# Patient Record
Sex: Male | Born: 1955
Health system: Southern US, Community
[De-identification: ages and names within clinical notes are randomized; demographics above are authoritative.]

## PROBLEM LIST (undated history)

## (undated) DIAGNOSIS — Z836 Family history of other diseases of the respiratory system: Secondary | ICD-10-CM

## (undated) DIAGNOSIS — R0600 Dyspnea, unspecified: Secondary | ICD-10-CM

## (undated) DIAGNOSIS — E785 Hyperlipidemia, unspecified: Secondary | ICD-10-CM

## (undated) DIAGNOSIS — J189 Pneumonia, unspecified organism: Secondary | ICD-10-CM

## (undated) DIAGNOSIS — T7840XA Allergy, unspecified, initial encounter: Secondary | ICD-10-CM

## (undated) DIAGNOSIS — Z800 Family history of malignant neoplasm of digestive organs: Secondary | ICD-10-CM

## (undated) DIAGNOSIS — Z8042 Family history of malignant neoplasm of prostate: Secondary | ICD-10-CM

## (undated) DIAGNOSIS — J439 Emphysema, unspecified: Secondary | ICD-10-CM

## (undated) HISTORY — PX: FOREARM SURGERY: SHX651

## (undated) HISTORY — PX: HERNIA REPAIR: SHX51

## (undated) HISTORY — PX: NASAL SEPTUM SURGERY: SHX37

## (undated) HISTORY — PX: WRIST GANGLION EXCISION: SUR520

## (undated) HISTORY — DX: Family history of other diseases of the respiratory system: Z83.6

## (undated) HISTORY — DX: Family history of malignant neoplasm of prostate: Z80.42

## (undated) HISTORY — DX: Allergy, unspecified, initial encounter: T78.40XA

## (undated) HISTORY — DX: Emphysema, unspecified: J43.9

## (undated) HISTORY — PX: KNEE ARTHROTOMY: SHX5881

## (undated) HISTORY — DX: Family history of malignant neoplasm of digestive organs: Z80.0

---

## 2003-03-02 ENCOUNTER — Ambulatory Visit (HOSPITAL_COMMUNITY): Admission: RE | Admit: 2003-03-02 | Discharge: 2003-03-02 | Payer: Self-pay | Admitting: Gastroenterology

## 2006-08-06 ENCOUNTER — Ambulatory Visit (HOSPITAL_BASED_OUTPATIENT_CLINIC_OR_DEPARTMENT_OTHER): Admission: RE | Admit: 2006-08-06 | Discharge: 2006-08-06 | Payer: Self-pay | Admitting: *Deleted

## 2006-12-07 ENCOUNTER — Encounter: Admission: RE | Admit: 2006-12-07 | Discharge: 2006-12-07 | Payer: Self-pay | Admitting: Family Medicine

## 2010-08-19 NOTE — Op Note (Signed)
Shaun [name], Shaun [name]             ACCOUNT NO.:  [id number]   MEDICAL RECORD NO.:  [id number]          PATIENT TYPE:  AMB   LOCATION:  [facility]                         FACILITY:  [facility]   PHYSICIAN:  Jonne Ply, MD   DATE OF BIRTH:  [birth year]   DATE OF PROCEDURE:  08/06/2006  DATE OF DISCHARGE:                               OPERATIVE REPORT   PREOPERATIVE DIAGNOSIS:  Right inguinal hernia.   POSTOPERATIVE DIAGNOSIS:  Right inguinal hernia.   PROCEDURE:  Right inguinal hernia repair with mesh.   SURGEON:  Glean Hess, M.D.   ANESTHESIA:  General.   DESCRIPTION:  The patient was taken to the operating room and after  adequate general anesthesia was induced using endotracheal tube, the  abdomen and right groin were prepped and draped in normal sterile  fashion.  An oblique incision was made over the inguinal canal.  I  dissected down using Bovie electrocautery onto the external oblique  fascia.  This was opened down to the external ring.  Ilioinguinal nerve  was identified and preserved, retracted laterally.  The spermatic cord  was then surrounded with a Penrose drain at the level of the pubic  tubercle.  A direct hernia defect was easily visualized.  A relaxing  incision was made in the transversalis fascia.  A primary closure was  accomplished in a Lichtenstein fashion with interrupted 0-0 Surgilon  sutures.  [place] Atrium mesh was then placed 3 inches x 6 inches starting  at the pubic tubercle and split and brought out lateral to the internal  ring.  This was fixed to the Cooper's ligament and inguinal ligament  using running 2-0 Prolene suture into the transversalis fascia  superiorly.  This was fixed out laterally to the internal ring.  The  ilioinguinal nerve was returned to its normal anatomic position.  The  external oblique fascia was closed with a running 3-0 Vicryl suture.  Skin was closed with staples.  All tissues were injected with 0.5  Marcaine.  The patient  tolerated the procedure well went to PACU in good  condition.      Jonne Ply, MD  Electronically Signed     KRE/MEDQ  D:  08/06/2006  T:  08/06/2006  Job:  [id number]

## 2010-08-19 NOTE — Op Note (Signed)
NAME:  AHKEEM, GOEDE                       ACCOUNT NO.:  [id number]   MEDICAL RECORD NO.:  [id number]                   PATIENT TYPE:  AMB   LOCATION:  ENDO                                 FACILITY:  [facility]   PHYSICIAN:  Earle Gell, M.D.                DATE OF BIRTH:  February 23, 1956   DATE OF PROCEDURE:  DATE OF DISCHARGE:                                 OPERATIVE REPORT   PROCEDURE:  Colonoscopy and polypectomy.   PROCEDURE INDICATION:  Mr. Wendy Mikles is a 55 year old male born  March 03, 1956.  Mr. Meek' mother was diagnosed with colon cancer in  her late 58s.  Mr. Pipe is scheduled to undergo his first screening  colonoscopy with polypectomy to prevent colon cancer.   ENDOSCOPIST:  Earle Gell, M.D.   PREMEDICATION:  Versed 5 mg, Demerol 50 mg.   DESCRIPTION OF PROCEDURE:  After obtaining informed consent, Mr. Thrun was  placed in the left lateral decubitus position.  I administered intravenous  Demerol and intravenous Versed to achieve conscious sedation for the  procedure.  The patient's blood pressure, oxygen saturation, and cardiac  rhythm were monitored throughout the procedure and documented in the medical  record.   Anal inspection was normal, digital rectal exam was normal and the prostate  was non nodular.  The Olympus adjustable pediatric video colonoscope was  introduced into the rectum and advanced to the cecum.  A normal-appearing  ileocecal valve was intubated and the distal ileum inspected.  The colonic  preparation for the exam today was excellent.   Rectum:  Normal.   Sigmoid colon and descending colon:  Normal.   Splenic flexure:  Normal.   Transverse colon:  Normal.   Hepatic flexure:  Normal.   Ascending colon:  Normal.   Cecum and ileocecal valve:  Normal.   Distal ileum:  Normal.   ASSESSMENT:  Normal screening proctocolonoscopy to the cecum.   RECOMMENDATION:  Repeat colonoscopy in five years.                             Earle Gell, M.D.    MJ/MEDQ  D:  03/02/2003  T:  03/02/2003  Job:  [id number]   cc:   Osvaldo Human, M.D.  [address] [place]  [place] [address]  Fax: [phone]

## 2014-03-31 ENCOUNTER — Other Ambulatory Visit: Payer: Self-pay | Admitting: Family Medicine

## 2014-03-31 DIAGNOSIS — R1031 Right lower quadrant pain: Secondary | ICD-10-CM

## 2014-04-06 ENCOUNTER — Other Ambulatory Visit: Payer: Self-pay | Admitting: Family Medicine

## 2014-04-06 ENCOUNTER — Ambulatory Visit
Admission: RE | Admit: 2014-04-06 | Discharge: 2014-04-06 | Disposition: A | Discharge status: Home / Self Care | Payer: Self-pay | Source: Ambulatory Visit | Attending: Family Medicine | Admitting: Family Medicine

## 2014-04-06 DIAGNOSIS — R1031 Right lower quadrant pain: Secondary | ICD-10-CM

## 2014-04-23 ENCOUNTER — Other Ambulatory Visit: Payer: Self-pay | Admitting: Gastroenterology

## 2014-06-22 ENCOUNTER — Encounter (HOSPITAL_COMMUNITY): Payer: Self-pay

## 2014-06-22 ENCOUNTER — Ambulatory Visit (HOSPITAL_COMMUNITY): Admit: 2014-06-22 | Payer: Self-pay | Admitting: Gastroenterology

## 2014-06-22 SURGERY — COLONOSCOPY WITH PROPOFOL
Anesthesia: Monitor Anesthesia Care

## 2014-09-11 ENCOUNTER — Encounter (HOSPITAL_COMMUNITY): Payer: Self-pay | Admitting: *Deleted

## 2014-09-21 ENCOUNTER — Ambulatory Visit (HOSPITAL_COMMUNITY)
Admission: RE | Admit: 2014-09-21 | Discharge: 2014-09-21 | Disposition: A | Discharge status: Home / Self Care | Payer: BLUE CROSS/BLUE SHIELD | Source: Ambulatory Visit | Attending: Gastroenterology | Admitting: Gastroenterology

## 2014-09-21 ENCOUNTER — Ambulatory Visit (HOSPITAL_COMMUNITY): Payer: BLUE CROSS/BLUE SHIELD | Admitting: Certified Registered Nurse Anesthetist

## 2014-09-21 ENCOUNTER — Encounter (HOSPITAL_COMMUNITY): Payer: Self-pay | Admitting: *Deleted

## 2014-09-21 ENCOUNTER — Encounter (HOSPITAL_COMMUNITY)
Admission: RE | Disposition: A | Discharge status: Home / Self Care | Payer: Self-pay | Source: Ambulatory Visit | Attending: Gastroenterology

## 2014-09-21 DIAGNOSIS — D122 Benign neoplasm of ascending colon: Secondary | ICD-10-CM | POA: Diagnosis not present

## 2014-09-21 DIAGNOSIS — Z1211 Encounter for screening for malignant neoplasm of colon: Secondary | ICD-10-CM | POA: Insufficient documentation

## 2014-09-21 DIAGNOSIS — Z800 Family history of malignant neoplasm of digestive organs: Secondary | ICD-10-CM | POA: Insufficient documentation

## 2014-09-21 HISTORY — DX: Hyperlipidemia, unspecified: E78.5

## 2014-09-21 HISTORY — PX: COLONOSCOPY WITH PROPOFOL: SHX5780

## 2014-09-21 SURGERY — COLONOSCOPY WITH PROPOFOL
Anesthesia: Monitor Anesthesia Care

## 2014-09-21 MED ORDER — PROPOFOL 10 MG/ML IV BOLUS
INTRAVENOUS | Status: AC
  Filled 2014-09-21: qty 20

## 2014-09-21 MED ORDER — LACTATED RINGERS IV SOLN
INTRAVENOUS | Status: DC | PRN
  Administered 2014-09-21: 10:00:00 via INTRAVENOUS

## 2014-09-21 MED ORDER — PROPOFOL 10 MG/ML IV BOLUS
INTRAVENOUS | Status: DC | PRN
  Administered 2014-09-21 (×4): 40 mg via INTRAVENOUS
  Administered 2014-09-21: 60 mg via INTRAVENOUS
  Administered 2014-09-21: 20 mg via INTRAVENOUS

## 2014-09-21 MED ORDER — SODIUM CHLORIDE 0.9 % IV SOLN: INTRAVENOUS | Status: DC

## 2014-09-21 MED ORDER — LIDOCAINE HCL (CARDIAC) 20 MG/ML IV SOLN
INTRAVENOUS | Status: DC | PRN
  Administered 2014-09-21: 75 mg via INTRAVENOUS

## 2014-09-21 MED ORDER — LACTATED RINGERS IV SOLN
INTRAVENOUS | Status: DC
  Administered 2014-09-21: 1000 mL via INTRAVENOUS

## 2014-09-21 SURGICAL SUPPLY — 22 items

## 2014-09-21 NOTE — Anesthesia Postprocedure Evaluation (Signed)
  Anesthesia Post-op Note  Patient: Shaun [name]  Procedure(s) Performed: Procedure(s) (LRB): COLONOSCOPY WITH PROPOFOL (N/A)  Patient Location: PACU  Anesthesia Type: MAC  Level of Consciousness: awake and alert   Airway and Oxygen Therapy: Patient Spontanous Breathing  Post-op Pain: mild  Post-op Assessment: Post-op Vital signs reviewed, Patient's Cardiovascular Status Stable, Respiratory Function Stable, Patent Airway and No signs of Nausea or vomiting  Last Vitals:  Filed Vitals:   09/21/14 1120  BP: 130/77  Pulse: 88  Temp:   Resp: 16    Post-op Vital Signs: stable   Complications: No apparent anesthesia complications

## 2014-09-21 NOTE — Discharge Instructions (Signed)
Colonoscopy, Care After These instructions give you information on caring for yourself after your procedure. Your doctor may also give you more specific instructions. Call your doctor if you have any problems or questions after your procedure. HOME CARE  Do not drive for 24 hours.  Do not sign important papers or use machinery for 24 hours.  You may shower.  You may go back to your usual activities, but go slower for the first 24 hours.  Take rest breaks often during the first 24 hours.  Walk around or use warm packs on your belly (abdomen) if you have belly cramping or gas.  Drink enough fluids to keep your pee (urine) clear or pale yellow.  Resume your normal diet. Avoid heavy or fried foods.  Avoid drinking alcohol for 24 hours or as told by your doctor.  Only take medicines as told by your doctor. If a tissue sample (biopsy) was taken during the procedure:   Do not take aspirin or blood thinners for 7 days, or as told by your doctor.  Do not drink alcohol for 7 days, or as told by your doctor.  Eat soft foods for the first 24 hours. GET HELP IF: You still have a small amount of blood in your poop (stool) 2-3 days after the procedure. GET HELP RIGHT AWAY IF:  You have more than a small amount of blood in your poop.  You see clumps of tissue (blood clots) in your poop.  Your belly is puffy (swollen).  You feel sick to your stomach (nauseous) or throw up (vomit).  You have a fever.  You have belly pain that gets worse and medicine does not help. MAKE SURE YOU:  Understand these instructions.  Will watch your condition.  Will get help right away if you are not doing well or get worse. Document Released: 04/22/2010 Document Revised: 03/25/2013 Document Reviewed: 11/25/2012 [facility] Patient Information 2015 [place], [place]. This information is not intended to replace advice given to you by your health care provider. Make sure you discuss any questions you have with  your health care provider.

## 2014-09-21 NOTE — Op Note (Signed)
Procedure: Screening colonoscopy. Normal screening colonoscopy performed on 03/02/2003. Mother diagnosed with colon cancer at age 59.  Endoscopist: Earle Gell  Premedication: Propofol administered by anesthesia  Procedure: The patient was placed in left lateral decubitus position. Anal inspection and digital rectal exam were normal. The Pentax pediatric colonoscope was introduced into the rectum and advanced to the cecum. A normal-appearing appendiceal orifice and ileocecal valve were identified. Colonic preparation for the exam today was good. Withdrawal time was 16 minutes.  Rectum. Normal. Retroflexed view of the distal rectum was normal  Sigmoid colon and descending colon. Normal  Splenic flexure. Normal  Transverse colon. Normal  Hepatic flexure. Normal  Ascending colon. From the distal ascending colon, a 6 mm sessile serrated appearing polyp was lifted by saline injection and removed with the electrocautery snare. The post polypectomy site was closed with an Endo Clip.  Cecum and ileocecal valve. Normal  Assessment: A small polyp was removed from the distal ascending colon with the electrocautery. Otherwise normal colonoscopy.  Recommendation: Schedule repeat colonoscopy in 5 years

## 2014-09-21 NOTE — H&P (Signed)
  Procedure: Screening colonoscopy. Normal screening colonoscopy performed on 03/02/2003. Mother diagnosed with colon cancer at age 59  History: The patient is a 59 year old male born 22-Oct-1955. He is scheduled to undergo a screening colonoscopy with polypectomy to prevent colon cancer.  Past medical history: Hypercholesterolemia. Right inguinal hernia repair. Deviated septum surgery. Left knee surgery. Left wrist surgery.  Medication allergies: Penicillin causes rash  Exam: The patient is alert and lying comfortably on the endoscopy stretcher. Abdomen is soft and nontender to palpation. Lungs are clear to auscultation.  Plan: Proceed with screening colonoscopy

## 2014-09-21 NOTE — Transfer of Care (Signed)
Immediate Anesthesia Transfer of Care Note  Patient: Shaun [name]  Procedure(s) Performed: Procedure(s): COLONOSCOPY WITH PROPOFOL (N/A)  Patient Location: PACU and Endoscopy Unit  Anesthesia Type:MAC  Level of Consciousness: awake, alert , oriented and patient cooperative  Airway & Oxygen Therapy: Patient Spontanous Breathing and Patient connected to face mask oxygen  Post-op Assessment: Report given to RN, Post -op Vital signs reviewed and stable and Patient moving all extremities  Post vital signs: Reviewed and stable  Last Vitals:  Filed Vitals:   09/21/14 0852  Temp: 37 C  Resp: 10    Complications: No apparent anesthesia complications

## 2014-09-21 NOTE — Anesthesia Preprocedure Evaluation (Signed)
Anesthesia Evaluation  Patient identified by MRN, date of birth, ID band Patient awake    Reviewed: Allergy & Precautions, NPO status , Patient's Chart, lab work & pertinent test results  Airway Mallampati: II  TM Distance: >3 FB Neck ROM: Full    Dental no notable dental hx.    Pulmonary neg pulmonary ROS,  breath sounds clear to auscultation  Pulmonary exam normal       Cardiovascular negative cardio ROS Normal cardiovascular examRhythm:Regular Rate:Normal     Neuro/Psych negative neurological ROS  negative psych ROS   GI/Hepatic negative GI ROS, Neg liver ROS,   Endo/Other  negative endocrine ROS  Renal/GU negative Renal ROS  negative genitourinary   Musculoskeletal negative musculoskeletal ROS (+)   Abdominal   Peds negative pediatric ROS (+)  Hematology negative hematology ROS (+)   Anesthesia Other Findings   Reproductive/Obstetrics negative OB ROS                             Anesthesia Physical Anesthesia Plan  ASA: I  Anesthesia Plan: MAC   Post-op Pain Management:    Induction:   Airway Management Planned: Simple Face Mask  Additional Equipment:   Intra-op Plan:   Post-operative Plan:   Informed Consent: I have reviewed the patients History and Physical, chart, labs and discussed the procedure including the risks, benefits and alternatives for the proposed anesthesia with the patient or authorized representative who has indicated his/her understanding and acceptance.   Dental advisory given  Plan Discussed with: CRNA  Anesthesia Plan Comments:         Anesthesia Quick Evaluation

## 2014-09-22 ENCOUNTER — Encounter (HOSPITAL_COMMUNITY): Payer: Self-pay | Admitting: Gastroenterology

## 2015-05-05 ENCOUNTER — Ambulatory Visit
Admission: RE | Admit: 2015-05-05 | Discharge: 2015-05-05 | Disposition: A | Discharge status: Home / Self Care | Payer: BLUE CROSS/BLUE SHIELD | Source: Ambulatory Visit | Attending: Family Medicine | Admitting: Family Medicine

## 2015-05-05 ENCOUNTER — Other Ambulatory Visit: Payer: Self-pay | Admitting: Family Medicine

## 2015-05-05 DIAGNOSIS — R0989 Other specified symptoms and signs involving the circulatory and respiratory systems: Secondary | ICD-10-CM

## 2015-06-29 ENCOUNTER — Ambulatory Visit
Admission: RE | Admit: 2015-06-29 | Discharge: 2015-06-29 | Disposition: A | Discharge status: Home / Self Care | Payer: BLUE CROSS/BLUE SHIELD | Source: Ambulatory Visit | Attending: Family Medicine | Admitting: Family Medicine

## 2015-06-29 ENCOUNTER — Other Ambulatory Visit: Payer: Self-pay | Admitting: Family Medicine

## 2015-06-29 DIAGNOSIS — R0989 Other specified symptoms and signs involving the circulatory and respiratory systems: Secondary | ICD-10-CM

## 2015-07-01 ENCOUNTER — Other Ambulatory Visit: Payer: Self-pay | Admitting: Family Medicine

## 2015-07-01 DIAGNOSIS — J849 Interstitial pulmonary disease, unspecified: Secondary | ICD-10-CM

## 2015-07-08 ENCOUNTER — Ambulatory Visit
Admission: RE | Admit: 2015-07-08 | Discharge: 2015-07-08 | Disposition: A | Discharge status: Home / Self Care | Payer: BLUE CROSS/BLUE SHIELD | Source: Ambulatory Visit | Attending: Family Medicine | Admitting: Family Medicine

## 2015-07-08 DIAGNOSIS — R918 Other nonspecific abnormal finding of lung field: Secondary | ICD-10-CM | POA: Diagnosis not present

## 2015-07-08 DIAGNOSIS — J849 Interstitial pulmonary disease, unspecified: Secondary | ICD-10-CM

## 2015-07-08 MED ORDER — IOPAMIDOL (ISOVUE-300) INJECTION 61%
75.0000 mL | Freq: Once | INTRAVENOUS | Status: AC | PRN
  Administered 2015-07-08: 75 mL via INTRAVENOUS

## 2015-07-23 ENCOUNTER — Other Ambulatory Visit: Payer: BLUE CROSS/BLUE SHIELD

## 2015-07-23 ENCOUNTER — Encounter: Payer: Self-pay | Admitting: Emergency Medicine

## 2015-07-23 ENCOUNTER — Ambulatory Visit (INDEPENDENT_AMBULATORY_CARE_PROVIDER_SITE_OTHER): Payer: BLUE CROSS/BLUE SHIELD | Admitting: Emergency Medicine

## 2015-07-23 VITALS — BP 118/88 | HR 90 | Ht 67.0 in | Wt 153.0 lb

## 2015-07-23 DIAGNOSIS — J849 Interstitial pulmonary disease, unspecified: Secondary | ICD-10-CM | POA: Diagnosis not present

## 2015-07-23 DIAGNOSIS — J8409 Other alveolar and parieto-alveolar conditions: Secondary | ICD-10-CM

## 2015-07-23 DIAGNOSIS — J309 Allergic rhinitis, unspecified: Secondary | ICD-10-CM | POA: Diagnosis not present

## 2015-07-23 NOTE — Progress Notes (Signed)
Subjective:    Patient ID: Shaun [name], male    DOB: [birth year], 60 y.o.   MRN: [id number]  HPI 60 yo man, hx former tobacco (minimal hx), has worked as a Games developer. Has felt well.  He had rales on exam that prompted imaging.  CT scan of the chest performed on 07/08/15 shows interstitial lung disease with some areas of traction bronchiectasis, honeycomb change, most prominent at the apices and at both bases. There is a large left lower lobe bullous. Not clear to me that there is any active groundglass infiltrate. He also has some hilar and mediastinal lymphadenopathy.  Denies any SOB, still able to walk and exercise. Has a cough, he ascribes to allergies. No aspiration sx. No candidate prescription meds that would cause I:LD.  Of note his father had pulmonary fibrosis   Review of Systems  Constitutional: Negative for fever and unexpected weight change.  HENT: Positive for congestion and sneezing. Negative for dental problem, ear pain, nosebleeds, postnasal drip, rhinorrhea, sinus pressure, sore throat and trouble swallowing.   Eyes: Negative for redness and itching.  Respiratory: Negative for cough, chest tightness, shortness of breath and wheezing.   Cardiovascular: Negative for palpitations and leg swelling.  Gastrointestinal: Negative for nausea and vomiting.  Genitourinary: Negative for dysuria.  Musculoskeletal: Negative for joint swelling.  Skin: Negative for rash.  Neurological: Negative for headaches.  Hematological: Does not bruise/bleed easily.  Psychiatric/Behavioral: Negative for dysphoric mood. The patient is not nervous/anxious.     Past Medical History  Diagnosis Date  . Hyperlipemia      Family history His father had pulmonary fibrosis  Social History   Social History  . Marital Status: Married    Spouse Name: N/A  . Number of Children: N/A  . Years of Education: N/A   Occupational History  . Not on file.   Social History Main Topics  . Smoking status:  Former Research scientist (life sciences)  . Smokeless tobacco: Not on file  . Alcohol Use: No  . Drug Use: No  . Sexual Activity: Not on file   Other Topics Concern  . 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   Allergies  Allergen Reactions  . Penicillins     Childhood allergy     Outpatient Prescriptions Prior to Visit  Medication Sig Dispense Refill  . Multiple Vitamin (MULTIVITAMIN WITH MINERALS) TABS tablet Take 1 tablet by mouth daily.    . simvastatin (ZOCOR) 10 MG tablet Take 10 mg by mouth daily.   0  . chlorpheniramine (CHLOR-TRIMETON) 4 MG tablet Take 4 mg by mouth 2 (two) times daily as needed for allergies.    . pseudoephedrine (SUDAFED) 30 MG tablet Take 30 mg by mouth every 4 (four) hours as needed for congestion.     No facility-administered medications prior to visit.         Objective:   Physical Exam  Filed Vitals:   07/23/15 1522 07/23/15 1523  BP:  118/88  Pulse:  90  Height: _0  (1.702 m)   Weight: 153 lb (69.4 kg)   SpO2:  96%    Gen: Pleasant, well-nourished, in no distress,  normal affect  ENT: No lesions,  mouth clear,  oropharynx clear, no postnasal drip  Neck: No JVD, no TMG, no carotid bruits  Lungs: No use of accessory muscles, decreased at the left base with few inspiratory crackles, better  air movement on the right but with louder crackles halfway up the right lung field, no wheezes  Cardiovascular: RRR, heart sounds normal, no murmur or gallops, no peripheral edemal  Musculoskeletal: No deformities, no cyanosis or clubbing  Neuro: alert, non focal  Skin: Warm, no lesions or rashes      Assessment & Plan:  Interstitial lung disease ([place]) Etiology unclear. He denies any occupational exposures that typically cause ILD. He does not have any known prior medications that could be culprits. Certainly he may have a systemic inflammatory process that is presenting initially  with interstitial disease such as rheumatoid arthritis, scleroderma, lupus. He does not have any associated symptoms. No evidence for chronic aspiration. I discussed with him the potential for lung biopsy or possibly intrabronchial ultrasound to perform nodal biopsy. This could be sarcoidosis or hypersensitivity pneumonitis although again he has no specific exposures that I could elicit. I believe he needs screening autoimmune labs and pulmonary function testing. We will then review and decide whether it would be appropriate to pursue lung biopsy or nodal biopsies.   Baltazar Apo, MD, PhD 07/23/2015, 3:58 PM [facility] [phone] or if no answer [phone]

## 2015-07-23 NOTE — Patient Instructions (Signed)
Try starting loratadine 10 mg daily for your nasal congestion We will perform lab work and full breathing tests to evaluate the interstitial changes that we see on her CT scan of the chest.  Depending on the results of our evaluation we may decide to recommend lung biopsy or lymph node biopsy to further evaluate. We will arrange for follow-up visit with you at our next available opening after lab work is available.

## 2015-07-23 NOTE — Assessment & Plan Note (Signed)
Etiology unclear. He denies any occupational exposures that typically cause ILD. He does not have any known prior medications that could be culprits. Certainly he may have a systemic inflammatory process that is presenting initially with interstitial disease such as rheumatoid arthritis, scleroderma, lupus. He does not have any associated symptoms. No evidence for chronic aspiration. I discussed with him the potential for lung biopsy or possibly intrabronchial ultrasound to perform nodal biopsy. This could be sarcoidosis or hypersensitivity pneumonitis although again he has no specific exposures that I could elicit. I believe he needs screening autoimmune labs and pulmonary function testing. We will then review and decide whether it would be appropriate to pursue lung biopsy or nodal biopsies.

## 2015-07-23 NOTE — Assessment & Plan Note (Signed)
Try starting loratadine 10 mg daily

## 2015-07-25 LAB — RHEUMATOID FACTOR

## 2015-07-25 LAB — ANTI-JO 1 ANTIBODY, IGG: Anti JO-1: <0.2 AI (ref 0.0–0.9)

## 2015-07-26 LAB — ANA: Anti Nuclear Antibody(ANA): NEGATIVE

## 2015-07-26 LAB — ALDOLASE: Aldolase: 7 U/L (ref <=8.1)

## 2015-07-26 LAB — CYCLIC CITRUL PEPTIDE ANTIBODY, IGG: Cyclic Citrullin Peptide Ab: <16 Units

## 2015-07-26 LAB — SJOGREN'S SYNDROME ANTIBODS(SSA + SSB)
SSA (RO) (ENA) ANTIBODY, IGG: NEGATIVE
SSB (LA) (ENA) ANTIBODY, IGG: NEGATIVE

## 2015-07-26 LAB — RNP ANTIBODY: RIBONUCLEIC PROTEIN(ENA) ANTIBODY, IGG: NEGATIVE

## 2015-07-26 LAB — ANTI-SMITH ANTIBODY: ENA SM AB SER-ACNC: NEGATIVE

## 2015-07-26 LAB — ANTI-DNA ANTIBODY, DOUBLE-STRANDED: ds DNA Ab: 1 IU/mL

## 2015-07-26 LAB — ANTI-SCLERODERMA ANTIBODY: Scleroderma (Scl-70) (ENA) Antibody, IgG: <1

## 2015-07-29 LAB — ALPHA-1 ANTITRYPSIN PHENOTYPE: A-1 Antitrypsin: 144 mg/dL (ref 83–199)

## 2015-08-02 ENCOUNTER — Ambulatory Visit (HOSPITAL_COMMUNITY)
Admission: RE | Admit: 2015-08-02 | Discharge: 2015-08-02 | Disposition: A | Discharge status: Home / Self Care | Payer: BLUE CROSS/BLUE SHIELD | Source: Ambulatory Visit | Attending: Emergency Medicine | Admitting: Emergency Medicine

## 2015-08-02 DIAGNOSIS — J849 Interstitial pulmonary disease, unspecified: Secondary | ICD-10-CM | POA: Diagnosis not present

## 2015-08-02 LAB — PULMONARY FUNCTION TEST
DL/VA % PRED: 67 %
DL/VA: 2.99 ml/min/mmHg/L
DLCO UNC: 17.33 ml/min/mmHg
DLCO unc % pred: 61 %
FEF 25-75 POST: 2.64 L/s
FEF 25-75 Pre: 1.74 L/sec
FEF2575-%Change-Post: 51 %
FEF2575-%Pred-Post: 98 %
FEF2575-%Pred-Pre: 64 %
FEV1-%Change-Post: 42 %
FEV1-%PRED-PRE: 65 %
FEV1-%Pred-Post: 93 %
FEV1-POST: 3 L
FEV1-PRE: 2.1 L
FEV1FVC-%CHANGE-POST: 40 %
FEV1FVC-%Pred-Pre: 69 %
FEV6-%Change-Post: 2 %
FEV6-%Pred-Post: 98 %
FEV6-%Pred-Pre: 95 %
FEV6-POST: 3.99 L
FEV6-Pre: 3.89 L
FEV6FVC-%PRED-POST: 105 %
FEV6FVC-%PRED-PRE: 105 %
FVC-%Change-Post: 2 %
FVC-%PRED-PRE: 93 %
FVC-%Pred-Post: 95 %
FVC-POST: 4.08 L
FVC-PRE: 4 L
POST FEV6/FVC RATIO: 100 %
PRE FEV1/FVC RATIO: 53 %
PRE FEV6/FVC RATIO: 100 %
Post FEV1/FVC ratio: 74 %
RV % PRED: 92 %
RV: 1.92 L
TLC % pred: 91 %
TLC: 5.88 L

## 2015-08-02 MED ORDER — ALBUTEROL SULFATE (2.5 MG/3ML) 0.083% IN NEBU
2.5000 mg | INHALATION_SOLUTION | Freq: Once | RESPIRATORY_TRACT | Status: AC
  Administered 2015-08-02: 2.5 mg via RESPIRATORY_TRACT

## 2015-08-11 ENCOUNTER — Telehealth: Payer: Self-pay | Admitting: Emergency Medicine

## 2015-08-11 NOTE — Telephone Encounter (Signed)
Spoke with the pt  Looks like he just needs ov to discuss test results  Last AVS:   Patient Instructions     Try starting loratadine 10 mg daily for your nasal congestion We will perform lab work and full breathing tests to evaluate the interstitial changes that we see on her CT scan of the chest.  Depending on the results of our evaluation we may decide to recommend lung biopsy or lymph node biopsy to further evaluate. We will arrange for follow-up visit with you at our next available opening after lab work is available.   RB, you have no openings until end of June- ok to wait until then or overbook to see him? Please advise, thanks

## 2015-08-12 NOTE — Telephone Encounter (Signed)
Pt scheduled for 08/18/15 at 1:30. Nothing further needed.

## 2015-08-12 NOTE — Telephone Encounter (Signed)
Yes  Ok to [place] him thanks

## 2015-08-18 ENCOUNTER — Other Ambulatory Visit: Payer: BLUE CROSS/BLUE SHIELD

## 2015-08-18 ENCOUNTER — Encounter: Payer: Self-pay | Admitting: Emergency Medicine

## 2015-08-18 ENCOUNTER — Ambulatory Visit (INDEPENDENT_AMBULATORY_CARE_PROVIDER_SITE_OTHER): Payer: BLUE CROSS/BLUE SHIELD | Admitting: Emergency Medicine

## 2015-08-18 VITALS — BP 106/72 | HR 90 | Wt 152.0 lb

## 2015-08-18 DIAGNOSIS — J849 Interstitial pulmonary disease, unspecified: Secondary | ICD-10-CM

## 2015-08-18 DIAGNOSIS — J45909 Unspecified asthma, uncomplicated: Secondary | ICD-10-CM | POA: Diagnosis not present

## 2015-08-18 MED ORDER — ALBUTEROL SULFATE HFA 108 (90 BASE) MCG/ACT IN AERS: 2.0000 | INHALATION_SPRAY | Freq: Four times a day (QID) | RESPIRATORY_TRACT | Status: DC | PRN

## 2015-08-18 NOTE — Assessment & Plan Note (Signed)
Significant obstruction with bronchodilator response identified on his spirometry from his recent pulmonary function testing. He is asymptomatic but I do want him to learn to use albuterol when necessary.

## 2015-08-18 NOTE — Assessment & Plan Note (Signed)
There are scattered cystic changes and some evidence of bullous disease interspersed with his interstitial lung disease and apparent honeycomb change. These findings plus the obstruction on his palmar function testing by me wonder about a primary vasculitis or other cystic lung disease. Question whether this may be langerhans cell histocytosis versus, Churg-Strauss or vasculitis. He may ultimately require lung biopsy. Bronchoscopy may be indicated prior to more invasive testing. I will follow up ANCA, repeat his CT chest in 3 months, determine next steps.

## 2015-08-18 NOTE — Progress Notes (Signed)
Subjective:    Patient ID: Shaun [name], male    DOB: [birth year], 60 y.o.   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  He had rales on exam that prompted imaging.  CT scan of the chest performed on 07/08/15 shows interstitial lung disease with some areas of traction bronchiectasis, honeycomb change, most prominent at the apices and at both bases. There is a large left lower lobe bullous. Not clear to me that there is any active groundglass infiltrate. He also has some hilar and mediastinal lymphadenopathy.  Denies any SOB, still able to walk and exercise. Has a cough, he ascribes to allergies. No aspiration sx. No candidate prescription meds that would cause I:LD.  Of note his father had pulmonary fibrosis  ROV 08/18/15 -- follow-up visit for dyspnea that is associated with some traction bronchiectasis and UIP changes on CT scan of the chest as well as some areas of emphysematous change. He returns for follow-up. He had autoimmune serologies that I have reviewed which are all negative. Pulmonary function testing done on 08/02/15 shows moderately severe obstruction with a vigorous bronchodilator response, normal lung volumes and decreased diffusion capacity. He is able to exert, walk, ride bike.    Review of Systems  Constitutional: Negative for fever and unexpected weight change.  HENT: Positive for congestion and sneezing. Negative for dental problem, ear pain, nosebleeds, postnasal drip, rhinorrhea, sinus pressure, sore throat and trouble swallowing.   Eyes: Negative for redness and itching.  Respiratory: Negative for cough, chest tightness, shortness of breath and wheezing.   Cardiovascular: Negative for palpitations and leg swelling.  Gastrointestinal: Negative for nausea and vomiting.  Genitourinary: Negative for dysuria.  Musculoskeletal: Negative for joint swelling.  Skin: Negative for rash.  Neurological: Negative for  headaches.  Hematological: Does not bruise/bleed easily.  Psychiatric/Behavioral: Negative for dysphoric mood. The patient is not nervous/anxious.     Past Medical History  Diagnosis Date  . Hyperlipemia      Family history His father had pulmonary fibrosis  Social History   Social History  . Marital Status: Married    Spouse Name: N/A  . Number of Children: N/A  . Years of Education: N/A   Occupational History  . Not on file.   Social History Main Topics  . Smoking status: Never Smoker   . Smokeless tobacco: Not on file  . Alcohol Use: No  . Drug Use: No  . Sexual Activity: Not on file   Other Topics Concern  . 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   Allergies  Allergen Reactions  . Penicillins     Childhood allergy     Outpatient Prescriptions Prior to Visit  Medication Sig Dispense Refill  . aspirin 81 MG tablet Take 81 mg by mouth daily.    . Multiple Vitamin (MULTIVITAMIN WITH MINERALS) TABS tablet Take 1 tablet by mouth daily.    . simvastatin (ZOCOR) 10 MG tablet Take 10 mg by mouth daily.   0   No facility-administered medications prior to visit.         Objective:   Physical Exam  Filed Vitals:   08/18/15 1337  BP: 106/72  Pulse: 90  Weight: 152 lb (68.947 kg)  SpO2: 97%    Gen: Pleasant, well-nourished, in no distress,  normal affect  ENT:  No lesions,  mouth clear,  oropharynx clear, no postnasal drip  Neck: No JVD, no TMG, no carotid bruits  Lungs: No use of accessory muscles, decreased at the left base with few inspiratory crackles, better air movement on the right but with louder crackles halfway up the right lung field, no wheezes  Cardiovascular: RRR, heart sounds normal, no murmur or gallops, no peripheral edemal  Musculoskeletal: No deformities, no cyanosis or clubbing  Neuro: alert, non focal  Skin: Warm, no lesions or rashes        Assessment & Plan:  Asthma Significant obstruction with bronchodilator response identified on his spirometry from his recent pulmonary function testing. He is asymptomatic but I do want him to learn to use albuterol when necessary.   Interstitial lung disease ([place]) There are scattered cystic changes and some evidence of bullous disease interspersed with his interstitial lung disease and apparent honeycomb change. These findings plus the obstruction on his palmar function testing by me wonder about a primary vasculitis or other cystic lung disease. Question whether this may be langerhans cell histocytosis versus, Churg-Strauss or vasculitis. He may ultimately require lung biopsy. Bronchoscopy may be indicated prior to more invasive testing. I will follow up ANCA, repeat his CT chest in 3 months, determine next steps.    Baltazar Apo, MD, PhD 08/18/2015, 2:19 PM [facility] [phone] or if no answer [phone]

## 2015-08-18 NOTE — Patient Instructions (Addendum)
We will teach you how to use albuterol today. Use 2 puffs if needed for shortness of breath or prior to exertion to see if this helps your breathing. We will follow your blood work (ANCA) results We will repeat your CT scan of the chest in 3 months without contrast, high-resolution cuts Follow with Dr Lamonte Sakai in 4 months or sooner if you have any problems.

## 2015-08-19 LAB — RFLX P-ANCA TITER: P-ANCA: 1:20 {titer} — ABNORMAL HIGH

## 2015-08-19 LAB — ANCA SCREEN W REFLEX TITER: ANCA Screen: POSITIVE — AB

## 2015-11-13 DIAGNOSIS — Z23 Encounter for immunization: Secondary | ICD-10-CM | POA: Diagnosis not present

## 2015-11-22 ENCOUNTER — Other Ambulatory Visit: Payer: BLUE CROSS/BLUE SHIELD

## 2015-11-22 DIAGNOSIS — R7301 Impaired fasting glucose: Secondary | ICD-10-CM | POA: Diagnosis not present

## 2015-11-22 DIAGNOSIS — E7800 Pure hypercholesterolemia, unspecified: Secondary | ICD-10-CM | POA: Diagnosis not present

## 2015-11-22 DIAGNOSIS — J8489 Other specified interstitial pulmonary diseases: Secondary | ICD-10-CM | POA: Diagnosis not present

## 2015-11-23 ENCOUNTER — Ambulatory Visit
Admission: RE | Admit: 2015-11-23 | Discharge: 2015-11-23 | Disposition: A | Discharge status: Home / Self Care | Payer: BLUE CROSS/BLUE SHIELD | Source: Ambulatory Visit | Attending: Emergency Medicine | Admitting: Emergency Medicine

## 2015-11-23 DIAGNOSIS — J849 Interstitial pulmonary disease, unspecified: Secondary | ICD-10-CM | POA: Diagnosis not present

## 2015-11-26 ENCOUNTER — Encounter: Payer: Self-pay | Admitting: Emergency Medicine

## 2015-11-26 NOTE — Telephone Encounter (Signed)
HRCT done 8.22.17 to follow ILD Pt e-mail asking when he will be able to view the results from the aforementioned CT  Dr Lamonte Sakai please advise on results for pt, thank you.

## 2015-12-06 ENCOUNTER — Telehealth: Payer: Self-pay | Admitting: Emergency Medicine

## 2015-12-06 NOTE — Telephone Encounter (Signed)
Routing telephone note.

## 2015-12-06 NOTE — Telephone Encounter (Signed)
Call pt regarding CT chest, then address in Email

## 2015-12-07 NOTE — Telephone Encounter (Signed)
Spoke with the patient today. His CT scan of the chest shows continued basilar groundglass with some associated traction changes, mild honeycomb change, cystic areas that may be emphysema but may also reflect true cysts. He has a positive p-ANCA at low titer 1:20. I am concerned about GPA (Wegener's). I believe we need to confirm that his renal function is normal. If abnormal then he may be a candidate for renal biopsy. If normal then I think we need to strongly consider going for VATS lung biopsy. It may be that he would benefit from bronchoscopy first. I will discuss this with him and also with TCTS  Patient had labs done with Dr. Serita Grammes recently. We need to get a copy of these labs to check his renal function. Thanks

## 2015-12-08 NOTE — Telephone Encounter (Signed)
Called Dr. Raul Del office and spoke with Jerene Pitch. She is going to fax over the labs that the pt had drawn on 11/22/15. Will await fax.

## 2015-12-08 NOTE — Telephone Encounter (Signed)
These labs were received. His kidney function was normal >> creatinine 0.89.  Per RB >> we need to call the pt and let him know that I am thinking that he will need to have a lung biopsy done. He has an upcoming appointment with me on 12/28/2015. We will discuss all of this at that appointment.  Spoke with pt. He is aware of the above information. His appointment date and time have been clarified with him. Nothing further was needed.

## 2015-12-28 ENCOUNTER — Ambulatory Visit (INDEPENDENT_AMBULATORY_CARE_PROVIDER_SITE_OTHER): Payer: BLUE CROSS/BLUE SHIELD | Admitting: Emergency Medicine

## 2015-12-28 ENCOUNTER — Encounter: Payer: Self-pay | Admitting: Emergency Medicine

## 2015-12-28 DIAGNOSIS — J849 Interstitial pulmonary disease, unspecified: Secondary | ICD-10-CM | POA: Diagnosis not present

## 2015-12-28 NOTE — Progress Notes (Signed)
Subjective:    Patient ID: Shaun [name], male    DOB: 30-Aug-1955, 60 y.o.   MRN: [id number]  HPI 60 yo man, hx former tobacco (minimal hx), has worked as a Games developer. Has felt well.  He had rales on exam that prompted imaging.  CT scan of the chest performed on 07/08/15 shows interstitial lung disease with some areas of traction bronchiectasis, honeycomb change, most prominent at the apices and at both bases. There is a large left lower lobe bullous. Not clear to me that there is any active groundglass infiltrate. He also has some hilar and mediastinal lymphadenopathy.  Denies any SOB, still able to walk and exercise. Has a cough, he ascribes to allergies. No aspiration sx. No candidate prescription meds that would cause I:LD.  Of note his father had pulmonary fibrosis  ROV 60/17/17 -- follow-up visit for dyspnea that is associated with some traction bronchiectasis and UIP changes on CT scan of the chest as well as some areas of emphysematous change. He returns for follow-up. He had autoimmune serologies that I have reviewed which are all negative. Pulmonary function testing done on 08/02/15 shows moderately severe obstruction with a vigorous bronchodilator response, normal lung volumes and decreased diffusion capacity. He is able to exert, walk, ride bike.   ROV 12/28/15 -- Patient has a history of mixed obstruction and restrictive disease. He has some scattered cystic changes and bullous changes with possible honeycomb on CT scan of the chest. Based on this we checked autoimmune studies and his p-ANCA was positive. No evidence of renal disease on BMP. We repeated his CT scan of the chest on 11/23/15 and I have personally reviewed this. Shows continued evidence for peripheral fibrotic changes, early honeycomb change, associated groundglass. Also noted are some subpleural nodular changes that have not changed since last study.   Review of Systems  Constitutional: Negative for fever and unexpected weight  change.  HENT: Positive for congestion and sneezing. Negative for dental problem, ear pain, nosebleeds, postnasal drip, rhinorrhea, sinus pressure, sore throat and trouble swallowing.   Eyes: Negative for redness and itching.  Respiratory: Negative for cough, chest tightness, shortness of breath and wheezing.   Cardiovascular: Negative for palpitations and leg swelling.  Gastrointestinal: Negative for nausea and vomiting.  Genitourinary: Negative for dysuria.  Musculoskeletal: Negative for joint swelling.  Skin: Negative for rash.  Neurological: Negative for headaches.  Hematological: Does not bruise/bleed easily.  Psychiatric/Behavioral: Negative for dysphoric mood. The patient is not nervous/anxious.     Past Medical History:  Diagnosis Date  . Hyperlipemia      Family history His father had pulmonary fibrosis  Social History   Social History  . Marital status: Married    Spouse name: N/A  . Number of children: N/A  . Years of education: N/A   Occupational History  . Not on file.   Social History Main Topics  . Smoking status: Never Smoker  . Smokeless tobacco: Not on file  . Alcohol use No  . Drug use: No  . Sexual activity: Not on file   Other Topics Concern  . Not on file   Social History Narrative  . 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  No [employer]  Now works office job.   Allergies  Allergen Reactions  . Penicillins     Childhood allergy     Outpatient Medications Prior to Visit  Medication Sig Dispense Refill  . albuterol (PROVENTIL HFA;VENTOLIN HFA)  108 (90 Base) MCG/ACT inhaler Inhale 2 puffs into the lungs every 6 (six) hours as needed for wheezing or shortness of breath. 1 Inhaler 5  . aspirin 81 MG tablet Take 81 mg by mouth daily.    . cetirizine (ZYRTEC ALLERGY) 10 MG tablet Take 10 mg by mouth daily.    . Multiple Vitamin (MULTIVITAMIN WITH MINERALS) TABS tablet Take 1 tablet by mouth daily.    . simvastatin  (ZOCOR) 10 MG tablet Take 10 mg by mouth daily.   0   No facility-administered medications prior to visit.          Objective:   Physical Exam  Vitals:   12/28/15 0943  BP: 118/82  Pulse: 81  SpO2: 96%  Weight: 155 lb 6.4 oz (70.5 kg)  Height: _0  (1.702 m)    Gen: Pleasant, well-nourished, in no distress,  normal affect  ENT: No lesions,  mouth clear,  oropharynx clear, no postnasal drip  Neck: No JVD, no TMG, no carotid bruits  Lungs: No use of accessory muscles, decreased at the left base with few inspiratory crackles, better air movement on the right but with louder crackles halfway up the right lung field, no wheezes  Cardiovascular: RRR, heart sounds normal, no murmur or gallops, no peripheral edemal  Musculoskeletal: No deformities, no cyanosis or clubbing  Neuro: alert, non focal  Skin: Warm, no lesions or rashes      Assessment & Plan:  Interstitial lung disease ([place]) Persists and has possibly since prior CT scan. p-ANCA is positive. I believe he needs a VATS bx in absence of any evidence for renal manifestations GPA. Will refer him to [facility]CTS  Baltazar Apo, MD, PhD 12/28/2015, 10:14 AM [facility] [phone] or if no answer [phone]

## 2015-12-28 NOTE — Patient Instructions (Signed)
We will refer you to see a thoracic surgeon to discuss a possible lung biopsy  Please follow with Dr Lamonte Sakai next available after your lung procedure.

## 2015-12-28 NOTE — Addendum Note (Signed)
Addended by: Mathis Dad on: 12/28/2015 10:17 AM   Modules accepted: Orders

## 2015-12-28 NOTE — Assessment & Plan Note (Signed)
Persists and has possibly since prior CT scan. p-ANCA is positive. I believe he needs a VATS bx in absence of any evidence for renal manifestations GPA. Will refer him to TCTS

## 2016-01-04 ENCOUNTER — Encounter: Payer: BLUE CROSS/BLUE SHIELD | Admitting: Thoracic Surgery (Cardiothoracic Vascular Surgery)

## 2016-01-05 ENCOUNTER — Institutional Professional Consult (permissible substitution) (INDEPENDENT_AMBULATORY_CARE_PROVIDER_SITE_OTHER): Payer: BLUE CROSS/BLUE SHIELD | Admitting: Thoracic Surgery (Cardiothoracic Vascular Surgery)

## 2016-01-05 ENCOUNTER — Other Ambulatory Visit: Payer: Self-pay | Admitting: *Deleted

## 2016-01-05 ENCOUNTER — Encounter: Payer: Self-pay | Admitting: Thoracic Surgery (Cardiothoracic Vascular Surgery)

## 2016-01-05 VITALS — BP 134/92 | HR 98 | Resp 16 | Ht 67.0 in | Wt 150.0 lb

## 2016-01-05 DIAGNOSIS — J849 Interstitial pulmonary disease, unspecified: Secondary | ICD-10-CM

## 2016-01-05 NOTE — Progress Notes (Signed)
PCP is Mayra Neer, MD Referring Provider is Collene Gobble, MD  Chief Complaint  Patient presents with  . Interstitial Lung Disease    eval for lung BX.Marland KitchenMarland KitchenCT CHEST 11/23/15    HPI: 60 year old man sent for consultation regarding a lung biopsy.  Shaun [name] is a 60 year old man with a past medical history significant for hyperlipidemia. Shaun [name] saw Dr. Brigitte Pulse in February for an annual exam. She noted abnormal of breath sounds in order to chest x-ray. Chest x-ray was abnormal as well as a follow-up. Shaun [name] was referred to Dr. Lamonte [name] who did a CT of the chest. It showed changes consistent with interstitial lung disease.  Shaun [name] recently had a follow-up CT which suggested mild interval progression. Pulmonary function testing showed some obstruction but an FEV1 of 2.100 (65%) which increased to 3.00 (93%).  Overall Shaun [name] feels fairly well. His appetite is good. Shaun [name] has not lost any weight. Shaun [name] has an occasional cough, but nothing out of the ordinary. Shaun [name] denies any wheezing and has not been using his rescue inhaler. Shaun [name] denies any chest pain, pressure, or tightness with exertion. Shaun [name] walked up to 4 flights of stairs to our office and did say Shaun [name] was short of breath when Shaun [name] got to the top.  Past Medical History:  Diagnosis Date  . Hyperlipemia     Past Surgical History:  Procedure Laterality Date  . COLONOSCOPY WITH PROPOFOL N/A 09/21/2014   Procedure: COLONOSCOPY WITH PROPOFOL;  Surgeon: Garlan Fair, MD;  Location: [facility];  Service: Endoscopy;  Laterality: N/A;  . FOREARM SURGERY     excision of birth mark"child"  . HERNIA REPAIR     RIH-open  . KNEE ARTHROTOMY Left    bursa excision  . NASAL SEPTUM SURGERY    . WRIST GANGLION EXCISION Left     Family history Father died of pulmonary fibrosis in his 57s Grandfather had emphysema Mother died of lung cancer  Social History Social History  Substance Use Topics  . Smoking status: Never Smoker  . Smokeless tobacco: Not on file  .  Alcohol use No    Current Outpatient Prescriptions  Medication Sig Dispense Refill  . albuterol (PROVENTIL HFA;VENTOLIN HFA) 108 (90 Base) MCG/ACT inhaler Inhale 2 puffs into the lungs every 6 (six) hours as needed for wheezing or shortness of breath. 1 Inhaler 5  . aspirin 81 MG tablet Take 81 mg by mouth daily.    . cetirizine (ZYRTEC ALLERGY) 10 MG tablet Take 10 mg by mouth daily.    . chlorpheniramine (CHLOR-TRIMETON) 2 MG/5ML syrup Take 2 mg by mouth every 4 (four) hours as needed for allergies.    . Multiple Vitamin (MULTIVITAMIN WITH MINERALS) TABS tablet Take 1 tablet by mouth daily.    . pseudoephedrine (SUDAFED) 60 MG tablet Take 60 mg by mouth every 4 (four) hours as needed for congestion.    . simvastatin (ZOCOR) 10 MG tablet Take 10 mg by mouth daily.   0   No current facility-administered medications for this visit.     Allergies  Allergen Reactions  . Penicillins     Childhood allergy    Review of Systems  Constitutional: Negative for activity change, appetite change, chills, fever and unexpected weight change.  HENT: Negative for voice change.   Eyes: Negative for visual disturbance.  Respiratory: Negative for cough, shortness of breath and wheezing.   Cardiovascular: Negative for chest pain and leg swelling.  Gastrointestinal: Negative for abdominal pain and blood in stool.  Genitourinary:  Negative for difficulty urinating and dysuria.  Musculoskeletal: Negative for arthralgias and joint swelling.  Neurological: Negative for seizures, syncope and weakness.  Hematological: Negative for adenopathy. Does not bruise/bleed easily.  All other systems reviewed and are negative.   BP (!) 134/92   Pulse 98   Resp 16   Ht _0  (1.702 m)   Wt 150 lb (68 kg)   SpO2 97% Comment: ON RA  BMI 23.49 kg/m  Physical Exam  Constitutional: Shaun [name] is oriented to person, place, and time. Shaun [name] appears well-developed and well-nourished. No distress.  HENT:  Head: Normocephalic and  atraumatic.  Mouth/Throat: No oropharyngeal exudate.  Eyes: Conjunctivae and EOM are normal. No scleral icterus.  Neck: Neck supple. No thyromegaly present.  Cardiovascular: Normal rate, regular rhythm, normal heart sounds and intact distal pulses.  Exam reveals no gallop and no friction rub.   No murmur heard. Pulmonary/Chest: Effort normal. No respiratory distress. Shaun [name] has rales (bibasilar, right greater than left).  Abdominal: Soft. Shaun [name] exhibits no distension. There is no tenderness.  Musculoskeletal: Shaun [name] exhibits no edema.  Lymphadenopathy:    Shaun [name] has no cervical adenopathy.  Neurological: Shaun [name] is alert and oriented to person, place, and time. No cranial nerve deficit.  No focal motor deficit  Skin: Skin is warm and dry.  Vitals reviewed.    Diagnostic Tests: CT CHEST WITHOUT CONTRAST  TECHNIQUE: Multidetector CT imaging of the chest was performed following the standard protocol without intravenous contrast. High resolution imaging of the lungs, as well as inspiratory and expiratory imaging, was performed.  COMPARISON:  07/08/2015 chest CT.  FINDINGS: Motion degraded scan.  Mediastinum/Nodes: Normal heart size. No significant pericardial fluid/thickening. Great vessels are normal in course and caliber. No discrete thyroid nodules. Unremarkable esophagus. No pathologically enlarged axillary, mediastinal or gross hilar lymph nodes, noting limited sensitivity for the detection of hilar adenopathy on this noncontrast study. Top-normal caliber right hilar, AP window and subcarinal nodes are stable.  Lungs/Pleura: No pneumothorax. No pleural effusion. Mild centrilobular and paraseptal emphysema. Posterior left lower lobe 5.9 cm bulla, not appreciably changed. No acute consolidative airspace disease. Subpleural 5 mm anterior right lower lobe solid pulmonary nodule (series 4/ image 67), stable. No lung masses or new significant pulmonary nodules. There is patchy confluent  subpleural reticulation, traction bronchiectasis and architectural distortion throughout both lungs with a posterior basilar predominance. There is probable early honeycombing in the posterior lower lobes bilaterally. There is a suggestion of mild interval progression of these findings. No significant air trapping on the expiration sequence.  Upper abdomen: Hypodense 0.4 cm right liver dome lesion, too small to characterize, unchanged, probably benign. Simple 2.4 cm upper left renal cyst.  Musculoskeletal: No aggressive appearing focal osseous lesions. Mild thoracic spondylosis.  IMPRESSION: 1. Suggestion of mild interval progression of peripheral basilar predominant fibrotic interstitial lung disease characterized by patchy confluent subpleural reticulation, traction bronchiectasis, architectural distortion and probable early honeycombing, most consistent with usual interstitial pneumonia (UIP). Follow-up high-resolution chest CT can be considered in 12 months to assess temporal pattern stability. 2. Subpleural 5 mm right lower lobe solid pulmonary nodule, for which 4 month stability has been demonstrated, probably benign. Non-contrast chest CT can be considered in 12 months if patient is high-risk. This recommendation follows the consensus statement: Guidelines for Management of Incidental Pulmonary Nodules Detected on CT Images:From the Fleischner Society 2017; published online before print (10.1148/radiol.6256389373). 3. Mild centrilobular and paraseptal emphysema and left lower lobe bullous emphysema. 4. No thoracic adenopathy.   Electronically  Signed   By: Ilona Sorrel M.D.   On: 11/23/2015 16:02  I personally reviewed the CT chest and concur with the findings noted above.  Pulmonary function testing FVC 4.00 (93%) FEV1 2.10 (65%) FEV1 3.00 (93%) post bronchodilator DLCO 17.33 (61%)  Impression: Shaun [name] is a 60 year old man with interstitial lung  disease. Shaun [name] is minimally symptomatic, if at all, but has very impressive changes on CT. Dr. Lamonte [name] feels that a surgical lung biopsy is needed to determine the underlying disease process and guide therapy. I concur with his assessment.  I described the proposed procedure Shaun [name]. Shaun [name] understands that this would be a surgical procedure which would involve a hospital stay and a posthospital recovery phase. I informed him of the general nature of the proposed procedure which is right VATS for lung biopsy. Shaun [name] understands would be done under general anesthesia, the incisions be used, the use of a chest tube postoperatively, the expected hospital stay, and the overall recovery. I reviewed the indications, risks, benefits, and alternatives. Shaun [name] understands the risks include, but are not limited to death (less than 1%), MI, DVT, PE, infection, stroke, bleeding, possible need for transfusion, prolonged air leak, cardiac arrhythmias, as well as the possibility of other unforeseeable complications. Shaun [name] understands the procedure is diagnostic and not therapeutic.  Shaun [name] seemed a little uncertain, but tentatively wished to proceed on Wednesday, November 1. If that date does not work out for him to call the office and we can reschedule at his convenience.  Plan: Right VATS, lung biopsy Wed 02/02/2016  [name], MD [facility] [phone]

## 2016-01-28 NOTE — Pre-Procedure Instructions (Signed)
Shaun [name]  01/28/2016      [id number] - [place], [place] - [place] [place] [address] Phone: [phone] Fax: [phone]    Your procedure is scheduled on 02/02/16.  Report to [facility] Admitting at 630 A.M.  Call this number if you have problems the morning of surgery:  [phone]   Remember:  Do not eat food or drink liquids after midnight.  Take these medicines the morning of surgery with A SIP OF WATER --all inhalers   Do not wear jewelry, make-up or nail polish.  Do not wear lotions, powders, or perfumes, or deoderant.  Do not shave 48 hours prior to surgery.  Men may shave face and neck.  Do not bring valuables to the hospital.  [facility] is not responsible for any belongings or valuables.  Contacts, dentures or bridgework may not be worn into surgery.  Leave your suitcase in the car.  After surgery it may be brought to your room.  For patients admitted to the hospital, discharge time will be determined by your treatment team.  Patients discharged the day of surgery will not be allowed to drive home.   Name and phone number of your driver:    Special instructions: Do not take any aspirin,anti-inflammatories,vitamins,or herbal supplements 5-7 days prior to surgery.  Please read over the following fact sheets that you were given. MRSA Information

## 2016-01-31 ENCOUNTER — Encounter (HOSPITAL_COMMUNITY): Payer: Self-pay

## 2016-01-31 ENCOUNTER — Encounter (HOSPITAL_COMMUNITY)
Admission: RE | Admit: 2016-01-31 | Discharge: 2016-01-31 | Disposition: A | Discharge status: Home / Self Care | Payer: BLUE CROSS/BLUE SHIELD | Source: Ambulatory Visit | Attending: Thoracic Surgery (Cardiothoracic Vascular Surgery) | Admitting: Thoracic Surgery (Cardiothoracic Vascular Surgery)

## 2016-01-31 ENCOUNTER — Ambulatory Visit (HOSPITAL_COMMUNITY)
Admission: RE | Admit: 2016-01-31 | Discharge: 2016-01-31 | Disposition: A | Discharge status: Home / Self Care | Payer: BLUE CROSS/BLUE SHIELD | Source: Ambulatory Visit | Attending: Thoracic Surgery (Cardiothoracic Vascular Surgery) | Admitting: Thoracic Surgery (Cardiothoracic Vascular Surgery)

## 2016-01-31 DIAGNOSIS — J849 Interstitial pulmonary disease, unspecified: Secondary | ICD-10-CM | POA: Insufficient documentation

## 2016-01-31 DIAGNOSIS — Z01818 Encounter for other preprocedural examination: Secondary | ICD-10-CM | POA: Diagnosis not present

## 2016-01-31 DIAGNOSIS — R918 Other nonspecific abnormal finding of lung field: Secondary | ICD-10-CM | POA: Diagnosis not present

## 2016-01-31 HISTORY — DX: Dyspnea, unspecified: R06.00

## 2016-01-31 HISTORY — DX: Pneumonia, unspecified organism: J18.9

## 2016-01-31 LAB — URINALYSIS, ROUTINE W REFLEX MICROSCOPIC
BILIRUBIN URINE: NEGATIVE
Glucose, UA: NEGATIVE mg/dL
HGB URINE DIPSTICK: NEGATIVE
KETONES UR: NEGATIVE mg/dL
Leukocytes, UA: NEGATIVE
NITRITE: NEGATIVE
Protein, ur: NEGATIVE mg/dL
Specific Gravity, Urine: 1.022 (ref 1.005–1.030)
pH: 6 (ref 5.0–8.0)

## 2016-01-31 LAB — COMPREHENSIVE METABOLIC PANEL
ALBUMIN: 4.2 g/dL (ref 3.5–5.0)
ALT: 18 U/L (ref 17–63)
AST: 30 U/L (ref 15–41)
Alkaline Phosphatase: 58 U/L (ref 38–126)
Anion gap: 8 (ref 5–15)
BILIRUBIN TOTAL: 0.8 mg/dL (ref 0.3–1.2)
BUN: 11 mg/dL (ref 6–20)
CHLORIDE: 105 mmol/L (ref 101–111)
CO2: 24 mmol/L (ref 22–32)
Calcium: 9.6 mg/dL (ref 8.9–10.3)
Creatinine, Ser: 0.96 mg/dL (ref 0.61–1.24)
GFR calc Af Amer: >60 mL/min (ref >60)
GFR calc non Af Amer: >60 mL/min (ref >60)
GLUCOSE: 133 mg/dL — AB (ref 65–99)
POTASSIUM: 4.1 mmol/L (ref 3.5–5.1)
SODIUM: 137 mmol/L (ref 135–145)
Total Protein: 6.8 g/dL (ref 6.5–8.1)

## 2016-01-31 LAB — CBC
HEMATOCRIT: 49.1 % (ref 39.0–52.0)
Hemoglobin: 16.7 g/dL (ref 13.0–17.0)
MCH: 32.6 pg (ref 26.0–34.0)
MCHC: 34 g/dL (ref 30.0–36.0)
MCV: 95.7 fL (ref 78.0–100.0)
Platelets: 252 10*3/uL (ref 150–400)
RBC: 5.13 MIL/uL (ref 4.22–5.81)
RDW: 12.5 % (ref 11.5–15.5)
WBC: 8 10*3/uL (ref 4.0–10.5)

## 2016-01-31 LAB — ABO/RH: ABO/RH(D): O NEG

## 2016-01-31 LAB — TYPE AND SCREEN
ABO/RH(D): O NEG
ANTIBODY SCREEN: NEGATIVE

## 2016-01-31 LAB — SURGICAL PCR SCREEN
MRSA, PCR: NEGATIVE
STAPHYLOCOCCUS AUREUS: NEGATIVE

## 2016-01-31 LAB — BLOOD GAS, ARTERIAL
ACID-BASE EXCESS: 1.9 mmol/L (ref 0.0–2.0)
Bicarbonate: 25.8 mmol/L (ref 20.0–28.0)
DRAWN BY: 227661
FIO2: 21
O2 Saturation: 96.9 %
PH ART: 7.428 (ref 7.350–7.450)
Patient temperature: 98.6
pCO2 arterial: 39.9 mmHg (ref 32.0–48.0)
pO2, Arterial: 89.9 mmHg (ref 83.0–108.0)

## 2016-01-31 LAB — PROTIME-INR
INR: 1.02
Prothrombin Time: 13.4 seconds (ref 11.4–15.2)

## 2016-01-31 LAB — APTT: aPTT: 32 seconds (ref 24–36)

## 2016-02-01 MED ORDER — VANCOMYCIN HCL IN DEXTROSE 1-5 GM/200ML-% IV SOLN
1000.0000 mg | INTRAVENOUS | Status: AC
  Administered 2016-02-02: 1000 mg via INTRAVENOUS
  Filled 2016-02-01: qty 200

## 2016-02-02 ENCOUNTER — Encounter (HOSPITAL_COMMUNITY)
Admission: RE | Disposition: A | Discharge status: Home / Self Care | Payer: Self-pay | Source: Ambulatory Visit | Attending: Thoracic Surgery (Cardiothoracic Vascular Surgery)

## 2016-02-02 ENCOUNTER — Inpatient Hospital Stay (HOSPITAL_COMMUNITY): Payer: BLUE CROSS/BLUE SHIELD

## 2016-02-02 ENCOUNTER — Inpatient Hospital Stay (HOSPITAL_COMMUNITY): Payer: BLUE CROSS/BLUE SHIELD | Admitting: Certified Registered Nurse Anesthetist

## 2016-02-02 ENCOUNTER — Encounter (HOSPITAL_COMMUNITY): Payer: Self-pay | Admitting: *Deleted

## 2016-02-02 ENCOUNTER — Inpatient Hospital Stay (HOSPITAL_COMMUNITY)
Admission: RE | Admit: 2016-02-02 | Discharge: 2016-02-05 | DRG: 167 | Disposition: A | Discharge status: Home / Self Care | Payer: BLUE CROSS/BLUE SHIELD | Source: Ambulatory Visit | Attending: Thoracic Surgery (Cardiothoracic Vascular Surgery) | Admitting: Thoracic Surgery (Cardiothoracic Vascular Surgery)

## 2016-02-02 DIAGNOSIS — Z836 Family history of other diseases of the respiratory system: Secondary | ICD-10-CM | POA: Diagnosis not present

## 2016-02-02 DIAGNOSIS — E785 Hyperlipidemia, unspecified: Secondary | ICD-10-CM | POA: Diagnosis present

## 2016-02-02 DIAGNOSIS — Z7982 Long term (current) use of aspirin: Secondary | ICD-10-CM

## 2016-02-02 DIAGNOSIS — Z4682 Encounter for fitting and adjustment of non-vascular catheter: Secondary | ICD-10-CM | POA: Diagnosis not present

## 2016-02-02 DIAGNOSIS — J45909 Unspecified asthma, uncomplicated: Secondary | ICD-10-CM | POA: Diagnosis not present

## 2016-02-02 DIAGNOSIS — J849 Interstitial pulmonary disease, unspecified: Principal | ICD-10-CM

## 2016-02-02 DIAGNOSIS — Z79899 Other long term (current) drug therapy: Secondary | ICD-10-CM | POA: Diagnosis not present

## 2016-02-02 DIAGNOSIS — Z9689 Presence of other specified functional implants: Secondary | ICD-10-CM

## 2016-02-02 DIAGNOSIS — Z09 Encounter for follow-up examination after completed treatment for conditions other than malignant neoplasm: Secondary | ICD-10-CM

## 2016-02-02 DIAGNOSIS — J939 Pneumothorax, unspecified: Secondary | ICD-10-CM | POA: Diagnosis not present

## 2016-02-02 DIAGNOSIS — Z880 Allergy status to penicillin: Secondary | ICD-10-CM | POA: Diagnosis not present

## 2016-02-02 DIAGNOSIS — J9811 Atelectasis: Secondary | ICD-10-CM | POA: Diagnosis not present

## 2016-02-02 DIAGNOSIS — J309 Allergic rhinitis, unspecified: Secondary | ICD-10-CM | POA: Diagnosis not present

## 2016-02-02 HISTORY — PX: LUNG BIOPSY: SHX5088

## 2016-02-02 HISTORY — PX: VIDEO ASSISTED THORACOSCOPY: SHX5073

## 2016-02-02 SURGERY — VIDEO ASSISTED THORACOSCOPY
Anesthesia: General | Site: Chest | Laterality: Right

## 2016-02-02 MED ORDER — FENTANYL 40 MCG/ML IV SOLN
INTRAVENOUS | Status: AC
  Administered 2016-02-02: 40 ug via INTRAVENOUS
  Administered 2016-02-02: 11:00:00 via INTRAVENOUS
  Administered 2016-02-02: 80 ug via INTRAVENOUS
  Administered 2016-02-03: 50 ug via INTRAVENOUS
  Administered 2016-02-03: 10 ug via INTRAVENOUS
  Administered 2016-02-03: 50 ug via INTRAVENOUS
  Administered 2016-02-03: 10 ug via INTRAVENOUS
  Administered 2016-02-03 – 2016-02-04 (×2): 60 ug via INTRAVENOUS
  Administered 2016-02-04: 30 ug via INTRAVENOUS
  Administered 2016-02-04: 20 ug via INTRAVENOUS
  Administered 2016-02-04: 40 ug via INTRAVENOUS

## 2016-02-02 MED ORDER — NALOXONE HCL 0.4 MG/ML IJ SOLN
0.4000 mg | INTRAMUSCULAR | Status: DC | PRN
Start: 2016-02-02 — End: 2016-02-05
  Filled 2016-02-02: qty 1

## 2016-02-02 MED ORDER — ACETAMINOPHEN 160 MG/5ML PO SOLN: 1000.0000 mg | Freq: Four times a day (QID) | ORAL | Status: DC

## 2016-02-02 MED ORDER — BUPIVACAINE 0.5 % ON-Q PUMP SINGLE CATH 400 ML
400.0000 mL | INJECTION | Status: AC
  Administered 2016-02-02: 400 mL
  Filled 2016-02-02: qty 400

## 2016-02-02 MED ORDER — DEXAMETHASONE SODIUM PHOSPHATE 10 MG/ML IJ SOLN
INTRAMUSCULAR | Status: DC | PRN
  Administered 2016-02-02: 10 mg via INTRAVENOUS

## 2016-02-02 MED ORDER — BUPIVACAINE HCL (PF) 0.5 % IJ SOLN
INTRAMUSCULAR | Status: DC | PRN
  Administered 2016-02-02: 10 mL

## 2016-02-02 MED ORDER — LACTATED RINGERS IV SOLN
INTRAVENOUS | Status: DC | PRN
  Administered 2016-02-02: 08:00:00 via INTRAVENOUS

## 2016-02-02 MED ORDER — EPHEDRINE 5 MG/ML INJ
INTRAVENOUS | Status: AC
  Filled 2016-02-02: qty 10

## 2016-02-02 MED ORDER — OXYCODONE HCL 5 MG/5ML PO SOLN: 5.0000 mg | Freq: Once | ORAL | Status: DC | PRN

## 2016-02-02 MED ORDER — GLYCOPYRROLATE 0.2 MG/ML IV SOSY
PREFILLED_SYRINGE | INTRAVENOUS | Status: AC
  Filled 2016-02-02: qty 3

## 2016-02-02 MED ORDER — DEXAMETHASONE SODIUM PHOSPHATE 10 MG/ML IJ SOLN
INTRAMUSCULAR | Status: AC
  Filled 2016-02-02: qty 1

## 2016-02-02 MED ORDER — FENTANYL CITRATE (PF) 250 MCG/5ML IJ SOLN
INTRAMUSCULAR | Status: AC
  Filled 2016-02-02: qty 5

## 2016-02-02 MED ORDER — BISACODYL 5 MG PO TBEC
10.0000 mg | DELAYED_RELEASE_TABLET | Freq: Every day | ORAL | Status: DC
  Administered 2016-02-03: 10 mg via ORAL
  Filled 2016-02-02: qty 2

## 2016-02-02 MED ORDER — SODIUM CHLORIDE 0.9% FLUSH: 9.0000 mL | INTRAVENOUS | Status: DC | PRN

## 2016-02-02 MED ORDER — SUGAMMADEX SODIUM 200 MG/2ML IV SOLN
INTRAVENOUS | Status: AC
  Filled 2016-02-02: qty 2

## 2016-02-02 MED ORDER — PANTOPRAZOLE SODIUM 40 MG PO TBEC
40.0000 mg | DELAYED_RELEASE_TABLET | Freq: Every day | ORAL | Status: DC
  Administered 2016-02-04 – 2016-02-05 (×2): 40 mg via ORAL
  Filled 2016-02-02 (×3): qty 1

## 2016-02-02 MED ORDER — ONDANSETRON HCL 4 MG/2ML IJ SOLN: 4.0000 mg | Freq: Four times a day (QID) | INTRAMUSCULAR | Status: DC | PRN

## 2016-02-02 MED ORDER — ALBUTEROL SULFATE (2.5 MG/3ML) 0.083% IN NEBU
2.5000 mg | INHALATION_SOLUTION | RESPIRATORY_TRACT | Status: DC
  Administered 2016-02-02: 2.5 mg via RESPIRATORY_TRACT
  Filled 2016-02-02: qty 3

## 2016-02-02 MED ORDER — VANCOMYCIN HCL IN DEXTROSE 1-5 GM/200ML-% IV SOLN
1000.0000 mg | Freq: Two times a day (BID) | INTRAVENOUS | Status: AC
  Administered 2016-02-02: 1000 mg via INTRAVENOUS
  Filled 2016-02-02: qty 200

## 2016-02-02 MED ORDER — PROPOFOL 10 MG/ML IV BOLUS
INTRAVENOUS | Status: AC
  Filled 2016-02-02: qty 40

## 2016-02-02 MED ORDER — LIDOCAINE 2% (20 MG/ML) 5 ML SYRINGE
INTRAMUSCULAR | Status: AC
  Filled 2016-02-02: qty 5

## 2016-02-02 MED ORDER — MIDAZOLAM HCL 5 MG/5ML IJ SOLN
INTRAMUSCULAR | Status: DC | PRN
  Administered 2016-02-02: 2 mg via INTRAVENOUS

## 2016-02-02 MED ORDER — DIPHENHYDRAMINE HCL 50 MG/ML IJ SOLN
12.5000 mg | Freq: Four times a day (QID) | INTRAMUSCULAR | Status: DC | PRN
  Filled 2016-02-02: qty 0.25

## 2016-02-02 MED ORDER — PHENYLEPHRINE HCL 10 MG/ML IJ SOLN
INTRAVENOUS | Status: DC | PRN
  Administered 2016-02-02: 25 ug/min via INTRAVENOUS

## 2016-02-02 MED ORDER — ROCURONIUM BROMIDE 10 MG/ML (PF) SYRINGE
PREFILLED_SYRINGE | INTRAVENOUS | Status: AC
  Filled 2016-02-02: qty 10

## 2016-02-02 MED ORDER — ONDANSETRON HCL 4 MG/2ML IJ SOLN
4.0000 mg | Freq: Four times a day (QID) | INTRAMUSCULAR | Status: DC | PRN
  Filled 2016-02-02: qty 2

## 2016-02-02 MED ORDER — ONDANSETRON HCL 4 MG/2ML IJ SOLN
INTRAMUSCULAR | Status: AC
  Filled 2016-02-02: qty 2

## 2016-02-02 MED ORDER — DIPHENHYDRAMINE HCL 12.5 MG/5ML PO ELIX
12.5000 mg | ORAL_SOLUTION | Freq: Four times a day (QID) | ORAL | Status: DC | PRN
  Filled 2016-02-02: qty 5

## 2016-02-02 MED ORDER — FENTANYL CITRATE (PF) 100 MCG/2ML IJ SOLN
INTRAMUSCULAR | Status: DC | PRN
  Administered 2016-02-02 (×2): 50 ug via INTRAVENOUS
  Administered 2016-02-02: 25 ug via INTRAVENOUS
  Administered 2016-02-02: 50 ug via INTRAVENOUS
  Administered 2016-02-02: 25 ug via INTRAVENOUS
  Administered 2016-02-02: 50 ug via INTRAVENOUS
  Administered 2016-02-02: 100 ug via INTRAVENOUS

## 2016-02-02 MED ORDER — POTASSIUM CHLORIDE 10 MEQ/50ML IV SOLN: 10.0000 meq | Freq: Every day | INTRAVENOUS | Status: DC | PRN

## 2016-02-02 MED ORDER — OXYCODONE HCL 5 MG PO TABS: 5.0000 mg | ORAL_TABLET | ORAL | Status: DC | PRN

## 2016-02-02 MED ORDER — TRAMADOL HCL 50 MG PO TABS: 50.0000 mg | ORAL_TABLET | Freq: Four times a day (QID) | ORAL | Status: DC | PRN

## 2016-02-02 MED ORDER — ASPIRIN EC 81 MG PO TBEC
81.0000 mg | DELAYED_RELEASE_TABLET | Freq: Every day | ORAL | Status: DC
  Administered 2016-02-03 – 2016-02-05 (×3): 81 mg via ORAL
  Filled 2016-02-02 (×3): qty 1

## 2016-02-02 MED ORDER — OXYCODONE HCL 5 MG PO TABS: 5.0000 mg | ORAL_TABLET | Freq: Once | ORAL | Status: DC | PRN

## 2016-02-02 MED ORDER — ROCURONIUM BROMIDE 100 MG/10ML IV SOLN
INTRAVENOUS | Status: DC | PRN
  Administered 2016-02-02: 10 mg via INTRAVENOUS
  Administered 2016-02-02: 50 mg via INTRAVENOUS

## 2016-02-02 MED ORDER — SIMVASTATIN 20 MG PO TABS
10.0000 mg | ORAL_TABLET | Freq: Every day | ORAL | Status: DC
  Administered 2016-02-02 – 2016-02-04 (×3): 10 mg via ORAL
  Filled 2016-02-02 (×3): qty 1

## 2016-02-02 MED ORDER — SENNOSIDES-DOCUSATE SODIUM 8.6-50 MG PO TABS
1.0000 | ORAL_TABLET | Freq: Every day | ORAL | Status: DC
  Administered 2016-02-02: 1 via ORAL
  Filled 2016-02-02: qty 1

## 2016-02-02 MED ORDER — EPHEDRINE SULFATE-NACL 50-0.9 MG/10ML-% IV SOSY
PREFILLED_SYRINGE | INTRAVENOUS | Status: DC | PRN
  Administered 2016-02-02 (×2): 5 mg via INTRAVENOUS

## 2016-02-02 MED ORDER — PROPOFOL 10 MG/ML IV BOLUS
INTRAVENOUS | Status: DC | PRN
  Administered 2016-02-02: 150 mg via INTRAVENOUS
  Administered 2016-02-02: 20 mg via INTRAVENOUS

## 2016-02-02 MED ORDER — 0.9 % SODIUM CHLORIDE (POUR BTL) OPTIME
TOPICAL | Status: DC | PRN
  Administered 2016-02-02: 2000 mL

## 2016-02-02 MED ORDER — ATROPINE SULFATE 1 MG/ML IJ SOLN
INTRAMUSCULAR | Status: AC
  Filled 2016-02-02: qty 1

## 2016-02-02 MED ORDER — LIDOCAINE HCL (CARDIAC) 20 MG/ML IV SOLN
INTRAVENOUS | Status: DC | PRN
  Administered 2016-02-02: 40 mg via INTRAVENOUS

## 2016-02-02 MED ORDER — PHENYLEPHRINE HCL 10 MG/ML IJ SOLN
INTRAMUSCULAR | Status: DC | PRN
  Administered 2016-02-02 (×2): 40 ug via INTRAVENOUS

## 2016-02-02 MED ORDER — HYDROMORPHONE HCL 1 MG/ML IJ SOLN: 0.2500 mg | INTRAMUSCULAR | Status: DC | PRN

## 2016-02-02 MED ORDER — PHENYLEPHRINE 40 MCG/ML (10ML) SYRINGE FOR IV PUSH (FOR BLOOD PRESSURE SUPPORT)
PREFILLED_SYRINGE | INTRAVENOUS | Status: AC
  Filled 2016-02-02: qty 10

## 2016-02-02 MED ORDER — ENOXAPARIN SODIUM 40 MG/0.4ML ~~LOC~~ SOLN
40.0000 mg | SUBCUTANEOUS | Status: DC
  Administered 2016-02-03 – 2016-02-04 (×2): 40 mg via SUBCUTANEOUS
  Filled 2016-02-02 (×2): qty 0.4

## 2016-02-02 MED ORDER — BUPIVACAINE HCL (PF) 0.5 % IJ SOLN
INTRAMUSCULAR | Status: AC
  Filled 2016-02-02: qty 10

## 2016-02-02 MED ORDER — ONDANSETRON HCL 4 MG/2ML IJ SOLN
INTRAMUSCULAR | Status: DC | PRN
  Administered 2016-02-02: 4 mg via INTRAVENOUS

## 2016-02-02 MED ORDER — MIDAZOLAM HCL 2 MG/2ML IJ SOLN
INTRAMUSCULAR | Status: AC
  Filled 2016-02-02: qty 2

## 2016-02-02 MED ORDER — ACETAMINOPHEN 500 MG PO TABS
1000.0000 mg | ORAL_TABLET | Freq: Four times a day (QID) | ORAL | Status: DC
  Administered 2016-02-02 – 2016-02-05 (×10): 1000 mg via ORAL
  Filled 2016-02-02 (×11): qty 2

## 2016-02-02 MED ORDER — FENTANYL 40 MCG/ML IV SOLN
INTRAVENOUS | Status: AC
  Filled 2016-02-02: qty 25

## 2016-02-02 MED ORDER — ONDANSETRON HCL 4 MG/2ML IJ SOLN: 4.0000 mg | Freq: Once | INTRAMUSCULAR | Status: DC | PRN

## 2016-02-02 MED ORDER — ALBUTEROL SULFATE (2.5 MG/3ML) 0.083% IN NEBU: 2.5000 mg | INHALATION_SOLUTION | Freq: Four times a day (QID) | RESPIRATORY_TRACT | Status: DC

## 2016-02-02 MED ORDER — SUGAMMADEX SODIUM 200 MG/2ML IV SOLN
INTRAVENOUS | Status: DC | PRN
  Administered 2016-02-02: 200 mg via INTRAVENOUS

## 2016-02-02 SURGICAL SUPPLY — 82 items
ADH SKN CLS APL DERMABOND .7 (GAUZE/BANDAGES/DRESSINGS) ×2
APPLIER CLIP ROT 10 11.4 M/L (STAPLE)
APR CLP MED LRG 11.4X10 (STAPLE)
BAG SPEC RTRVL LRG 6X4 10 (ENDOMECHANICALS)
CANISTER SUCTION 2500CC (MISCELLANEOUS) ×3 IMPLANT
CATH KIT ON Q 5IN SLV (PAIN MANAGEMENT) IMPLANT
CATH KIT ON-Q SILVERSOAK 5 (CATHETERS) IMPLANT
CATH KIT ON-Q SILVERSOAK 5IN (CATHETERS) ×3 IMPLANT
CATH THORACIC 28FR (CATHETERS) ×1 IMPLANT
CATH THORACIC 28FR RT ANG (CATHETERS) IMPLANT
CATH THORACIC 36FR (CATHETERS) IMPLANT
CATH THORACIC 36FR RT ANG (CATHETERS) IMPLANT
CLIP APPLIE ROT 10 11.4 M/L (STAPLE) IMPLANT
CLIP TI MEDIUM 6 (CLIP) ×1 IMPLANT
CONN Y 3/8X3/8X3/8  BEN (MISCELLANEOUS) ×1
CONN Y 3/8X3/8X3/8 BEN (MISCELLANEOUS) ×2 IMPLANT
CONT SPEC 4OZ CLIKSEAL STRL BL (MISCELLANEOUS) ×6 IMPLANT
COVER MAYO STAND STRL (DRAPES) ×1 IMPLANT
COVER SURGICAL LIGHT HANDLE (MISCELLANEOUS) ×3 IMPLANT
DERMABOND ADVANCED (GAUZE/BANDAGES/DRESSINGS) ×1
DERMABOND ADVANCED .7 DNX12 (GAUZE/BANDAGES/DRESSINGS) IMPLANT
DRAIN CHANNEL 28F RND 3/8 FF (WOUND CARE) IMPLANT
DRAIN CHANNEL 32F RND 10.7 FF (WOUND CARE) IMPLANT
DRAPE LAPAROSCOPIC ABDOMINAL (DRAPES) ×3 IMPLANT
DRAPE WARM FLUID 44X44 (DRAPE) ×3 IMPLANT
ELECT BLADE 6.5 EXT (BLADE) ×1 IMPLANT
ELECT REM PT RETURN 9FT ADLT (ELECTROSURGICAL) ×3
ELECTRODE REM PT RTRN 9FT ADLT (ELECTROSURGICAL) ×2 IMPLANT
GAUZE SPONGE 4X4 12PLY STRL (GAUZE/BANDAGES/DRESSINGS) ×3 IMPLANT
GLOVE SURG SIGNA 7.5 PF LTX (GLOVE) ×6 IMPLANT
GOWN STRL REUS W/ TWL LRG LVL3 (GOWN DISPOSABLE) ×4 IMPLANT
GOWN STRL REUS W/ TWL XL LVL3 (GOWN DISPOSABLE) ×2 IMPLANT
GOWN STRL REUS W/TWL LRG LVL3 (GOWN DISPOSABLE) ×6
GOWN STRL REUS W/TWL XL LVL3 (GOWN DISPOSABLE) ×3
HEMOSTAT SURGICEL 2X14 (HEMOSTASIS) IMPLANT
KIT BASIN OR (CUSTOM PROCEDURE TRAY) ×3 IMPLANT
KIT ROOM TURNOVER OR (KITS) ×3 IMPLANT
KIT SUCTION CATH 14FR (SUCTIONS) ×3 IMPLANT
NS IRRIG 1000ML POUR BTL (IV SOLUTION) ×6 IMPLANT
PACK CHEST (CUSTOM PROCEDURE TRAY) ×3 IMPLANT
PAD ARMBOARD 7.5X6 YLW CONV (MISCELLANEOUS) ×6 IMPLANT
POUCH ENDO CATCH II 15MM (MISCELLANEOUS) IMPLANT
POUCH SPECIMEN RETRIEVAL 10MM (ENDOMECHANICALS) IMPLANT
RELOAD STAPLE 60 3.8 GOLD REG (STAPLE) IMPLANT
RELOAD STAPLER GOLD 60MM (STAPLE) ×12 IMPLANT
SEALANT PROGEL (MISCELLANEOUS) IMPLANT
SEALANT SURG COSEAL 4ML (VASCULAR PRODUCTS) IMPLANT
SEALANT SURG COSEAL 8ML (VASCULAR PRODUCTS) IMPLANT
SOLUTION ANTI FOG 6CC (MISCELLANEOUS) ×3 IMPLANT
SPECIMEN JAR MEDIUM (MISCELLANEOUS) ×6 IMPLANT
SPONGE INTESTINAL PEANUT (DISPOSABLE) IMPLANT
SPONGE TONSIL 1 RF SGL (DISPOSABLE) ×3 IMPLANT
STAPLE ECHEON FLEX 60 POW ENDO (STAPLE) ×1 IMPLANT
STAPLER RELOAD GOLD 60MM (STAPLE) ×18
SUT PROLENE 4 0 RB 1 (SUTURE)
SUT PROLENE 4-0 RB1 .5 CRCL 36 (SUTURE) IMPLANT
SUT SILK  1 MH (SUTURE) ×1
SUT SILK 1 MH (SUTURE) ×4 IMPLANT
SUT SILK 2 0SH CR/8 30 (SUTURE) IMPLANT
SUT SILK 3 0SH CR/8 30 (SUTURE) ×1 IMPLANT
SUT VIC AB 1 CTX 36 (SUTURE) ×3
SUT VIC AB 1 CTX36XBRD ANBCTR (SUTURE) IMPLANT
SUT VIC AB 2-0 CTX 36 (SUTURE) ×1 IMPLANT
SUT VIC AB 2-0 UR6 27 (SUTURE) IMPLANT
SUT VIC AB 3-0 MH 27 (SUTURE) IMPLANT
SUT VIC AB 3-0 X1 27 (SUTURE) ×3 IMPLANT
SUT VICRYL 2 TP 1 (SUTURE) IMPLANT
SWAB COLLECTION DEVICE MRSA (MISCELLANEOUS) IMPLANT
SYR 5ML LL (SYRINGE) ×1 IMPLANT
SYSTEM SAHARA CHEST DRAIN ATS (WOUND CARE) ×3 IMPLANT
TAPE CLOTH 4X10 WHT NS (GAUZE/BANDAGES/DRESSINGS) ×3 IMPLANT
TAPE CLOTH SURG 4X10 WHT LF (GAUZE/BANDAGES/DRESSINGS) ×1 IMPLANT
TIP APPLICATOR SPRAY EXTEND 16 (VASCULAR PRODUCTS) IMPLANT
TOWEL OR 17X24 6PK STRL BLUE (TOWEL DISPOSABLE) ×3 IMPLANT
TOWEL OR 17X26 10 PK STRL BLUE (TOWEL DISPOSABLE) ×6 IMPLANT
TRAP SPECIMEN MUCOUS 40CC (MISCELLANEOUS) IMPLANT
TRAY FOLEY CATH 16FRSI W/METER (SET/KITS/TRAYS/PACK) ×3 IMPLANT
TROCAR XCEL BLADELESS 5X75MML (TROCAR) ×3 IMPLANT
TROCAR XCEL NON-BLD 5MMX100MML (ENDOMECHANICALS) IMPLANT
TUBE ANAEROBIC SPECIMEN COL (MISCELLANEOUS) IMPLANT
TUNNELER SHEATH ON-Q 11GX8 DSP (PAIN MANAGEMENT) ×1 IMPLANT
WATER STERILE IRR 1000ML POUR (IV SOLUTION) ×6 IMPLANT

## 2016-02-02 NOTE — H&P (View-Only) (Signed)
PCP is Mayra Neer, MD Referring Provider is Collene Gobble, MD  Chief Complaint  Patient presents with  . Interstitial Lung Disease    eval for lung BX.Marland KitchenMarland KitchenCT CHEST 11/23/15    HPI: 60 year old man sent for consultation regarding a lung biopsy.  Shaun [name] is a 60 year old man with a past medical history significant for hyperlipidemia. He saw Shaun [name] in February for an annual exam. She noted abnormal of breath sounds in order to chest x-ray. Chest x-ray was abnormal as well as a follow-up. He was referred to Shaun [name] who did a CT of the chest. It showed changes consistent with interstitial lung disease.  He recently had a follow-up CT which suggested mild interval progression. Pulmonary function testing showed some obstruction but an FEV1 of 2.100 (65%) which increased to 3.00 (93%).  Overall Shaun [name] feels fairly well. His appetite is good. He has not lost any weight. He has an occasional cough, but nothing out of the ordinary. He denies any wheezing and has not been using his rescue inhaler. He denies any chest pain, pressure, or tightness with exertion. He walked up to 4 flights of stairs to our office and did say he was short of breath when he got to the top.  Past Medical History:  Diagnosis Date  . Hyperlipemia     Past Surgical History:  Procedure Laterality Date  . COLONOSCOPY WITH PROPOFOL N/A 09/21/2014   Procedure: COLONOSCOPY WITH PROPOFOL;  Surgeon: Garlan Fair, MD;  Location: [facility];  Service: Endoscopy;  Laterality: N/A;  . FOREARM SURGERY     excision of birth mark"child"  . HERNIA REPAIR     RIH-open  . KNEE ARTHROTOMY Left    bursa excision  . NASAL SEPTUM SURGERY    . WRIST GANGLION EXCISION Left     Family history Father died of pulmonary fibrosis in his 70s Grandfather had emphysema Mother died of lung cancer  Social History Social History  Substance Use Topics  . Smoking status: Never Smoker  . Smokeless tobacco: Not on file  .  Alcohol use No    Current Outpatient Prescriptions  Medication Sig Dispense Refill  . albuterol (PROVENTIL HFA;VENTOLIN HFA) 108 (90 Base) MCG/ACT inhaler Inhale 2 puffs into the lungs every 6 (six) hours as needed for wheezing or shortness of breath. 1 Inhaler 5  . aspirin 81 MG tablet Take 81 mg by mouth daily.    . cetirizine (ZYRTEC ALLERGY) 10 MG tablet Take 10 mg by mouth daily.    . chlorpheniramine (CHLOR-TRIMETON) 2 MG/5ML syrup Take 2 mg by mouth every 4 (four) hours as needed for allergies.    . Multiple Vitamin (MULTIVITAMIN WITH MINERALS) TABS tablet Take 1 tablet by mouth daily.    . pseudoephedrine (SUDAFED) 60 MG tablet Take 60 mg by mouth every 4 (four) hours as needed for congestion.    . simvastatin (ZOCOR) 10 MG tablet Take 10 mg by mouth daily.   0   No current facility-administered medications for this visit.     Allergies  Allergen Reactions  . Penicillins     Childhood allergy    Review of Systems  Constitutional: Negative for activity change, appetite change, chills, fever and unexpected weight change.  HENT: Negative for voice change.   Eyes: Negative for visual disturbance.  Respiratory: Negative for cough, shortness of breath and wheezing.   Cardiovascular: Negative for chest pain and leg swelling.  Gastrointestinal: Negative for abdominal pain and blood in stool.  Genitourinary:  Negative for difficulty urinating and dysuria.  Musculoskeletal: Negative for arthralgias and joint swelling.  Neurological: Negative for seizures, syncope and weakness.  Hematological: Negative for adenopathy. Does not bruise/bleed easily.  All other systems reviewed and are negative.   BP (!) 134/92   [name] 98   Resp 16   Ht _0  (1.702 m)   Wt 150 lb (68 kg)   SpO2 97% Comment: ON RA  BMI 23.49 kg/m  Physical Exam  Constitutional: He is oriented to person, place, and time. He appears well-developed and well-nourished. No distress.  HENT:  Head: Normocephalic and  atraumatic.  Mouth/Throat: No oropharyngeal exudate.  Eyes: Conjunctivae and EOM are normal. No scleral icterus.  Neck: Neck supple. No thyromegaly present.  Cardiovascular: Normal rate, regular rhythm, normal heart sounds and intact distal pulses.  Exam reveals no gallop and no friction rub.   No murmur heard. Pulmonary/Chest: Effort normal. No respiratory distress. He has rales (bibasilar, right greater than left).  Abdominal: Soft. He exhibits no distension. There is no tenderness.  Musculoskeletal: He exhibits no edema.  Lymphadenopathy:    He has no cervical adenopathy.  Neurological: He is alert and oriented to person, place, and time. No cranial nerve deficit.  No focal motor deficit  Skin: Skin is warm and dry.  Vitals reviewed.    Diagnostic Tests: CT CHEST WITHOUT CONTRAST  TECHNIQUE: Multidetector CT imaging of the chest was performed following the standard protocol without intravenous contrast. High resolution imaging of the lungs, as well as inspiratory and expiratory imaging, was performed.  COMPARISON:  07/08/2015 chest CT.  FINDINGS: Motion degraded scan.  Mediastinum/Nodes: Normal heart size. No significant pericardial fluid/thickening. Great vessels are normal in course and caliber. No discrete thyroid nodules. Unremarkable esophagus. No pathologically enlarged axillary, mediastinal or gross hilar lymph nodes, noting limited sensitivity for the detection of hilar adenopathy on this noncontrast study. Top-normal caliber right hilar, AP window and subcarinal nodes are stable.  Lungs/Pleura: No pneumothorax. No pleural effusion. Mild centrilobular and paraseptal emphysema. Posterior left lower lobe 5.9 cm bulla, not appreciably changed. No acute consolidative airspace disease. Subpleural 5 mm anterior right lower lobe solid pulmonary nodule (series 4/ image 67), stable. No lung masses or new significant pulmonary nodules. There is patchy confluent  subpleural reticulation, traction bronchiectasis and architectural distortion throughout both lungs with a posterior basilar predominance. There is probable early honeycombing in the posterior lower lobes bilaterally. There is a suggestion of mild interval progression of these findings. No significant air trapping on the expiration sequence.  Upper abdomen: Hypodense 0.4 cm right liver dome lesion, too small to characterize, unchanged, probably benign. Simple 2.4 cm upper left renal cyst.  Musculoskeletal: No aggressive appearing focal osseous lesions. Mild thoracic spondylosis.  IMPRESSION: 1. Suggestion of mild interval progression of peripheral basilar predominant fibrotic interstitial lung disease characterized by patchy confluent subpleural reticulation, traction bronchiectasis, architectural distortion and probable early honeycombing, most consistent with usual interstitial pneumonia (UIP). Follow-up high-resolution chest CT can be considered in 12 months to assess temporal pattern stability. 2. Subpleural 5 mm right lower lobe solid pulmonary nodule, for which 4 month stability has been demonstrated, probably benign. Non-contrast chest CT can be considered in 12 months if patient is high-risk. This recommendation follows the consensus statement: Guidelines for Management of Incidental Pulmonary Nodules Detected on CT Images:From the Fleischner Society 2017; published online before print (10.1148/radiol.6270350093). 3. Mild centrilobular and paraseptal emphysema and left lower lobe bullous emphysema. 4. No thoracic adenopathy.   Electronically  Signed   By: Ilona Sorrel M.D.   On: 11/23/2015 16:02  I personally reviewed the CT chest and concur with the findings noted above.  Pulmonary function testing FVC 4.00 (93%) FEV1 2.10 (65%) FEV1 3.00 (93%) post bronchodilator DLCO 17.33 (61%)  Impression: Shaun [name] is a 60 year old man with interstitial lung  disease. He is minimally symptomatic, if at all, but has very impressive changes on CT. Shaun [name] feels that a surgical lung biopsy is needed to determine the underlying disease process and guide therapy. I concur with his assessment.  I described the proposed procedure Shaun [name]. He understands that this would be a surgical procedure which would involve a hospital stay and a posthospital recovery phase. I informed him of the general nature of the proposed procedure which is right VATS for lung biopsy. He understands would be done under general anesthesia, the incisions be used, the use of a chest tube postoperatively, the expected hospital stay, and the overall recovery. I reviewed the indications, risks, benefits, and alternatives. He understands the risks include, but are not limited to death (less than 1%), MI, DVT, PE, infection, stroke, bleeding, possible need for transfusion, prolonged air leak, cardiac arrhythmias, as well as the possibility of other unforeseeable complications. He understands the procedure is diagnostic and not therapeutic.  He seemed a little uncertain, but tentatively wished to proceed on Wednesday, November 1. If that date does not work out for him to call the office and we can reschedule at his convenience.  Plan: Right VATS, lung biopsy Wed 02/02/2016  [name], MD [facility] [phone]

## 2016-02-02 NOTE — Interval H&P Note (Signed)
History and Physical Interval Note:  02/02/2016 8:10 AM  Shaun [name]  has presented today for surgery, with the diagnosis of ILD  The various methods of treatment have been discussed with the patient and family. After consideration of risks, benefits and other options for treatment, the patient has consented to  Procedure(s): VIDEO ASSISTED THORACOSCOPY (Right) LUNG BIOPSY (Right) as a surgical intervention .  The patient's history has been reviewed, patient examined, no change in status, stable for surgery.  I have reviewed the patient's chart and labs.  Questions were answered to the patient's satisfaction.     [name]

## 2016-02-02 NOTE — Brief Op Note (Addendum)
02/02/2016  10:10 AM  PATIENT:  Shaun [name]  60 y.o. male  PRE-OPERATIVE DIAGNOSIS:  ILD  POST-OPERATIVE DIAGNOSIS: ILD  PROCEDURE:  Procedure(s): VIDEO ASSISTED THORACOSCOPY (Right) LUNG BIOPSY (Right) WEDGE RESECTION RUL;RML;RLL PLACEMENT of ON_Q LOCAL ANESTHETIC CATHETER  SURGEON:  Surgeon(s) and Role:    * [name], MD - Primary  PHYSICIAN ASSISTANT: WAYNE GOLD PA-C  ANESTHESIA:   general  EBL:  Total I/O In: 1000 [I.V.:1000] Out: 105 [Urine:90; Blood:15]  BLOOD ADMINISTERED:none  DRAINS: 1 Chest Tube(s) in the RIGHT HEMITHORAX   LOCAL MEDICATIONS USED:  MARCAINE     SPECIMEN:  Source of Specimen:  AS ABOVE  DISPOSITION OF SPECIMEN:  PATHOLOGY AND MICRO  COUNTS:  YES  PLAN OF CARE: Admit to inpatient   PATIENT DISPOSITION:  PACU - hemodynamically stable.   Delay start of Pharmacological VTE agent (>24hrs) due to surgical blood loss or risk of bleeding: no

## 2016-02-02 NOTE — Anesthesia Procedure Notes (Signed)
Procedure Name: Intubation Date/Time: 02/02/2016 8:48 AM Performed by: Everlean Cherry A Pre-anesthesia Checklist: Patient identified, Emergency Drugs available, Suction available and Patient being monitored Patient Re-evaluated:Patient Re-evaluated prior to inductionOxygen Delivery Method: Circle system utilized Preoxygenation: Pre-oxygenation with 100% oxygen Intubation Type: IV induction Ventilation: Mask ventilation without difficulty Laryngoscope Size: Glidescope and 4 Grade View: Grade I Endobronchial tube: Left, Double lumen EBT, EBT position confirmed by auscultation and EBT position confirmed by fiberoptic bronchoscope and 37 Fr Number of attempts: 4 Airway Equipment and Method: Fiberoptic brochoscope,  Stylet and Video-laryngoscopy Placement Confirmation: ETT inserted through vocal cords under direct vision,  positive ETCO2 and breath sounds checked- equal and bilateral Tube secured with: Tape Dental Injury: Teeth and Oropharynx as per pre-operative assessment  Difficulty Due To: Difficulty was unanticipated and Difficult Airway- due to limited oral opening Comments: DL x1 with MAC 3. Grade 3 view and unable to pass DLT through mouth opening due to small oral opening.  DL x2 with Mil 3.  Unable to pass DLT through mouth opening.  DL x3 by Dr. Linna Caprice and difficulty passing DLT without possibly rupturing cuff.  DL x4 with glidescope 4.  Grade 1 view.  Single lumen ETT passed without difficulty.  Exchange catheter passed through single lumen ETT and tube exchanged for 63F L DLT.  Position verified by ausculation and fiberoptic bronchoscopy.  Mask ventilation positive throughout.  VSS.  EBBS.

## 2016-02-02 NOTE — Anesthesia Postprocedure Evaluation (Signed)
Anesthesia Post Note  Patient: Shaun [name]  Procedure(s) Performed: Procedure(s) (LRB): VIDEO ASSISTED THORACOSCOPY (Right) LUNG BIOPSY (Right)  Anesthesia Type: General Level of consciousness: awake, awake and alert and oriented Pain management: pain level controlled Vital Signs Assessment: post-procedure vital signs reviewed and stable Respiratory status: spontaneous breathing, nonlabored ventilation and respiratory function stable Cardiovascular status: blood pressure returned to baseline Anesthetic complications: no    Last Vitals:  Vitals:   02/02/16 1100 02/02/16 1115  BP:    Pulse: 88 79  Resp: 17 10  Temp:      Last Pain:  Vitals:   02/02/16 1107  TempSrc:   PainSc: 2                  Tressa Maldonado COKER

## 2016-02-02 NOTE — Transfer of Care (Signed)
Immediate Anesthesia Transfer of Care Note  Patient: Shaun [name]  Procedure(s) Performed: Procedure(s): VIDEO ASSISTED THORACOSCOPY (Right) LUNG BIOPSY (Right)  Patient Location: PACU  Anesthesia Type:General  Level of Consciousness: awake, alert , oriented and patient cooperative  Airway & Oxygen Therapy: Patient Spontanous Breathing and Patient connected to face mask oxygen  Post-op Assessment: Report given to RN, Post -op Vital signs reviewed and stable and Patient moving all extremities X 4  Post vital signs: Reviewed and stable  Last Vitals:  Vitals:   02/02/16 0644 02/02/16 1030  BP: 129/69   Pulse: 80 95  Resp: 20   Temp: 36.9 C 36.3 C    Last Pain:  Vitals:   02/02/16 0644  TempSrc: Oral      Patients Stated Pain Goal: 3 (76/16/07 3710)  Complications: No apparent anesthesia complications

## 2016-02-02 NOTE — Anesthesia Preprocedure Evaluation (Signed)
Anesthesia Evaluation  Patient identified by MRN, date of birth, ID band Patient awake    Reviewed: Allergy & Precautions, NPO status , Patient's Chart, lab work & pertinent test results  Airway Mallampati: II  TM Distance: >3 FB Neck ROM: Full    Dental  (+) Teeth Intact, Dental Advisory Given   Pulmonary    breath sounds clear to auscultation       Cardiovascular  Rhythm:Regular Rate:Normal     Neuro/Psych    GI/Hepatic   Endo/Other    Renal/GU      Musculoskeletal   Abdominal   Peds  Hematology   Anesthesia Other Findings   Reproductive/Obstetrics                             Anesthesia Physical Anesthesia Plan  ASA: III  Anesthesia Plan: General   Post-op Pain Management:    Induction: Intravenous  Airway Management Planned: Double Lumen EBT  Additional Equipment: Arterial line and CVP  Intra-op Plan:   Post-operative Plan: Extubation in OR  Informed Consent: I have reviewed the patients History and Physical, chart, labs and discussed the procedure including the risks, benefits and alternatives for the proposed anesthesia with the patient or authorized representative who has indicated his/her understanding and acceptance.   Dental advisory given  Plan Discussed with: CRNA and Anesthesiologist  Anesthesia Plan Comments:         Anesthesia Quick Evaluation

## 2016-02-03 ENCOUNTER — Encounter (HOSPITAL_COMMUNITY): Payer: Self-pay | Admitting: Thoracic Surgery (Cardiothoracic Vascular Surgery)

## 2016-02-03 ENCOUNTER — Inpatient Hospital Stay (HOSPITAL_COMMUNITY): Payer: BLUE CROSS/BLUE SHIELD

## 2016-02-03 LAB — BLOOD GAS, ARTERIAL
Acid-Base Excess: 1.2 mmol/L (ref 0.0–2.0)
BICARBONATE: 25.1 mmol/L (ref 20.0–28.0)
DRAWN BY: 418751
O2 Content: 2 L/min
O2 SAT: 95.6 %
PATIENT TEMPERATURE: 98.6
pCO2 arterial: 38.6 mmHg (ref 32.0–48.0)
pH, Arterial: 7.429 (ref 7.350–7.450)
pO2, Arterial: 77.3 mmHg — ABNORMAL LOW (ref 83.0–108.0)

## 2016-02-03 LAB — BASIC METABOLIC PANEL
ANION GAP: 5 (ref 5–15)
BUN: 11 mg/dL (ref 6–20)
CALCIUM: 8.9 mg/dL (ref 8.9–10.3)
CO2: 26 mmol/L (ref 22–32)
Chloride: 104 mmol/L (ref 101–111)
Creatinine, Ser: 0.7 mg/dL (ref 0.61–1.24)
GFR calc Af Amer: >60 mL/min (ref >60)
Glucose, Bld: 100 mg/dL — ABNORMAL HIGH (ref 65–99)
POTASSIUM: 4 mmol/L (ref 3.5–5.1)
SODIUM: 135 mmol/L (ref 135–145)

## 2016-02-03 LAB — CBC
HEMATOCRIT: 43 % (ref 39.0–52.0)
HEMOGLOBIN: 14.3 g/dL (ref 13.0–17.0)
MCH: 32 pg (ref 26.0–34.0)
MCHC: 33.3 g/dL (ref 30.0–36.0)
MCV: 96.2 fL (ref 78.0–100.0)
Platelets: 250 10*3/uL (ref 150–400)
RBC: 4.47 MIL/uL (ref 4.22–5.81)
RDW: 12.7 % (ref 11.5–15.5)
WBC: 17.6 10*3/uL — AB (ref 4.0–10.5)

## 2016-02-03 LAB — ACID FAST SMEAR (AFB): ACID FAST SMEAR - AFSCU2: NEGATIVE

## 2016-02-03 LAB — ACID FAST SMEAR (AFB, MYCOBACTERIA)

## 2016-02-03 MED ORDER — SODIUM CHLORIDE 0.9 % IV SOLN: INTRAVENOUS | Status: DC

## 2016-02-03 MED ORDER — ALBUTEROL SULFATE (2.5 MG/3ML) 0.083% IN NEBU: 2.5000 mg | INHALATION_SOLUTION | Freq: Four times a day (QID) | RESPIRATORY_TRACT | Status: DC | PRN

## 2016-02-03 NOTE — Progress Notes (Signed)
1 Day Post-Op Procedure(s) (LRB): VIDEO ASSISTED THORACOSCOPY (Right) LUNG BIOPSY (Right) Subjective: Up in chair Some incisional pain - using PCA PRN  Objective: Vital signs in last 24 hours: Temp:  [97.3 F (36.3 C)-98.6 F (37 C)] 98.1 F (36.7 C) (11/02 0739) Pulse Rate:  [73-95] 85 (11/02 0739) Cardiac Rhythm: Normal sinus rhythm (11/02 0742) Resp:  [10-26] 19 (11/02 0739) BP: (85-131)/(63-82) 119/67 (11/02 0739) SpO2:  [92 %-100 %] 94 % (11/02 0739) Arterial Line BP: (101-150)/(57-77) 101/57 (11/02 0055)  Hemodynamic parameters for last 24 hours:    Intake/Output from previous day: 11/01 0701 - 11/02 0700 In: 2010 [P.O.:360; I.V.:1500; IV Piggyback:150] Out: 5102 [Urine:1140; Blood:15; Chest Tube:42] Intake/Output this shift: No intake/output data recorded.  General appearance: alert, cooperative and no distress Neurologic: intact Heart: regular rate and rhythm Lungs: rales bibasilar no air leak  Lab Results:  Recent Labs  01/31/16 1033 02/03/16 0530  WBC 8.0 17.6*  HGB 16.7 14.3  HCT 49.1 43.0  PLT 252 250   BMET:  Recent Labs  01/31/16 1033  NA 137  K 4.1  CL 105  CO2 24  GLUCOSE 133*  BUN 11  CREATININE 0.96  CALCIUM 9.6    PT/INR:  Recent Labs  01/31/16 1033  LABPROT 13.4  INR 1.02   ABG    Component Value Date/Time   PHART 7.429 02/03/2016 0330   HCO3 25.1 02/03/2016 0330   O2SAT 95.6 02/03/2016 0330   CBG (last 3)  No results for input(s): GLUCAP in the last 72 hours.  Assessment/Plan: S/P Procedure(s) (LRB): VIDEO ASSISTED THORACOSCOPY (Right) LUNG BIOPSY (Right) -Doing well POD # 1 No air leak- CT to water seal Dc arterial line and Foley IV to [place] SCD + enoxaparin for DVT prophylaxis OOB and ambulate   LOS: 1 day    [name] 02/03/2016

## 2016-02-04 ENCOUNTER — Inpatient Hospital Stay (HOSPITAL_COMMUNITY): Payer: BLUE CROSS/BLUE SHIELD

## 2016-02-04 LAB — BASIC METABOLIC PANEL
Anion gap: 5 (ref 5–15)
BUN: 10 mg/dL (ref 6–20)
CHLORIDE: 105 mmol/L (ref 101–111)
CO2: 28 mmol/L (ref 22–32)
CREATININE: 0.79 mg/dL (ref 0.61–1.24)
Calcium: 8.9 mg/dL (ref 8.9–10.3)
GFR calc non Af Amer: >60 mL/min (ref >60)
GLUCOSE: 99 mg/dL (ref 65–99)
Potassium: 3.9 mmol/L (ref 3.5–5.1)
Sodium: 138 mmol/L (ref 135–145)

## 2016-02-04 LAB — CBC
HEMATOCRIT: 44 % (ref 39.0–52.0)
HEMOGLOBIN: 14.7 g/dL (ref 13.0–17.0)
MCH: 32.5 pg (ref 26.0–34.0)
MCHC: 33.4 g/dL (ref 30.0–36.0)
MCV: 97.3 fL (ref 78.0–100.0)
Platelets: 235 10*3/uL (ref 150–400)
RBC: 4.52 MIL/uL (ref 4.22–5.81)
RDW: 12.8 % (ref 11.5–15.5)
WBC: 12.9 10*3/uL — ABNORMAL HIGH (ref 4.0–10.5)

## 2016-02-04 MED ORDER — PNEUMOCOCCAL VAC POLYVALENT 25 MCG/0.5ML IJ INJ
0.5000 mL | INJECTION | INTRAMUSCULAR | Status: AC
  Administered 2016-02-05: 0.5 mL via INTRAMUSCULAR
  Filled 2016-02-04: qty 0.5

## 2016-02-04 NOTE — Plan of Care (Signed)
Problem: Health Behavior/Discharge Planning: Goal: Ability to manage health-related needs will improve Outcome: Progressing Patient hoping to be d/c'd tomorrow.   Problem: Pain Managment: Goal: General experience of comfort will improve Outcome: Progressing Currently off PCA pump, no c/o pain.   Problem: Activity: Goal: Risk for activity intolerance will decrease Outcome: Progressing Has walked 3 laps around unit today.

## 2016-02-04 NOTE — Progress Notes (Signed)
2 Days Post-Op Procedure(s) (LRB): VIDEO ASSISTED THORACOSCOPY (Right) LUNG BIOPSY (Right) Subjective: C/o pain right chest- incision/ tube  Objective: Vital signs in last 24 hours: Temp:  [97.3 F (36.3 C)-97.9 F (36.6 C)] 97.9 F (36.6 C) (11/03 0356) Pulse Rate:  [76-85] 78 (11/03 0356) Cardiac Rhythm: Normal sinus rhythm (11/03 0736) Resp:  [18-25] 19 (11/03 0745) BP: (117-125)/(67-75) 125/75 (11/03 0356) SpO2:  [95 %-96 %] 95 % (11/03 0745)  Hemodynamic parameters for last 24 hours:    Intake/Output from previous day: 11/02 0701 - 11/03 0700 In: 695 [P.O.:480; I.V.:215] Out: 1020 [Urine:900; Chest Tube:120] Intake/Output this shift: Total I/O In: 377.5 [P.O.:360; I.V.:17.5] Out: 80 [Chest Tube:80]  General appearance: alert, cooperative and no distress Neurologic: intact Heart: regular rate and rhythm Lungs: rales bibasilar Wound: clean and dry no air leak  Lab Results:  Recent Labs  02/03/16 0530 02/04/16 0410  WBC 17.6* 12.9*  HGB 14.3 14.7  HCT 43.0 44.0  PLT 250 235   BMET:  Recent Labs  02/03/16 0530 02/04/16 0410  NA 135 138  K 4.0 3.9  CL 104 105  CO2 26 28  GLUCOSE 100* 99  BUN 11 10  CREATININE 0.70 0.79  CALCIUM 8.9 8.9    PT/INR: No results for input(s): LABPROT, INR in the last 72 hours. ABG    Component Value Date/Time   PHART 7.429 02/03/2016 0330   HCO3 25.1 02/03/2016 0330   O2SAT 95.6 02/03/2016 0330   CBG (last 3)  No results for input(s): GLUCAP in the last 72 hours.  Assessment/Plan: S/P Procedure(s) (LRB): VIDEO ASSISTED THORACOSCOPY (Right) LUNG BIOPSY (Right) -  Lung biopsy- no air leak- dc chest tube Dc pCA, use PO pain meds ambulating well SCD + enoxaparin for DVT prophylaxis If he continues to progress he can probably go home tomorrow   LOS: 2 days    [name] 02/04/2016

## 2016-02-05 ENCOUNTER — Inpatient Hospital Stay (HOSPITAL_COMMUNITY): Payer: BLUE CROSS/BLUE SHIELD

## 2016-02-05 MED ORDER — OXYCODONE HCL 5 MG PO TABS: 5.0000 mg | ORAL_TABLET | Freq: Four times a day (QID) | ORAL | 0 refills | Status: DC | PRN

## 2016-02-05 NOTE — Progress Notes (Signed)
Reviewed discharge instructions with pt and spouse, including woundcare, s/s of infection, medications and follow up appts. Pt verbalizes understanding. Pt discharged in wheelchair. Consuelo Pandy RN

## 2016-02-05 NOTE — Progress Notes (Signed)
[address]       ,[place] [address]             [phone]      3 Days Post-Op Procedure(s) (LRB): VIDEO ASSISTED THORACOSCOPY (Right) LUNG BIOPSY (Right) Subjective: Feels great and ready to go home.   Objective: Vital signs in last 24 hours: Temp:  [97.5 F (36.4 C)-98.6 F (37 C)] 97.5 F (36.4 C) (11/04 1130) Pulse Rate:  [76-83] 83 (11/04 1130) Cardiac Rhythm: Normal sinus rhythm (11/04 0747) Resp:  [17-23] 22 (11/04 1130) BP: (116-130)/(59-82) 125/64 (11/04 1130) SpO2:  [95 %-96 %] 95 % (11/04 1130)     Intake/Output from previous day: 11/03 0701 - 11/04 0700 In: 1982.5 [P.O.:1920; I.V.:62.5] Out: 1035 [Urine:925; Chest Tube:110] Intake/Output this shift: Total I/O In: 240 [P.O.:240] Out: -   General appearance: alert, cooperative and no distress Heart: regular rate and rhythm, S1, S2 normal, no murmur, click, rub or gallop Lungs: clear to auscultation bilaterally Abdomen: soft, non-tender; bowel sounds normal; no masses,  no organomegaly Extremities: extremities normal, atraumatic, no cyanosis or edema Wound: c/d/i without drainage  Lab Results:  Recent Labs  02/03/16 0530 02/04/16 0410  WBC 17.6* 12.9*  HGB 14.3 14.7  HCT 43.0 44.0  PLT 250 235   BMET:  Recent Labs  02/03/16 0530 02/04/16 0410  NA 135 138  K 4.0 3.9  CL 104 105  CO2 26 28  GLUCOSE 100* 99  BUN 11 10  CREATININE 0.70 0.79  CALCIUM 8.9 8.9    PT/INR: No results for input(s): LABPROT, INR in the last 72 hours. ABG    Component Value Date/Time   PHART 7.429 02/03/2016 0330   HCO3 25.1 02/03/2016 0330   O2SAT 95.6 02/03/2016 0330   CBG (last 3)  No results for input(s): GLUCAP in the last 72 hours.  Assessment/Plan: S/P Procedure(s) (LRB): VIDEO ASSISTED THORACOSCOPY (Right) LUNG BIOPSY (Right)  Follow lung biopsy pathology Labs stable. WBC trending down.  Ambulating  Chest xray showed persistent small right apical pneumothorax If  cleared by attending then will send home today with a follow-up chest xray in the office in 2 weeks.    LOS: 3 days    Elgie Collard 02/05/2016

## 2016-02-05 NOTE — Discharge Summary (Signed)
Physician Discharge Summary  Patient ID: Shaun [name] MRN: [id number] DOB/AGE: Aug 30, 1955 60 y.o.  Admit date: 02/02/2016 Discharge date: 02/10/2016  Admission Diagnoses: Interstitial lung disease   Discharge Diagnoses:  Active Problems:   ILD (interstitial lung disease) ([place])   Discharged Condition: good  HPI:  Shaun [name] is a 60 year old man with a past medical history significant for hyperlipidemia. He saw Dr. Brigitte Pulse in February for an annual exam. She noted abnormal of breath sounds in order to chest x-ray. Chest x-ray was abnormal as well as a follow-up. He was referred to Dr. Lamonte Sakai who did a CT of the chest. It showed changes consistent with interstitial lung disease.  He recently had a follow-up CT which suggested mild interval progression. Pulmonary function testing showed some obstruction but an FEV1 of 2.100 (65%) which increased to 3.00 (93%).  Overall Shaun [name] feels fairly well. His appetite is good. He has not lost any weight. He has an occasional cough, but nothing out of the ordinary. He denies any wheezing and has not been using his rescue inhaler. He denies any chest pain, pressure, or tightness with exertion. He walked up to 4 flights of stairs to our office and did say he was short of breath when he got to the top  Hospital Course:  On 02/02/2016 Shaun [name] underwent a VATs with right lung biopsy and wedge resection by Dr. Roxan Hockey. He tolerated the procedure well and was transferred to [place]. On POD 1 he had some anticipated surgical pain. His chest tube was placed to water seal since it did not have an air leak. We discontinued his lines and foley. He was started on lovenox for DVT prophylaxis. POD 2 his chest tube was discontinued and he was ambulating in the halls. He was weaned off supplemental oxygen. His chest xray showed a persistent small apical pneumothorax which is stable. It was determined that he could go home.   Consults: None  Significant  Diagnostic Studies: CLINICAL DATA:  Interstitial lung disease. Pt had a right lung biopsy on 02/02/2016.  EXAM: CHEST  2 VIEW  COMPARISON:  02/04/2016  FINDINGS: Small right apical pneumothorax is without change from the previous study.  Persistent interstitial thickening is noted most evident in the lung bases, consistent with interstitial lung disease. A pulmonary anastomosis staple line crosses the right upper lobe, also stable. No evidence of pneumonia or pulmonary edema.  Right internal jugular central venous line is stable with its tip in the lower superior vena cava.  IMPRESSION: 1. No change from the previous day's study. 2. Persistent small right apical pneumothorax.   Electronically Signed   By: Lajean Manes M.D.   On: 02/05/2016 08:17      Treatments: NAME:  Shaun [name]             ACCOUNT NO.:  [id number]  MEDICAL RECORD NO.:  [id number]  LOCATION:  3S07C                        FACILITY:  [facility]  PHYSICIAN:  Revonda Standard. Roxan Hockey, M.D.DATE OF BIRTH:  June 15, 1955  DATE OF PROCEDURE:  02/02/2016 DATE OF DISCHARGE:                              OPERATIVE REPORT   PREOPERATIVE DIAGNOSIS:  Interstitial lung disease.  POSTOPERATIVE DIAGNOSIS:  Interstitial lung disease.  PROCEDURES:  Right video-assisted thoracoscopy; lung biopsy of right  upper, middle and lower lobes; placement of On-Q local anesthetic catheter.  SURGEON:  Revonda Standard. Roxan Hockey, M.D.  ASSISTANT:  John Giovanni, P.A.-C.  ANESTHESIA:  General.  FINDINGS:  Abnormal-appearing lung, multiple small blebs.  Frozen section showed interstitial lung disease.  Discharge Exam: Blood pressure 125/64, pulse 83, temperature 97.5 F (36.4 C), temperature source Oral, resp. rate (!) 22, height _0  (1.702 m), weight 153 lb (69.4 kg), SpO2 95 %. General appearance: alert, cooperative and no distress Resp: clear to auscultation bilaterally Cardio: regular rate and rhythm,  S1, S2 normal, no murmur, click, rub or gallop Extremities: extremities normal, atraumatic, no cyanosis or edema Skin: Skin color, texture, turgor normal. No rashes or lesions  Wound: clean and dry  Disposition: 01-Home or Self Care     Medication List    TAKE these medications   albuterol 108 (90 Base) MCG/ACT inhaler Commonly known as:  PROVENTIL HFA;VENTOLIN HFA Inhale 2 puffs into the lungs every 6 (six) hours as needed for wheezing or shortness of breath.   aspirin EC 81 MG tablet Take 81 mg by mouth daily.   Chlorpheniramine-Pseudoeph 4-60 MG Tabs Take 1 tablet by mouth 2 (two) times daily as needed (for allergies.).   multivitamin with minerals Tabs tablet Take 1 tablet by mouth daily.   oxyCODONE 5 MG immediate release tablet Commonly known as:  Oxy IR/ROXICODONE Take 1 tablet (5 mg total) by mouth every 6 (six) hours as needed for severe pain.   simvastatin 10 MG tablet Commonly known as:  ZOCOR Take 10 mg by mouth daily at 6 PM.      Follow-up Information    SHAW,KIMBERLEE, MD. Call in 1 day(s).   Specialty:  Family Medicine Contact information: [address] [place] [address] [phone]        [name], MD. Call in 2 day(s).   Specialty:  Cardiothoracic Surgery Why:  Please call monday and schedule an appointment for 2 weeks with a follow-up chest xray Contact information: [place] [address] [phone]        nursing. Call in 1 day(s).   Why:  please call Dr. Celestia Khat office for a suture removal appointment next week since just got chest tubes out yesterday. Contact information: please call Dr. Celestia Khat office for a suture removal appointment next week since just got chest tubes out yesterday.          Signed: Elgie Collard 02/10/2016, 12:18 PM

## 2016-02-05 NOTE — Op Note (Signed)
NAMESHEROD, CISSE             ACCOUNT NO.:  [id number]  MEDICAL RECORD NO.:  [id number]  LOCATION:  3S07C                        FACILITY:  [facility]  PHYSICIAN:  Revonda Standard. Roxan Hockey, M.D.DATE OF BIRTH:  [birth year]  DATE OF PROCEDURE:  02/02/2016 DATE OF DISCHARGE:                              OPERATIVE REPORT   PREOPERATIVE DIAGNOSIS:  Interstitial lung disease.  POSTOPERATIVE DIAGNOSIS:  Interstitial lung disease.  PROCEDURES:   Right video-assisted thoracoscopy;  Lung biopsy of right upper, middle and lower lobes;  Placement of On-Q local anesthetic catheter.  SURGEON:  Revonda Standard. Roxan Hockey, M.D.  ASSISTANT:  John Giovanni, P.A.-C.  ANESTHESIA:  General.  FINDINGS:  Abnormal-appearing lung, multiple small blebs.  Frozen section showed interstitial lung disease.  CLINICAL NOTE:  Mr. Kamath is a 60 year old man who has been generally healthy. He recently was noted on exam to have abnormal breath sounds.  A chest x-ray was abnormal.  CT showed findings consistent with interstitial lung disease.  A followup CT showed mild interval progression.  The patient was referred for lung biopsy for definitive diagnosis.  The indications, risks, benefits, and alternatives were discussed in detail with the patient.  He understood and accepted the risks and agreed to proceed.  OPERATIVE NOTE:  Mr. Deroy was brought to the preoperative holding area on February 02, 2016.  Anesthesia placed a central line and arterial blood pressure monitoring line. Intravenous antibiotics were administered.  He was taken to the operating room, anesthetized, and intubated with a double-lumen endotracheal tube.  A Foley catheter was placed.  Sequential compression devices were placed on the calves for DVT prophylaxis.  He was placed in a left lateral decubitus position and the right chest was prepped and draped in usual sterile fashion.  Single lung ventilation of the left lung was initiated and was  tolerated well throughout the procedure.  An incision was made in the seventh intercostal space in the midaxillary line.  A 5-mm port was inserted into the chest and the thoracoscope was advanced into the chest.  A working incision, 5 cm in length, was made in the fourth interspace anterolaterally.  No rib spreading was performed during the procedure.  The lower lobe was inspected. It was grossly abnormal with a slightly nodular appearance.  There were multiple blebs noted.  A wedge resection was performed with sequential firings of an Echelon endoscopic stapler.  There was good hemostasis at the staple line.  The specimen was removed and was sent for frozen section.  While awaiting those results, biopsies were performed of the upper and middle lobes as well.  A small portion of one of the biopsies was sent for AFB and fungal cultures.  A stab incision was made posteriorly and On-Q local anesthetic catheter was tunneled into a subpleural location. It was primed with 5 mL of 0.5% Marcaine.  The frozen section returned showing interstitial lung disease. Definitive characterization, will await final pathology.  A final inspection was made.  There was no bleeding from the staple lines.  The port was removed and a 28-French chest tube was placed through the original stab incision.  The right lung was reinflated.  The working incision was closed in  three layers with a #1 Vicryl fascial suture.  The subcutaneous tissue and skin were closed in standard fashion.  All sponge, needle, and instrument counts were correct at the end of the procedure.  The patient was taken from the operating room to the postanesthetic care unit in good condition.     Revonda Standard Roxan Hockey, M.D.     SCH/MEDQ  D:  02/04/2016  T:  02/05/2016  Job:  [id number]

## 2016-02-05 NOTE — Discharge Instructions (Signed)
VATs, Care After Refer to this sheet in the next few weeks. These instructions provide you with information on caring for yourself after your procedure. Your caregiver may also give you more specific instructions. Your procedure has been planned according to current medical practices, but problems sometimes occur. Call your caregiver if you have any problems or questions after your procedure. HOME CARE INSTRUCTIONS   Only take over-the-counter or prescription medications as directed.  Only take pain medications (narcotics) as directed.  Do not drive until your caregiver approves. Driving while taking narcotics or soon after surgery can be dangerous, so discuss the specific timing with your caregiver.  Avoid activities that use your chest muscles, such as lifting heavy objects, for at least 3-4 weeks.   Take deep breaths to expand the lungs and to protect against pneumonia.  Do breathing exercises as directed by your caregiver. If you were given an incentive spirometer to help with breathing, use it as directed.  You may resume a normal diet and activities when you feel you are able to or as directed.  Do not take a bath until your caregiver says it is OK. Use the shower instead.   Keep the bandage (dressing) covering the area where the chest tube was inserted (incision site) dry for 48 hours. After 48 hours, remove the dressing unless there is new drainage.  Remove dressings as directed by your caregiver.  Change dressings if necessary or as directed.  Keep all follow-up appointments. It is important for you to see your caregiver after surgery to discuss appropriate follow-up care and surveillance, if it is necessary. SEEK MEDICAL CARE:  You feel excessive or increasing pain at an incision site.  You notice bleeding, skin irritation, drainage, swelling, or redness at an incision site.  There is a bad smell coming from an incision or dressing.  It feels like your heart is  fluttering or beating rapidly.  Your pain medication does not relieve your pain. SEEK IMMEDIATE MEDICAL CARE IF:   You have a fever.   You have chest pain.  You have a rash.  You have shortness of breath.  You have trouble breathing.   You feel weak, lightheaded, dizzy, or faint.  MAKE SURE YOU:   Understand these instructions.   Will watch your condition.   Will get help right away if you are not doing well or get worse.   This information is not intended to replace advice given to you by your health care provider. Make sure you discuss any questions you have with your health care provider.   Document Released: 07/15/2012 Document Revised: 04/10/2014 Document Reviewed: 07/15/2012 Elsevier Interactive Patient Education [employer].

## 2016-02-08 ENCOUNTER — Encounter (HOSPITAL_COMMUNITY): Payer: Self-pay

## 2016-02-08 ENCOUNTER — Telehealth: Payer: Self-pay | Admitting: Emergency Medicine

## 2016-02-08 DIAGNOSIS — J849 Interstitial pulmonary disease, unspecified: Secondary | ICD-10-CM

## 2016-02-08 NOTE — Telephone Encounter (Signed)
Called and spoke to pt. Appt made with MR on 02/22/16, pt aware that blood work is needed prior to visit. Orders placed. Pt verbalized understanding and denied any further questions or concerns at this time.

## 2016-02-08 NOTE — Telephone Encounter (Signed)
I can see him 8.45am x 15 min but before that needs to finihsh ACCP ILD question. Also needs ANA, RF, CCP, Scl-70 antibody test done

## 2016-02-08 NOTE — Telephone Encounter (Signed)
-----  Message from [name], MD sent at 02/07/2016  2:51 PM EST ----- Leonor Liv and Aaron Edelman  This is Mr. Sahr' path report. It turned out to be UIP.  Richardson Landry ----- Message ----- From: Interface, Lab In [facility] Sent: 02/07/2016  10:42 AM To: [name], MD

## 2016-02-08 NOTE — Telephone Encounter (Signed)
MR your first 30 min opening is 04/19/16, please advise if this is ok. Thanks.

## 2016-02-08 NOTE — Telephone Encounter (Signed)
I spoke with the patient today by phone. Reviewed the biopsy results with him which show UIP. He continues to be asymptomatic despite his interstitial disease and also some possible obstruction noted on his original spirometry. I would like for him to see Dr. Chase Caller regarding possible initiation of antibiotic therapy. We'll arrange for him to be referred.

## 2016-02-10 ENCOUNTER — Ambulatory Visit (INDEPENDENT_AMBULATORY_CARE_PROVIDER_SITE_OTHER): Payer: Self-pay

## 2016-02-10 DIAGNOSIS — Z4802 Encounter for removal of sutures: Secondary | ICD-10-CM

## 2016-02-10 DIAGNOSIS — J849 Interstitial pulmonary disease, unspecified: Secondary | ICD-10-CM

## 2016-02-10 NOTE — Progress Notes (Signed)
Removed 1 suture from chest tube incision site with no signs of infection and patient tolerated well. 

## 2016-02-14 ENCOUNTER — Telehealth: Payer: Self-pay | Admitting: Internal Medicine

## 2016-02-14 NOTE — Telephone Encounter (Signed)
Lm to inform pt that he does not need to fast for ordered labs. Advise pt to give [place] a call back with any further questions or concerns.  Nothing further needed.

## 2016-02-15 ENCOUNTER — Other Ambulatory Visit: Payer: BLUE CROSS/BLUE SHIELD

## 2016-02-15 DIAGNOSIS — J849 Interstitial pulmonary disease, unspecified: Secondary | ICD-10-CM | POA: Diagnosis not present

## 2016-02-16 LAB — ANA: ANA: NEGATIVE

## 2016-02-16 LAB — CYCLIC CITRUL PEPTIDE ANTIBODY, IGG

## 2016-02-16 LAB — ANTI-SCLERODERMA ANTIBODY: SCLERODERMA (SCL-70) (ENA) ANTIBODY, IGG: NEGATIVE

## 2016-02-16 LAB — RHEUMATOID FACTOR: Rhuematoid fact SerPl-aCnc: <14 IU/mL (ref <14)

## 2016-02-22 ENCOUNTER — Ambulatory Visit (INDEPENDENT_AMBULATORY_CARE_PROVIDER_SITE_OTHER): Payer: BLUE CROSS/BLUE SHIELD | Admitting: Internal Medicine

## 2016-02-22 ENCOUNTER — Encounter: Payer: Self-pay | Admitting: Internal Medicine

## 2016-02-22 VITALS — BP 120/66 | HR 88 | Ht 67.0 in | Wt 149.0 lb

## 2016-02-22 DIAGNOSIS — J84112 Idiopathic pulmonary fibrosis: Secondary | ICD-10-CM | POA: Diagnosis not present

## 2016-02-22 MED ORDER — PANTOPRAZOLE SODIUM 40 MG PO TBEC: 40.0000 mg | DELAYED_RELEASE_TABLET | Freq: Every day | ORAL | 3 refills | Status: DC

## 2016-02-22 NOTE — Progress Notes (Signed)
Subjective:    Patient ID: Shaun [name], male    DOB: 1956-01-28, 60 y.o.   MRN: [id number]  HPI   OV 02/22/2016  Chief Complaint  Patient presents with  . Pulmonary Consult    Pt referred by Dr.Byrum for ILD. Pt states he feels he is still recovering from the lung biopsy. Pt denies cough and CP/tightness.     2nd opinionand transfer of care for ILD/UIP - IPF from DR Byrum. Presents with his wife. They related to Dr. Stanford Breed cardiologist   60 year old male found to have crackles on physical exam and subsequently surgical lung biopsy 02/02/2016 showed UIP as read by pathologist in [place].  [employer] of chest physicians interstitial lung disease questionnaire  Symptoms: He does not cough and is not troubled by shortness of breath except with strenuous exercise. Onset is only "recently" Past medical history: Positive for chronic sinus drainage mild. Recently after surgical lung biopsy was being a few bone unintentional weight loss. Otherwise negative Personal exposure history: He smoked occasional cigarettes having grown up on her tobacco farm starting at 38 including when he was age 57.  Family history: All the male members in his family who smoke developed COPD but also did not smoke to the pulmonary fibrosis especially his father and his paternal uncles. His dad was diagnosed with pulmonary fibrosis at age of 88 in 05/20/01 and died from progressive hypoxemic respiratory failure after what sounds like an IPF flare in 05-20-05.  Home exposure history: He is lived in the same house since May 21, 1987. The home does not have a humidifier or sound or hot tub or Jacuzzi. He is not exposed to any birds. 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 Approximately 30 years of work ending 4 years ago used to  cut through dry walls and was exposed to concrete dust and drywall dust and possibly asbestos. HE is to cut through insulation safe. 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 Did not work in a minor quarry a Teacher, music on Agricultural engineer. Denies any exposure to birds of feathers  Autoimmune history: Is negative.  History of exposure to pulmonary toxic drugs: Negative   Lab work - April 2017 and November 2017 extensive autoimmune and vascular this panel is negative  - High resolution CT scan of the chest August 2017: UIP pattern that is classic - personally visualized. There is also 5 mm right lower lobe lung nodule and a 5.9 cm left-sided lower bulla [stable compared to April 2017]   Severity - Pulmonary function test 08/02/2015: Isolated reduction in diffusion capacity 17.33/61%. Otherwise FVC 4 L/93% and normal. Total lung capacity 5.9 L/91% and normal - Walking desaturation test 185 feet 3 laps on room air in the office 02/22/2016: Did not desaturate but gets symptomatic   Emotional state: He is extremely worried about the diagnosis of IPF. This is particularly so because he has a strong family history and his father died within 10 years usually was diagnosed much order at age 9. Patient is worried that he got diagnosed with the same problem and much younger age of 72 and feels that he only will have a few years to live. Multiple times  during the visit he was teary-eyed   Review of Systems  Constitutional: Negative for fever and unexpected weight change.  HENT: Positive for congestion. Negative for dental problem, ear pain, nosebleeds, postnasal drip, rhinorrhea, sinus pressure, sneezing, sore throat and trouble swallowing.   Eyes: Negative for redness and itching.  Respiratory: Negative for cough, chest tightness, shortness of breath and wheezing.   Cardiovascular: Negative for palpitations and  leg swelling.  Gastrointestinal: Negative for nausea and vomiting.  Genitourinary: Negative for dysuria.  Musculoskeletal: Negative for joint swelling.  Skin: Negative for rash.  Neurological: Negative for headaches.  Hematological: Does not bruise/bleed easily.  Psychiatric/Behavioral: Negative for dysphoric mood. The patient is not nervous/anxious.      has a past medical history of Dyspnea; Hyperlipemia; and Pneumonia.   reports that he has never smoked. He has never used smokeless tobacco.  Past Surgical History:  Procedure Laterality Date  . COLONOSCOPY WITH PROPOFOL N/A 09/21/2014   Procedure: COLONOSCOPY WITH PROPOFOL;  Surgeon: Garlan Fair, MD;  Location: [facility];  Service: Endoscopy;  Laterality: N/A;  . FOREARM SURGERY     excision of birth mark"child"  . HERNIA REPAIR     RIH-open  . KNEE ARTHROTOMY Left    bursa excision  . LUNG BIOPSY Right 02/02/2016   Procedure: LUNG BIOPSY;  Surgeon: [name], MD;  Location: [facility];  Service: Thoracic;  Laterality: Right;  . NASAL SEPTUM SURGERY    . VIDEO ASSISTED THORACOSCOPY Right 02/02/2016   Procedure: VIDEO ASSISTED THORACOSCOPY;  Surgeon: [name], MD;  Location: [facility];  Service: Thoracic;  Laterality: Right;  . WRIST GANGLION EXCISION Left     Allergies  Allergen Reactions  . Penicillins Other (See Comments)    UNSPECIFIED REACTION OF CHILDHOOD Has patient had a PCN reaction causing immediate rash, facial/tongue/throat swelling, SOB or lightheadedness with hypotension:unsure Has patient had a PCN reaction causing severe rash involving mucus membranes or skin necrosis:unsure Has patient had a PCN reaction that required hospitalization:No Has patient had a PCN reaction occurring within the last 10 years:No If all of the above answers are "NO", then may proceed with Cephalosporin use.     Immunization History  Administered Date(s) Administered  . Influenza-Unspecified 11/02/2015  .  Pneumococcal Polysaccharide-23 02/05/2016  . Zoster 11/02/2015    Family History  Problem Relation Age of Onset  . Lung cancer Mother   . Colon cancer Mother   . Pulmonary fibrosis Father      Current Outpatient Prescriptions:  .  albuterol (PROVENTIL HFA;VENTOLIN HFA) 108 (90 Base) MCG/ACT inhaler, Inhale 2 puffs into the lungs every 6 (six) hours as needed for wheezing or shortness of breath., Disp: 1 Inhaler, Rfl: 5 .  Ascorbic Acid (VITAMIN C) 1000 MG tablet, Take 1,000 mg by mouth daily., Disp: , Rfl:  .  aspirin EC 81 MG tablet, Take 81 mg by mouth daily., Disp: , Rfl:  .  Chlorpheniramine-Pseudoeph 4-60 MG TABS, Take 1 tablet by mouth 2 (two) times daily as needed (for allergies.)., Disp: , Rfl:  .  Multiple Vitamin (MULTIVITAMIN WITH MINERALS) TABS tablet, Take 1 tablet by mouth daily., Disp: , Rfl:  .  simvastatin (ZOCOR) 10 MG tablet, Take 10 mg by mouth daily at 6 PM. , Disp: , Rfl: 0      Objective:   Physical Exam  Vitals:   02/22/16 0858  BP: 120/66  Pulse: 88  SpO2: 97%  Weight: 149 lb (67.6 kg)  Height: 5' 7" (  1.702 m)    Discussion only visit  Estimated body mass index is 23.34 kg/m as calculated from the following:   Height as of this encounter: _0  (1.702 m).   Weight as of this encounter: 149 lb (67.6 kg).       Assessment & Plan:  1. Familial idiopathic pulmonary fibrosis ([place]) 2. IPF (idiopathic pulmonary fibrosis) ([place])  He has idiopathic pulmonary fibrosis of the family variety. This affects-20% of all idiopathic pulmonary fibrosis. I think the family history associated with his nonspecific exposures at his occupation probably contributing to his IPF diagnosis. It is possible that he has short telomerase abnormality contributing to the familial fibrosis  ILD - Diagnosis date (when patient/family first told) - date of biopsy 02/02/2016. Date of explanation of 02/08/2016 by Dr. Baltazar Apo of biopsy results and full explanation of diagnosis  02/22/2016 by Dr. Chase Caller - SYmptom onset: In the last few months - Radiology at diagnosis: 07/08/2015 and high resolution CT chest 11/23/2015 - Histopathology at diagnosis: UIP 02/02/2016 read at [place] - Etiology: Familial and work exposure - Co-morbidities: None but possible acid reflux present and emphysema and 61m right lower lobe lung nodule and 5.5 cm left lower lobe bulla present on CT - Severity: Very mild with just isolated reduction in diffusion capacity of 61% - Trajectory: Unclear. GAP Score is 1 which implies less than 6% one-year mortality rate. Do not have access to blood MMP-7 testing that could help differentiate prognosis even further. I did explain to him that our goal would be to try to keep his disease stable with anti-fibrotic therapy and try to get him into research trials and keep him going to new drugs are approved over the next few to several years  Rx - Active therapy: Discussed anti-fibrotic therapy as below. Will start Ofev after long discussion - Oxygen supplementation: not indicated - VAccination: Will need Prevnar will address at follow-up - Rehab" briefly mentioned to her but will address at follow-up - Rx of co-morbidities such as GERD: Recommended Protonix - PF Support group - briefly mentioned but will get into details at follow-up - Clinical Trials = briefly mentioned but will get into details at follow-up. He seemed overwhelmed  -Lung Transplant - not mentioned that this visit but will discuss at follow-up - Emphysema: We'll check alpha 1 at follow-up - Lung nodule: Needs high-resolution CT chest in one year which we will order at follow-up   Follow-up 6 weeks or sooner if needed  > 50% of this > 40 min visit spent in face to face counseling or/and coordination of care    Dr. MBrand Males M.D., FWest Tennessee Healthcare - Volunteer HospitalC.P Pulmonary and Critical Care Medicine Staff Physician CEmbarrassPulmonary and Critical Care Pager: [phone] If no  answer or between  15:00h - 7:00h: call [phone]  02/22/2016 1:52 PM  ......................................... Both drugs OFEV and Esbriet only slow down progression, 1 out of 6 patients  - this means extension in quality of life but no difference in symptoms  - no study directly compares the 2 drugs but efficacy roughly equal at 1 year time point   - OFEV  - - time to first exacerbation possibly reduced in one trial but not in another - twice daily, no titration, potentially more convenient dosing  - no need for sunscreen  - high chance of mild diarrhea but low chance of significant diarrhea needing to stop medication.   - Rx diarrhea with lomotil -  slight increase in heart attack risk and theoretical increase in bleeding risk,   - need monthly blood work for 3 months and then every 6 months - monitor liver function   - ESBRIET  - 3 pill three times daily, slow titration.  - Need to wean sunscreen  - Some chance of nausea and anorexia with small chance for diarrhea  - no known heart attack risk - no known bleeding risk,   - need monthly blood work for 6 months - monitor liver function - possible mortality benefit in pooled analysis  - larger world wide experience  Disclosure: Dr Chase Caller has participated in trials with Esbriet and has been compensated by Bluford Kaufmann / Celso Amy

## 2016-02-22 NOTE — Patient Instructions (Addendum)
ICD-9-CM ICD-10-CM   1. Familial idiopathic pulmonary fibrosis (HCC) 516.31 J84.112   2. IPF (idiopathic pulmonary fibrosis) ([place]) 516.31 J84.112    Diagnosis is IPF - familial variety  PLAN - take booklet with you - visit [email or web address] for most reliable information - goal is to keep you stable forever - start Ofev as discussed - walk test on room air 02/22/2016 - will monitor your weight - start protonix 37m daily empty stomach - some research this helps in stabilizing IPF - in future will discuss rehab, support group, and research trials, transplant options etc.,  Followup 6 weeks with Dr RChase Calleror APP to review progress   -a t fu address the mild associated emphysema componenet - check Alpha 1 (probably only need alb prn due to very mild dyspnea only)  - at fu  Order repeat HRCT 1 year for the 540mnodule

## 2016-02-23 ENCOUNTER — Telehealth: Payer: Self-pay | Admitting: Internal Medicine

## 2016-02-23 ENCOUNTER — Telehealth: Payer: Self-pay | Admitting: Emergency Medicine

## 2016-02-23 LAB — CULTURE, FUNGUS WITHOUT SMEAR

## 2016-02-23 NOTE — Telephone Encounter (Signed)
Ofev form faxed to [place]. Will await PA.

## 2016-02-23 NOTE — Telephone Encounter (Signed)
Pt aware to take protonix 30 min before first meal of the day. Ppt voiced understanding and had no further questions. Nothing further needed.

## 2016-02-28 ENCOUNTER — Other Ambulatory Visit: Payer: Self-pay

## 2016-02-28 DIAGNOSIS — J849 Interstitial pulmonary disease, unspecified: Secondary | ICD-10-CM

## 2016-02-29 ENCOUNTER — Encounter: Payer: Self-pay | Admitting: Thoracic Surgery (Cardiothoracic Vascular Surgery)

## 2016-02-29 ENCOUNTER — Ambulatory Visit (INDEPENDENT_AMBULATORY_CARE_PROVIDER_SITE_OTHER): Payer: Self-pay | Admitting: Thoracic Surgery (Cardiothoracic Vascular Surgery)

## 2016-02-29 ENCOUNTER — Ambulatory Visit
Admission: RE | Admit: 2016-02-29 | Discharge: 2016-02-29 | Disposition: A | Discharge status: Home / Self Care | Payer: BLUE CROSS/BLUE SHIELD | Source: Ambulatory Visit | Attending: Thoracic Surgery (Cardiothoracic Vascular Surgery) | Admitting: Thoracic Surgery (Cardiothoracic Vascular Surgery)

## 2016-02-29 VITALS — BP 115/72 | HR 80 | Resp 16 | Ht 67.0 in | Wt 159.0 lb

## 2016-02-29 DIAGNOSIS — J84112 Idiopathic pulmonary fibrosis: Secondary | ICD-10-CM

## 2016-02-29 DIAGNOSIS — Z9889 Other specified postprocedural states: Secondary | ICD-10-CM

## 2016-02-29 DIAGNOSIS — J849 Interstitial pulmonary disease, unspecified: Secondary | ICD-10-CM

## 2016-02-29 DIAGNOSIS — R079 Chest pain, unspecified: Secondary | ICD-10-CM | POA: Diagnosis not present

## 2016-02-29 DIAGNOSIS — Z09 Encounter for follow-up examination after completed treatment for conditions other than malignant neoplasm: Secondary | ICD-10-CM

## 2016-02-29 NOTE — Progress Notes (Signed)
[address]       [place] [address]             [phone]       HPI: Mr. Shaun [name] returns for a scheduled postoperative follow-up visit.  He is a 60 year old man who had a lung biopsy for interstitial lung disease on 02/02/2016. His postoperative course was unremarkable and he went home on day 3.  His pathology showed UIP.  He has some incisional discomfort. He never filled his narcotic prescription after discharge. He will occasionally take Tylenol, but for the most part he has not required any medication. He has been driving and he has started back to work.  He saw Dr. Chase Caller and is being evaluated for treatment with Ofev.  Past Medical History:  Diagnosis Date  . Dyspnea   . Hyperlipemia   . Pneumonia   \   Current Outpatient Prescriptions  Medication Sig Dispense Refill  . albuterol (PROVENTIL HFA;VENTOLIN HFA) 108 (90 Base) MCG/ACT inhaler Inhale 2 puffs into the lungs every 6 (six) hours as needed for wheezing or shortness of breath. 1 Inhaler 5  . Ascorbic Acid (VITAMIN C) 1000 MG tablet Take 1,000 mg by mouth daily.    Marland Kitchen aspirin EC 81 MG tablet Take 81 mg by mouth daily.    . Chlorpheniramine-Pseudoeph 4-60 MG TABS Take 1 tablet by mouth 2 (two) times daily as needed (for allergies.).    Marland Kitchen Multiple Vitamin (MULTIVITAMIN WITH MINERALS) TABS tablet Take 1 tablet by mouth daily.    . pantoprazole (PROTONIX) 40 MG tablet Take 1 tablet (40 mg total) by mouth daily. 30 tablet 3  . simvastatin (ZOCOR) 10 MG tablet Take 10 mg by mouth daily at 6 PM.   0   No current facility-administered medications for this visit.     Physical Exam BP 115/72 (BP Location: Right Arm, Patient Position: Sitting, Cuff Size: Normal)   Pulse 80   Resp 16   Ht _0  (1.702 m)   Wt 159 lb (72.1 kg)   SpO2 98% Comment: ON RA  BMI 24.59 kg/m  60 year old man in no acute distress Alert and oriented 3 with no focal deficits Cardiac regular rate and rhythm normal  S1 and S2 Lungs with crackles at bases, otherwise clear Incisions healing well  Diagnostic Tests: CHEST  2 VIEW  COMPARISON:  Chest x-ray of February 05, 2016  FINDINGS: The lungs are adequately inflated. The interstitial markings are mildly prominent greatest on the right but these have improved significantly since the previous study. There is no pneumothorax or pleural effusion. The heart and pulmonary vascularity are normal. The mediastinum is normal in width. The observed bony thorax is unremarkable.  IMPRESSION: Improved appearance of the interstitial markings on the right. No acute pneumonia. No residual pneumothorax and no significant pleural effusion.   Electronically Signed   By: David  Martinique M.D.   On: 02/29/2016 14:05 I personally reviewed the chest x-ray and concur with the findings noted above  Impression: 60 year old gentleman who is now about a month out from a lung biopsy. He is already driving and is back at work. He does have some incisional discomfort, but is not requiring any narcotics. He may use Tylenol or nonsteroidal anti-inflammatories as needed for pain.  I did advise not do any heavy lifting for a few more weeks just to avoid pain.  He has UIP and pulmonary fibrosis. He will be followed by Dr. Chase Caller for that.  Plan: I will be happy to see Mr. Elza back any time if I can be of any further assistance with his care.  [name], MD [facility] [phone]

## 2016-03-03 NOTE — Telephone Encounter (Signed)
Received fax from [facility] for pt assistance for pt. Form has been completed and faxed back to [facility]. Will await PA.

## 2016-03-07 NOTE — Telephone Encounter (Signed)
Patient is calling as he has a patient assistance form for the Ofev.  He states it needs to be completed and needs an RX signed by MR and the form signed by MR.  Please call patient and advise if this is the same form we have completed and sent in or something else, CB is [phone].

## 2016-03-07 NOTE — Telephone Encounter (Signed)
Forward to [place] to look into unsure if they would be the same forms

## 2016-03-08 NOTE — Telephone Encounter (Signed)
Called [employer] at [phone] and spoke to [place] and she advised a PA is needed and the form is being faxed to our office. Received fax, this has been completed and faxed back to [facility] with supporting documents. Will await decision.

## 2016-03-10 NOTE — Telephone Encounter (Signed)
Shaun [name]   [employer] called today about the patient assistant form. I informed her from what I see you documented you faxed the form. Well they are stating that they need the fax to go to fax number [phone] Atten: Lidia Collum because it is still showing that they have not received the form. That stated that it is a different fax number on the paper work

## 2016-03-14 NOTE — Telephone Encounter (Signed)
I received a fax that is [employer] to start pt on Ofev prior to insurance approval. This form is needing MR's signature. Once completed I will fax back.

## 2016-03-15 NOTE — Telephone Encounter (Signed)
Called and spoke Almyra Free with Ofev [employer] pt assistance program. Almyra Free states the pt has been approved for Ofev from 12.6.17 - 12.6.2018. Almyra Free is need the pt assistance forms faxed back to her at [phone] along with the original [employer] form. This has been faxed to the requested fax number. Nothing further needed at this time.

## 2016-03-16 LAB — ACID FAST CULTURE WITH REFLEXED SENSITIVITIES (MYCOBACTERIA): Acid Fast Culture: NEGATIVE

## 2016-04-05 ENCOUNTER — Ambulatory Visit (INDEPENDENT_AMBULATORY_CARE_PROVIDER_SITE_OTHER): Payer: BLUE CROSS/BLUE SHIELD | Admitting: Adult Health

## 2016-04-05 ENCOUNTER — Other Ambulatory Visit (INDEPENDENT_AMBULATORY_CARE_PROVIDER_SITE_OTHER): Payer: BLUE CROSS/BLUE SHIELD

## 2016-04-05 ENCOUNTER — Encounter: Payer: Self-pay | Admitting: Adult Health

## 2016-04-05 ENCOUNTER — Telehealth: Payer: Self-pay | Admitting: Internal Medicine

## 2016-04-05 VITALS — BP 104/76 | HR 68 | Temp 97.2°F | Ht 67.0 in | Wt 151.2 lb

## 2016-04-05 DIAGNOSIS — J4520 Mild intermittent asthma, uncomplicated: Secondary | ICD-10-CM

## 2016-04-05 DIAGNOSIS — J849 Interstitial pulmonary disease, unspecified: Secondary | ICD-10-CM

## 2016-04-05 DIAGNOSIS — Z006 Encounter for examination for normal comparison and control in clinical research program: Secondary | ICD-10-CM

## 2016-04-05 LAB — HEPATIC FUNCTION PANEL
ALBUMIN: 4.6 g/dL (ref 3.5–5.2)
ALT: 12 U/L (ref 0–53)
AST: 20 U/L (ref 0–37)
Alkaline Phosphatase: 64 U/L (ref 39–117)
Bilirubin, Direct: 0.1 mg/dL (ref 0.0–0.3)
TOTAL PROTEIN: 7.1 g/dL (ref 6.0–8.3)
Total Bilirubin: 0.6 mg/dL (ref 0.2–1.2)

## 2016-04-05 NOTE — Telephone Encounter (Signed)
Spoke with Cloyde Reams, who states Tammy Parrett placed an order for pulm rehab at pt's [facility] today. Per insurance the referral will need to be signed by a MD. I have reordered this referral under MR's name, to be signed.   MR please advise. Thanks.

## 2016-04-05 NOTE — Patient Instructions (Addendum)
Refer to pulmonary rehab .  Continue on OFEV .  Activity as tolerated.  HRCT Chest set up for August 2018  Follow up Dr. Chase Caller in 3 months and As needed

## 2016-04-05 NOTE — Progress Notes (Signed)
_0  ID: Shaun [name], male    DOB: Aug 22, 1955, 61 y.o.   MRN: [id number]  Chief Complaint  Patient presents with  . Follow-up    ILD    Referring provider: Mayra Neer, MD  HPI: 61 yo never smoker followed for ILD /UIP  FH of IPF with Father dx at age 64.   TEST Joya San   Autoimmune history: Is negative.  History of exposure to pulmonary toxic drugs: Negative   Lab work - April 2017 and November 2017 extensive autoimmune and vascular this panel is negative  - High resolution CT scan of the chest August 2017: UIP pattern that is classic - personally visualized. There is also 5 mm right lower lobe lung nodule and a 5.9 cm left-sided lower bulla [stable compared to April 2017] - Pulmonary function test 08/02/2015: Isolated reduction in diffusion capacity 17.33/61%. Otherwise FVC 4 L/93% and normal. Total lung capacity 5.9 L/91% and normal - Walking desaturation test 185 feet 3 laps on room air in the office 02/22/2016: Did not desaturate but gets symptomatic Histopathology at diagnosis: UIP 02/02/2016 read at [facility]  04/05/2016 Follow up  : ILD/UIP  Pt returns for a 6 week follow up . He is followed for biopsy proven ILD/UIP .  He has recently started on OFEV. Starting his 3rd week of therapy. Doing well with only 1 episode of mild diarrhea. Appetite appears to be stable .  He walks on regular basis . Likes to [employer]. We discussed pulmonary rehab Gets winded with hills /inclines but says it is mild dyspnea only . Marland Kitchen No dyspnea at rest.     Allergies  Allergen Reactions  . Penicillins Other (See Comments)    UNSPECIFIED REACTION OF CHILDHOOD Has patient had a PCN reaction causing immediate rash, facial/tongue/throat swelling, SOB or lightheadedness with hypotension:unsure Has patient had a PCN reaction causing severe rash involving mucus membranes or skin necrosis:unsure Has patient had a PCN reaction that required hospitalization:No Has patient had a PCN  reaction occurring within the last 10 years:No If all of the above answers are "NO", then may proceed with Cephalosporin use.     Immunization History  Administered Date(s) Administered  . Influenza-Unspecified 11/02/2015  . Pneumococcal Polysaccharide-23 02/05/2016  . Zoster 11/02/2015    Past Medical History:  Diagnosis Date  . Dyspnea   . Hyperlipemia   . Pneumonia     Tobacco History: History  Smoking Status  . Never Smoker  Smokeless Tobacco  . Never Used   Counseling given: Not Answered   Outpatient Encounter Prescriptions as of 04/05/2016  Medication Sig  . albuterol (PROVENTIL HFA;VENTOLIN HFA) 108 (90 Base) MCG/ACT inhaler Inhale 2 puffs into the lungs every 6 (six) hours as needed for wheezing or shortness of breath.  . Ascorbic Acid (VITAMIN C) 1000 MG tablet Take 500 mg by mouth daily.   Marland Kitchen aspirin EC 81 MG tablet Take 81 mg by mouth daily.  . Chlorpheniramine-Pseudoeph 4-60 MG TABS Take 1 tablet by mouth 2 (two) times daily as needed (for allergies.).  Marland Kitchen Multiple Vitamin (MULTIVITAMIN WITH MINERALS) TABS tablet Take 1 tablet by mouth daily.  . pantoprazole (PROTONIX) 40 MG tablet Take 1 tablet (40 mg total) by mouth daily.  . simvastatin (ZOCOR) 10 MG tablet Take 10 mg by mouth daily at 6 PM.    No facility-administered encounter medications on file as of 04/05/2016.      Review of Systems  Constitutional:   No  weight loss, night sweats,  Fevers, chills, fatigue, or  lassitude.  HEENT:   No headaches,  Difficulty swallowing,  Tooth/dental problems, or  Sore throat,                No sneezing, itching, ear ache, nasal congestion, post nasal drip,   CV:  No chest pain,  Orthopnea, PND, swelling in lower extremities, anasarca, dizziness, palpitations, syncope.   GI  No heartburn, indigestion, abdominal pain, nausea, vomiting, diarrhea, change in bowel habits, loss of appetite, bloody stools.   Resp:    No excess mucus, no productive cough,  No  non-productive cough,  No coughing up of blood.  No change in color of mucus.  No wheezing.  No chest wall deformity  Skin: no rash or lesions.  GU: no dysuria, change in color of urine, no urgency or frequency.  No flank pain, no hematuria   MS:  No joint pain or swelling.  No decreased range of motion.  No back pain.    Physical Exam  BP 104/76 (BP Location: Right Arm, Patient Position: Sitting, Cuff Size: Normal)   Pulse 68   Temp 97.2 F (36.2 C)   Ht _0  (1.702 m)   Wt 151 lb 3.2 oz (68.6 kg)   SpO2 96%   BMI 23.68 kg/m   GEN: A/Ox3; pleasant , NAD, well nourished    HEENT:  [place]/AT,  EACs-clear, TMs-wnl, NOSE-clear, THROAT-clear, no lesions, no postnasal drip or exudate noted.   NECK:  Supple w/ fair ROM; no JVD; normal carotid impulses w/o bruits; no thyromegaly or nodules palpated; no lymphadenopathy.    RESP  Very faint BB crackles ,  no accessory muscle use, no dullness to percussion  CARD:  RRR, no m/r/g, no peripheral edema, pulses intact, no cyanosis or clubbing.  GI:   Soft & nt; nml bowel sounds; no organomegaly or masses detected.   Musco: Warm bil, no deformities or joint swelling noted.   Neuro: alert, no focal deficits noted.    Skin: Warm, no lesions or rashes  Psych:  No change in mood or affect. No depression or anxiety.  No memory loss.  Lab Results:  CBC    Component Value Date/Time   WBC 12.9 (H) 02/04/2016 0410   RBC 4.52 02/04/2016 0410   HGB 14.7 02/04/2016 0410   HCT 44.0 02/04/2016 0410   PLT 235 02/04/2016 0410   MCV 97.3 02/04/2016 0410   MCH 32.5 02/04/2016 0410   MCHC 33.4 02/04/2016 0410   RDW 12.8 02/04/2016 0410    BMET    Component Value Date/Time   NA 138 02/04/2016 0410   K 3.9 02/04/2016 0410   CL 105 02/04/2016 0410   CO2 28 02/04/2016 0410   GLUCOSE 99 02/04/2016 0410   BUN 10 02/04/2016 0410   CREATININE 0.79 02/04/2016 0410   CALCIUM 8.9 02/04/2016 0410   GFRNONAA >60 02/04/2016 0410   GFRAA >60  02/04/2016 0410    BNP No results found for: BNP  ProBNP No results found for: PROBNP  Imaging: No results found.   Assessment & Plan:   Interstitial lung disease ([place]) ILD/UIP (bx proven)  Doing well on OFEV.  Check LFT today .  Refer to pulmonary rehab PVX and Flu vaccine utd. Will need Prevnar next year.  follow up HRCT in 11/2016 .  Will need PFT next summer can discuss on return   Plan  Patient Instructions  Refer to pulmonary rehab .  Continue on OFEV .  Activity as  tolerated.  HRCT Chest set up for August 2018  Follow up Dr. Chase Caller in 3 months and As needed       Asthma Mild with reversibility on PFT  SABA use As needed  -denies any use.   Cont current regjmen      Rexene Edison, NP 04/05/2016

## 2016-04-05 NOTE — Telephone Encounter (Signed)
Left message with Thayer Headings to have Shaun [name] return our call. Will await call back.

## 2016-04-05 NOTE — Assessment & Plan Note (Signed)
ILD/UIP (bx proven)  Doing well on OFEV.  Check LFT today .  Refer to pulmonary rehab PVX and Flu vaccine utd. Will need Prevnar next year.  follow up HRCT in 11/2016 .  Will need PFT next summer can discuss on return   Plan  Patient Instructions  Refer to pulmonary rehab .  Continue on OFEV .  Activity as tolerated.  HRCT Chest set up for August 2018  Follow up Dr. Chase Caller in 3 months and As needed

## 2016-04-05 NOTE — Assessment & Plan Note (Signed)
Mild with reversibility on PFT  SABA use As needed  -denies any use.   Cont current regjmen

## 2016-04-06 NOTE — Progress Notes (Unsigned)
This coordinator met with patient Shaun [name], DOB May 26, 1955, to discuss the IPF PRO Registry and the patients interest in enrolling. This coordinator and the patient spoke over the phone in regards to this registry on [id number] and the details of the registry was discussed. The patient had a routine follow-up visit scheduled with Rexene Edison, NP on [id number], and subject stated he would like to enroll at that time.  After the visit completion with the NP on [id number] the informed consent form was given to the patient to review in a private exam room. Once the subject notified this coordinator that he had completed reviewing the ICF all questions were answered.  The patient decided to wait to enroll until his follow-up visit with Dr. Chase Caller on 17Apr2018, so he can have his wife review the ICF as well.   Rampart Bing, CMA, Study Coordinator

## 2016-04-10 LAB — ALPHA-1 ANTITRYPSIN PHENOTYPE: A-1 Antitrypsin: 132 mg/dL (ref 83–199)

## 2016-04-11 NOTE — Telephone Encounter (Signed)
I am in hospital till next week. Either it waits till next week or have another MD sign it  Thanks  Dr. Brand Males, M.D., [facility].C.P Pulmonary and Critical Care Medicine Staff Physician [place] [facility] Pager: [phone], If no answer or between  15:00h - 7:00h: call [phone]  04/11/2016 10:42 AM

## 2016-04-11 NOTE — Telephone Encounter (Signed)
MR please advise when this order has been signed.  Thanks!

## 2016-04-11 NOTE — Telephone Encounter (Signed)
Order was signed.  Pulm Rehab made aware.  Nothing further needed.

## 2016-04-11 NOTE — Telephone Encounter (Signed)
My apologioes. Please check now if it is signed

## 2016-04-11 NOTE — Telephone Encounter (Signed)
MR this order is electronic, in your inbasket. Please sign as you are able.  Thanks!

## 2016-05-11 ENCOUNTER — Encounter (HOSPITAL_COMMUNITY): Payer: Self-pay | Admitting: *Deleted

## 2016-05-11 NOTE — Progress Notes (Signed)
I had called Paz on 05/09/16 to discuss Outpt. Pulmonary Maintenance Program. He arrived  today  at 0945 to see the gym and the program in progress. We discussed the education classes that he could attend and the benefits of the program. We discussed the Prep Program that is available at the [facility] and we mailed him a brochure about it. Abel said that he would call us if he had any more questions. If I don't hear anything from him I will reach out to him once more.

## 2016-05-18 ENCOUNTER — Telehealth (HOSPITAL_COMMUNITY): Payer: Self-pay | Admitting: *Deleted

## 2016-06-19 ENCOUNTER — Other Ambulatory Visit: Payer: Self-pay | Admitting: Internal Medicine

## 2016-07-06 DIAGNOSIS — Z79899 Other long term (current) drug therapy: Secondary | ICD-10-CM | POA: Diagnosis not present

## 2016-07-06 DIAGNOSIS — R7301 Impaired fasting glucose: Secondary | ICD-10-CM | POA: Diagnosis not present

## 2016-07-06 DIAGNOSIS — J8489 Other specified interstitial pulmonary diseases: Secondary | ICD-10-CM | POA: Diagnosis not present

## 2016-07-06 DIAGNOSIS — Z125 Encounter for screening for malignant neoplasm of prostate: Secondary | ICD-10-CM | POA: Diagnosis not present

## 2016-07-06 DIAGNOSIS — E7800 Pure hypercholesterolemia, unspecified: Secondary | ICD-10-CM | POA: Diagnosis not present

## 2016-07-06 DIAGNOSIS — Z0000 Encounter for general adult medical examination without abnormal findings: Secondary | ICD-10-CM | POA: Diagnosis not present

## 2016-07-18 ENCOUNTER — Ambulatory Visit: Payer: BLUE CROSS/BLUE SHIELD | Admitting: Internal Medicine

## 2016-07-20 DIAGNOSIS — H524 Presbyopia: Secondary | ICD-10-CM | POA: Diagnosis not present

## 2016-07-21 ENCOUNTER — Encounter: Payer: Self-pay | Admitting: Internal Medicine

## 2016-07-21 ENCOUNTER — Ambulatory Visit (INDEPENDENT_AMBULATORY_CARE_PROVIDER_SITE_OTHER): Payer: BLUE CROSS/BLUE SHIELD | Admitting: Internal Medicine

## 2016-07-21 ENCOUNTER — Other Ambulatory Visit (INDEPENDENT_AMBULATORY_CARE_PROVIDER_SITE_OTHER): Payer: BLUE CROSS/BLUE SHIELD

## 2016-07-21 DIAGNOSIS — J84112 Idiopathic pulmonary fibrosis: Secondary | ICD-10-CM

## 2016-07-21 DIAGNOSIS — Z006 Encounter for examination for normal comparison and control in clinical research program: Secondary | ICD-10-CM

## 2016-07-21 LAB — HEPATIC FUNCTION PANEL
ALBUMIN: 4.3 g/dL (ref 3.5–5.2)
ALK PHOS: 72 U/L (ref 39–117)
ALT: 12 U/L (ref 0–53)
AST: 20 U/L (ref 0–37)
Bilirubin, Direct: 0.1 mg/dL (ref 0.0–0.3)
Total Bilirubin: 0.4 mg/dL (ref 0.2–1.2)
Total Protein: 7 g/dL (ref 6.0–8.3)

## 2016-07-21 NOTE — Addendum Note (Signed)
Addended by: Collier Salina on: 07/21/2016 02:07 PM   Modules accepted: Orders

## 2016-07-21 NOTE — Progress Notes (Signed)
lmtcb for pt.

## 2016-07-21 NOTE — Patient Instructions (Signed)
Familial idiopathic pulmonary fibrosis (HCC) Clinically stable but need PFT to assess progression  Plan Continue ofev Check lft 07/21/2016 Do  Pre-bd spiro and dlco only. No lung volume or bd response - any location next few days to weeks At some point will refer to Roma Kayser for IPF genetics Appreciate you participating in PFF support group Appreciate participation in [facility] registry study Appreicate willingness for future research trials; will keep you posted Cotniue aerobic exercises but add weight training Discussed transplant briefly; can discuss more at followup  Followup  - repeat LFT in 1 month - repeat Pre-bd spiro and dlco only. No lung volume or bd response in 3 months - return to see me in 3 months

## 2016-07-21 NOTE — Progress Notes (Signed)
IPF PRO Registry Purpose: To collect data and biological samples that will support future research studies.  Registry will describe current approaches to diagnosis and treatment of IPF, analyze participant characteristics to describe the natural history of the disease, assess quality of life, describe participants interactions with the health care system, describe IPF treatment practices across multiple institutions, and utilize biological samples linked to well characterized IPF participants to identify disease biomarkers.  ------------------------------------------------------------------------------------------ Clinical Research Coordinator / Research RN note : This visit for [name] with DOB: 1955-07-21 on 07/26/2016 for the above protocol is Visit # Enrollment  and is for purpose of research . The consent for this encounter is under Protocol Version 2, 31/Jan/2017and  is currently IRB approved. Subject expressed continued interest and consent in continuing as a study subject. Subject confirmed that there was no  change in contact information (e.g. address, telephone, email). Subject thanked for participation in research and contribution to science.   All required procedures were completed per the above protocol. Refer to the subjects paper source binder for the informed consent documentation checklist and the completed PRO questionnaires.  Signed by  Bing, [place] Coordinator  [facility]  [place], [place] 12:11 PM 07/26/2016

## 2016-07-21 NOTE — Addendum Note (Signed)
Addended by: Chase Picket A on: 07/21/2016 02:12 PM   Modules accepted: Orders

## 2016-07-21 NOTE — Assessment & Plan Note (Signed)
Clinically stable but need PFT to assess progression  Plan Continue ofev Check lft 07/21/2016 Do  Pre-bd spiro and dlco only. No lung volume or bd response - any location next few days to weeks At some point will refer to Roma Kayser for IPF genetics Appreciate you participating in PFF support group Appreciate participation in [facility] registry study Appreicate willingness for future research trials; will keep you posted Cotniue aerobic exercises but add weight training Discussed transplant briefly; can discuss more at followup  Followup  - repeat LFT in 1 month - repeat Pre-bd spiro and dlco only. No lung volume or bd response in 3 months - return to see me in 3 months

## 2016-07-21 NOTE — Progress Notes (Signed)
Subjective:     Patient ID: Shaun [name], male   DOB: 29-Nov-1955, 61 y.o.   MRN: [id number]  HPI   OV 02/22/2016  Chief Complaint  Patient presents with  . Pulmonary Consult    Pt referred by Dr.Byrum for ILD. Pt states he feels he is still recovering from the lung biopsy. Pt denies cough and CP/tightness.     2nd opinionand transfer of care for ILD/UIP - IPF from DR Byrum. Presents with his wife. They related to Dr. Stanford Breed cardiologist   61 year old male found to have crackles on physical exam and subsequently surgical lung biopsy 02/02/2016 showed UIP as read by pathologist in [place].  [employer] of chest physicians interstitial lung disease questionnaire  Symptoms: He does not cough and is not troubled by shortness of breath except with strenuous exercise. Onset is only "recently" Past medical history: Positive for chronic sinus drainage mild. Recently after surgical lung biopsy was being a few bone unintentional weight loss. Otherwise negative Personal exposure history: He smoked occasional cigarettes having grown up on her tobacco farm starting at 57 including when he was age 89.  Family history: All the male members in his family who smoke developed COPD but also did not smoke to the pulmonary fibrosis especially his father and his paternal uncles. His dad was diagnosed with pulmonary fibrosis at age of 54 in June 05, 2001 and died from progressive hypoxemic respiratory failure after what sounds like an IPF flare in 05-Jun-2005.  Home exposure history: He is lived in the same house since 1987-06-06. The home does not have a humidifier or sound or hot tub or Jacuzzi. He is not exposed to any birds. 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 Approximately 30 years of work ending 4 years ago used to cut  through dry walls and was exposed to concrete dust and drywall dust and possibly asbestos. HE is to cut through insulation safe. 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 Did not work in a minor quarry a Teacher, music on Agricultural engineer. Denies any exposure to birds of feathers  Autoimmune history: Is negative.  History of exposure to pulmonary toxic drugs: Negative   Lab work - April 2017 and November 2017 extensive autoimmune and vascular this panel is negative  - High resolution CT scan of the chest August 2017: UIP pattern that is classic - personally visualized. There is also 5 mm right lower lobe lung nodule and a 5.9 cm left-sided lower bulla [stable compared to April 2017]   Severity - Pulmonary function test 08/02/2015: Isolated reduction in diffusion capacity 17.33/61%. Otherwise FVC 4 L/93% and normal. Total lung capacity 5.9 L/91% and normal - Walking desaturation test 185 feet 3 laps on room air in the office 02/22/2016: Did not desaturate but gets symptomatic   Emotional state: He is extremely worried about the diagnosis of IPF. This is particularly so because he has a strong family history and his father died within 69 years usually was diagnosed much order at age 47. Patient is worried that he got diagnosed with the same problem and much younger age of 77 and feels that he only will have a few years to live. Multiple times during  the visit he was teary-eyed   OV 07/21/2016  Chief Complaint  Patient presents with  . Follow-up    Pt denies change in breathing since last OV. Pt denies cough and SOB and CP/tightness.      Follow-up idiopathic pulmonary fibrosis familial variety  He presents with his wife. Overall he is stable. 4 routine activities of daily living and mowing the yard he does not have any shortness of breath or cough. He did find the mountains recently been quite dyspneic.  This is worse than June 2017 with for the diagnosis of IPF. Overall he is tolerating the Ofev just fine except occasionally he has some GI discomfort.He is questions about research trials, exercise, pulmonary fibrosis foundation and family genetics.he is quite active and diffuse pulmonary rehabilitation is easy for him. He is willing to go to [facility] and work on aerobic strengthening and muscle conditioning  Notice that she's not had pulmonary function testing since May 2017    has a past medical history of Dyspnea; Hyperlipemia; and Pneumonia.   reports that he has never smoked. He has never used smokeless tobacco.  Past Surgical History:  Procedure Laterality Date  . COLONOSCOPY WITH PROPOFOL N/A 09/21/2014   Procedure: COLONOSCOPY WITH PROPOFOL;  Surgeon: Garlan Fair, MD;  Location: [facility];  Service: Endoscopy;  Laterality: N/A;  . FOREARM SURGERY     excision of birth mark"child"  . HERNIA REPAIR     RIH-open  . KNEE ARTHROTOMY Left    bursa excision  . LUNG BIOPSY Right 02/02/2016   Procedure: LUNG BIOPSY;  Surgeon: [name], MD;  Location: [place];  Service: Thoracic;  Laterality: Right;  . NASAL SEPTUM SURGERY    . VIDEO ASSISTED THORACOSCOPY Right 02/02/2016   Procedure: VIDEO ASSISTED THORACOSCOPY;  Surgeon: [name], MD;  Location: [place];  Service: Thoracic;  Laterality: Right;  . WRIST GANGLION EXCISION Left     Allergies  Allergen Reactions  . Penicillins Other (See Comments)    UNSPECIFIED REACTION OF CHILDHOOD Has patient had a PCN reaction causing immediate rash, facial/tongue/throat swelling, SOB or lightheadedness with hypotension:unsure Has patient had a PCN reaction causing severe rash involving mucus membranes or skin necrosis:unsure Has patient had a PCN reaction that required hospitalization:No Has patient had a PCN reaction occurring within the last 10 years:No If all of the above answers are "NO", then may proceed with  Cephalosporin use.     Immunization History  Administered Date(s) Administered  . Influenza-Unspecified 11/02/2015  . Pneumococcal Polysaccharide-23 02/05/2016  . Zoster 11/02/2015    Family History  Problem Relation Age of Onset  . Lung cancer Mother   . Colon cancer Mother   . Pulmonary fibrosis Father      Current Outpatient Prescriptions:  .  albuterol (PROVENTIL HFA;VENTOLIN HFA) 108 (90 Base) MCG/ACT inhaler, Inhale 2 puffs into the lungs every 6 (six) hours as needed for wheezing or shortness of breath., Disp: 1 Inhaler, Rfl: 5 .  aspirin EC 81 MG tablet, Take 81 mg by mouth daily., Disp: , Rfl:  .  Chlorpheniramine-Pseudoeph 4-60 MG TABS, Take 1 tablet by mouth 2 (two) times daily as needed (for allergies.)., Disp: , Rfl:  .  Multiple Vitamin (MULTIVITAMIN WITH MINERALS) TABS tablet, Take 1 tablet by mouth daily., Disp: , Rfl:  .  OFEV 150 MG CAPS, 150 mg. , Disp: , Rfl: 10 .  pantoprazole (PROTONIX) 40 MG tablet, take 1 tablet by mouth once daily, Disp: 30 tablet,  Rfl: 3 .  simvastatin (ZOCOR) 10 MG tablet, Take 10 mg by mouth daily at 6 PM. , Disp: , Rfl: 0   Review of Systems     Objective:   Physical Exam  Constitutional: He is oriented to person, place, and time. He appears well-developed and well-nourished. No distress.  HENT:  Head: Normocephalic and atraumatic.  Right Ear: External ear normal.  Left Ear: External ear normal.  Mouth/Throat: Oropharynx is clear and moist. No oropharyngeal exudate.  Eyes: Conjunctivae and EOM are normal. Pupils are equal, round, and reactive to light. Right eye exhibits no discharge. Left eye exhibits no discharge. No scleral icterus.  Neck: Normal range of motion. Neck supple. No JVD present. No tracheal deviation present. No thyromegaly present.  Cardiovascular: Normal rate, regular rhythm and intact distal pulses.  Exam reveals no gallop and no friction rub.   No murmur heard. Pulmonary/Chest: Effort normal. No respiratory  distress. He has no wheezes. He has rales. He exhibits no tenderness.  Abdominal: Soft. Bowel sounds are normal. He exhibits no distension and no mass. There is no tenderness. There is no rebound and no guarding.  Musculoskeletal: Normal range of motion. He exhibits no edema or tenderness.  Lymphadenopathy:    He has no cervical adenopathy.  Neurological: He is alert and oriented to person, place, and time. He has normal reflexes. No cranial nerve deficit. Coordination normal.  Skin: Skin is warm and dry. No rash noted. He is not diaphoretic. No erythema. No pallor.  Psychiatric: He has a normal mood and affect. His behavior is normal. Judgment and thought content normal.  Nursing note and vitals reviewed.   Vitals:   07/21/16 1333  BP: 122/78  Pulse: 83  SpO2: 97%  Weight: 146 lb 9.6 oz (66.5 kg)  Height: _0  (1.702 m)    Estimated body mass index is 22.96 kg/m as calculated from the following:   Height as of this encounter: _1  (1.702 m).   Weight as of this encounter: 146 lb 9.6 oz (66.5 kg).      Assessment:       ICD-9-CM ICD-10-CM   1. Familial idiopathic pulmonary fibrosis (HCC) 516.31 J84.112        Plan:     Familial idiopathic pulmonary fibrosis ([place]) Clinically stable but need PFT to assess progression  Plan Continue ofev Check lft 07/21/2016 Do  Pre-bd spiro and dlco only. No lung volume or bd response - any location next few days to weeks At some point will refer to Roma Kayser for IPF genetics Appreciate you participating in PFF support group Appreciate participation in [facility] registry study Appreicate willingness for future research trials; will keep you posted Cotniue aerobic exercises but add weight training Discussed transplant briefly; can discuss more at followup  Followup  - repeat LFT in 1 month - repeat Pre-bd spiro and dlco only. No lung volume or bd response in 3 months - return to see me in 3 months     > 50% of this > 25 min visit  spent in face to face counseling or coordination of care   Dr. Brand Males, M.D., [facility].C.P Pulmonary and Critical Care Medicine Staff Physician [place] [facility] Pager: [phone], If no answer or between  15:00h - 7:00h: call [phone]  07/21/2016 1:56 PM

## 2016-07-24 ENCOUNTER — Telehealth: Payer: Self-pay | Admitting: Internal Medicine

## 2016-07-24 NOTE — Telephone Encounter (Signed)
Notes recorded by Brand Males, MD on 07/21/2016 at 3:35 PM EDT   Lab       07/21/16           1417     AST     20      ALT     12      ALKPHOS   72      BILITOT   0.4      PROT     7.0      ALBUMIN   4.3       lft normal ------------------------- Spoke with pt, aware of results/recs.  Nothing further needed.

## 2016-08-21 ENCOUNTER — Other Ambulatory Visit (INDEPENDENT_AMBULATORY_CARE_PROVIDER_SITE_OTHER): Payer: BLUE CROSS/BLUE SHIELD

## 2016-08-21 DIAGNOSIS — J84112 Idiopathic pulmonary fibrosis: Secondary | ICD-10-CM | POA: Diagnosis not present

## 2016-08-21 LAB — HEPATIC FUNCTION PANEL
ALK PHOS: 61 U/L (ref 39–117)
ALT: 13 U/L (ref 0–53)
AST: 21 U/L (ref 0–37)
Albumin: 4.3 g/dL (ref 3.5–5.2)
BILIRUBIN DIRECT: 0.2 mg/dL (ref 0.0–0.3)
BILIRUBIN TOTAL: 0.7 mg/dL (ref 0.2–1.2)
Total Protein: 6.9 g/dL (ref 6.0–8.3)

## 2016-08-25 NOTE — Progress Notes (Signed)
LMTCB

## 2016-08-29 ENCOUNTER — Telehealth: Payer: Self-pay | Admitting: Internal Medicine

## 2016-08-29 NOTE — Telephone Encounter (Signed)
Pt returning call.Shaun [name] ° °

## 2016-08-29 NOTE — Telephone Encounter (Signed)
lmomtcb x 1 for the pt 

## 2016-08-29 NOTE — Telephone Encounter (Signed)
Notes recorded by Brand Males, MD on 08/24/2016 at 5:04 PM EDT LFT normal. Ensure he has fu since last ov  Spoke with patient regarding lab results. Patient verbalized understanding.

## 2016-09-01 NOTE — Progress Notes (Signed)
Called and spoke to pt. Informed him of the results per MR. Pt verbalized understanding and denied any further questions or concerns at this time.

## 2016-10-17 ENCOUNTER — Telehealth: Payer: Self-pay | Admitting: Internal Medicine

## 2016-10-17 NOTE — Telephone Encounter (Signed)
Spoke wtith [name] who verified that the patient does not have to prep for his HRCT, just he needs to arrive 15 mins early. Spoke with pt and made him aware of this. He had no additional questions at this time. Nothing further is needed

## 2016-10-18 ENCOUNTER — Other Ambulatory Visit: Payer: Self-pay | Admitting: Internal Medicine

## 2016-10-20 ENCOUNTER — Telehealth: Payer: Self-pay | Admitting: Adult Health

## 2016-10-20 NOTE — Telephone Encounter (Signed)
Pt is requesting that his CT scan be scheduled at [facility] due to it being more cost efficient for him.  PCC's can you guys help with this?  Do we need to place a new order?  Thanks

## 2016-10-20 NOTE — Telephone Encounter (Signed)
Patient states he can be reached on home phone as well: [phone].Marland Kitchenert

## 2016-10-20 NOTE — Telephone Encounter (Signed)
I have rescheduled CT to [place].  I have spoken to pt & gave him new appt info.  Will give to [facility] to change location on precert.

## 2016-10-24 ENCOUNTER — Ambulatory Visit
Admission: RE | Admit: 2016-10-24 | Discharge: 2016-10-24 | Disposition: A | Discharge status: Home / Self Care | Payer: BLUE CROSS/BLUE SHIELD | Source: Ambulatory Visit | Attending: Adult Health | Admitting: Adult Health

## 2016-10-24 ENCOUNTER — Inpatient Hospital Stay: Admission: RE | Admit: 2016-10-24 | Payer: BLUE CROSS/BLUE SHIELD | Source: Ambulatory Visit

## 2016-10-24 DIAGNOSIS — J849 Interstitial pulmonary disease, unspecified: Secondary | ICD-10-CM | POA: Diagnosis not present

## 2016-10-25 ENCOUNTER — Encounter: Payer: Self-pay | Admitting: Internal Medicine

## 2016-10-25 ENCOUNTER — Ambulatory Visit (INDEPENDENT_AMBULATORY_CARE_PROVIDER_SITE_OTHER): Payer: BLUE CROSS/BLUE SHIELD | Admitting: Internal Medicine

## 2016-10-25 ENCOUNTER — Other Ambulatory Visit (INDEPENDENT_AMBULATORY_CARE_PROVIDER_SITE_OTHER): Payer: BLUE CROSS/BLUE SHIELD

## 2016-10-25 VITALS — BP 132/74 | HR 94 | Ht 67.0 in | Wt 147.0 lb

## 2016-10-25 DIAGNOSIS — J439 Emphysema, unspecified: Secondary | ICD-10-CM | POA: Diagnosis not present

## 2016-10-25 DIAGNOSIS — J84112 Idiopathic pulmonary fibrosis: Secondary | ICD-10-CM

## 2016-10-25 LAB — PULMONARY FUNCTION TEST
DL/VA % pred: 69 %
DL/VA: 3.09 ml/min/mmHg/L
DLCO COR % PRED: 58 %
DLCO UNC % PRED: 60 %
DLCO UNC: 17.25 ml/min/mmHg
DLCO cor: 16.47 ml/min/mmHg
FEF 25-75 Pre: 4.37 L/sec
FEF2575-%PRED-PRE: 166 %
FEV1-%Pred-Pre: 112 %
FEV1-Pre: 3.59 L
FEV1FVC-%Pred-Pre: 115 %
FEV6-%Pred-Pre: 103 %
FEV6-Pre: 4.15 L
FEV6FVC-%Pred-Pre: 105 %
FVC-%Pred-Pre: 98 %
FVC-Pre: 4.16 L
PRE FEV1/FVC RATIO: 86 %
Pre FEV6/FVC Ratio: 100 %

## 2016-10-25 LAB — HEPATIC FUNCTION PANEL
ALT: 13 U/L (ref 0–53)
AST: 25 U/L (ref 0–37)
Albumin: 4.1 g/dL (ref 3.5–5.2)
Alkaline Phosphatase: 50 U/L (ref 39–117)
BILIRUBIN DIRECT: 0.1 mg/dL (ref 0.0–0.3)
BILIRUBIN TOTAL: 0.6 mg/dL (ref 0.2–1.2)
TOTAL PROTEIN: 6.7 g/dL (ref 6.0–8.3)

## 2016-10-25 MED ORDER — TIOTROPIUM BROMIDE MONOHYDRATE 2.5 MCG/ACT IN AERS: 2.0000 | INHALATION_SPRAY | Freq: Every day | RESPIRATORY_TRACT | 11 refills | Status: DC

## 2016-10-25 NOTE — Progress Notes (Signed)
Subjective:     Patient ID: Shaun [name], male   DOB: 29-Nov-1955, 61 y.o.   MRN: [id number]  HPI   OV 02/22/2016  Chief Complaint  Patient presents with  . Pulmonary Consult    Pt referred by Dr.Byrum for ILD. Pt states he feels he is still recovering from the lung biopsy. Pt denies cough and CP/tightness.     2nd opinionand transfer of care for ILD/UIP - IPF from DR Byrum. Presents with his wife. They related to Dr. Stanford Breed cardiologist   61 year old male found to have crackles on physical exam and subsequently surgical lung biopsy 02/02/2016 showed UIP as read by pathologist in [place].  [employer] of chest physicians interstitial lung disease questionnaire  Symptoms: He does not cough and is not troubled by shortness of breath except with strenuous exercise. Onset is only "recently" Past medical history: Positive for chronic sinus drainage mild. Recently after surgical lung biopsy was being a few bone unintentional weight loss. Otherwise negative Personal exposure history: He smoked occasional cigarettes having grown up on her [place] farm starting at 57 including when he was age 89.  Family history: All the male members in his family who smoke developed COPD but also did not smoke to the pulmonary fibrosis especially his father and his paternal uncles. His dad was diagnosed with pulmonary fibrosis at age of 54 in June 05, 2001 and died from progressive hypoxemic respiratory failure after what sounds like an IPF flare in 05-Jun-2005.  Home exposure history: He is lived in the same house since 1987-06-06. The home does not have a humidifier or sound or hot tub or Jacuzzi. He is not exposed to any birds. 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 Approximately 30 years of work ending 4 years ago used to cut  through dry walls and was exposed to concrete dust and drywall dust and possibly asbestos. HE is to cut through insulation safe. 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 Did not work in a minor quarry a Teacher, music on Agricultural engineer. Denies any exposure to birds of feathers  Autoimmune history: Is negative.  History of exposure to pulmonary toxic drugs: Negative   Lab work - April 2017 and November 2017 extensive autoimmune and vascular this panel is negative  - High resolution CT scan of the chest August 2017: UIP pattern that is classic - personally visualized. There is also 5 mm right lower lobe lung nodule and a 5.9 cm left-sided lower bulla [stable compared to April 2017]   Severity - Pulmonary function test 08/02/2015: Isolated reduction in diffusion capacity 17.33/61%. Otherwise FVC 4 L/93% and normal. Total lung capacity 5.9 L/91% and normal - Walking desaturation test 185 feet 3 laps on room air in the office 02/22/2016: Did not desaturate but gets symptomatic   Emotional state: He is extremely worried about the diagnosis of IPF. This is particularly so because he has a strong family history and his father died within 69 years usually was diagnosed much order at age 47. Patient is worried that he got diagnosed with the same problem and much younger age of 77 and feels that he only will have a few years to live. Multiple times during  the visit he was teary-eyed   OV 07/21/2016  Chief Complaint  Patient presents with  . Follow-up    Pt denies change in breathing since last OV. Pt denies cough and SOB and CP/tightness.      Follow-up idiopathic pulmonary fibrosis familial variety  He presents with his wife. Overall he is stable. 4 routine activities of daily living and mowing the yard he does not have any shortness of breath or cough. He did find the mountains recently been quite dyspneic.  This is worse than June 2017 with for the diagnosis of IPF. Overall he is tolerating the Ofev just fine except occasionally he has some GI discomfort.He is questions about research trials, exercise, pulmonary fibrosis foundation and family genetics.he is quite active and diffuse pulmonary rehabilitation is easy for him. He is willing to go to [facility] and work on aerobic strengthening and muscle conditioning  Notice that she's not had pulmonary function testing since May 2017    has a past medical history of Dyspnea; Hyperlipemia; and Pneumonia.   reports that he has never smoked. He has never used smokeless tobacco.  OV 10/25/2016  Chief Complaint  Patient presents with  . Follow-up    Pt here after PFT. Pt states his breathing is unchanged. Pt c/o PND and dry cough. Pt denies f/c/s and CP/tightenss.    Follow-up mild idiopathic pulmonary fibrosis he's on Ofev since  NOv 2017  Here with his wife. Overall he is stable. His pulmonary function tests and CT scan of the chest shows continued stability for 1 year. Symptom-wise also he is stable. His dyspnea when doing mountains. He is kayaking and biking and is able to outpace his wife. During this time he does not check his pulse oximetry. He does not have one. I've advised that he can get one. He is asking about familial issues, research and also prognosis we went over all this. Of note CT scan of the chest shows also he has concomitant emphysema. He prefers to avoid medications but he is interested in taking an inhaler if it means improving his dyspnea. He is active with pulmonary fibrosis foundation patient support groupo. Of note he reports easy bruising in his forearm when he bumps into something. His wife thinks it's been there all along and is unchanged. He is wondering if that this is related to Ofev and asa. We did discuss about the bleeding risk of Ofev and the need to hold this before any procedure. Overall he tolerates Ofev just fine except  for occasional diarrhea once a month. This is associated with fiber food     Results for Shaun, [name] (MRN [id number]) as of 10/25/2016 09:54  Ref. Range 08/02/2015 10:41 10/25/2016 08:44  FVC-Pre Latest Units: L 4.00 4.16  FVC-%Pred-Pre Latest Units: % 93 98  Results for Shaun, [name] (MRN [id number]) as of 10/25/2016 09:54  Ref. Range 08/02/2015 10:41 10/25/2016 08:44  DLCO unc Latest Units: ml/min/mmHg 17.33 17.25  DLCO unc % pred Latest Units: % 61 60   Results for Shaun, [name] (MRN [id number]) as of 10/25/2016 09:54  Ref. Range 04/05/2016 10:15 07/21/2016 14:17 08/21/2016 08:42  AST Latest Ref Range: 0 - 37 U/L _0 ALT Latest Ref Range: 0 - 53 U/L _1 Ct Chest High Resolution  Result Date: 10/25/2016 CLINICAL DATA:  Interstitial lung disease, former smoker, cough. Lung biopsy November 2017. EXAM: CT CHEST WITHOUT CONTRAST TECHNIQUE: Multidetector CT imaging of the chest  was performed following the standard protocol without intravenous contrast. High resolution imaging of the lungs, as well as inspiratory and expiratory imaging, was performed. COMPARISON:  11/23/2015 and 07/08/2015. FINDINGS: Cardiovascular: Vascular structures are unremarkable. Heart size normal. No pericardial effusion. Mediastinum/Nodes: Mediastinal lymph nodes are not enlarged by CT size criteria. Hilar regions are difficult to evaluate without IV contrast. No axillary adenopathy. Esophagus is grossly unremarkable. Lungs/Pleura: Centrilobular and paraseptal emphysema with a large bullous lesion in the left lower lobe. Post biopsy changes in the right upper, right middle and right lower lobes. Superimposed basilar predominant subpleural reticulation, ground-glass and traction bronchiectasis/bronchiolectasis, likely stable from baseline examination of 07/08/2015 when differences in slice collimation are considered. There may be scattered honeycombing. Mild subpleural nodularity along the right major fissure  is unchanged from 07/08/2015, indicative of subpleural lymph nodes. No pleural fluid. Airway is unremarkable. Upper Abdomen: Visualized portions of the liver and gallbladder are unremarkable. Mild nodular thickening of both adrenal glands. Visualized portion of the right kidney is unremarkable. 2.5 cm low-attenuation lesion in the left kidney is likely a cyst. Visualized portions of the spleen, pancreas, stomach and bowel are grossly unremarkable. No upper abdominal adenopathy. Musculoskeletal: No worrisome lytic or sclerotic lesions. Degenerative changes in the spine. IMPRESSION: 1. Pulmonary parenchymal pattern of fibrosis is likely stable from 07/08/2015 when differences in slice collimation are considered. Findings are in keeping with the pathologic diagnosis of usual interstitial pneumonitis. 2.  Emphysema (ICD10-J43.9). Electronically Signed   By: Lorin Picket M.D.   On: 10/25/2016 08:23      has a past medical history of Dyspnea; Hyperlipemia; and Pneumonia.   reports that he has never smoked. He has never used smokeless tobacco.  Past Surgical History:  Procedure Laterality Date  . COLONOSCOPY WITH PROPOFOL N/A 09/21/2014   Procedure: COLONOSCOPY WITH PROPOFOL;  Surgeon: Garlan Fair, MD;  Location: [facility];  Service: Endoscopy;  Laterality: N/A;  . FOREARM SURGERY     excision of birth mark"child"  . HERNIA REPAIR     RIH-open  . KNEE ARTHROTOMY Left    bursa excision  . LUNG BIOPSY Right 02/02/2016   Procedure: LUNG BIOPSY;  Surgeon: [name], MD;  Location: [facility];  Service: Thoracic;  Laterality: Right;  . NASAL SEPTUM SURGERY    . VIDEO ASSISTED THORACOSCOPY Right 02/02/2016   Procedure: VIDEO ASSISTED THORACOSCOPY;  Surgeon: [name], MD;  Location: [facility];  Service: Thoracic;  Laterality: Right;  . WRIST GANGLION EXCISION Left     Allergies  Allergen Reactions  . Penicillins Other (See Comments)    UNSPECIFIED REACTION OF CHILDHOOD Has  patient had a PCN reaction causing immediate rash, facial/tongue/throat swelling, SOB or lightheadedness with hypotension:unsure Has patient had a PCN reaction causing severe rash involving mucus membranes or skin necrosis:unsure Has patient had a PCN reaction that required hospitalization:No Has patient had a PCN reaction occurring within the last 10 years:No If all of the above answers are "NO", then may proceed with Cephalosporin use.     Immunization History  Administered Date(s) Administered  . Influenza-Unspecified 11/02/2015  . Pneumococcal Polysaccharide-23 02/05/2016  . Zoster 11/02/2015    Family History  Problem Relation Age of Onset  . Lung cancer Mother   . Colon cancer Mother   . Pulmonary fibrosis Father      Current Outpatient Prescriptions:  .  albuterol (PROVENTIL HFA;VENTOLIN HFA) 108 (90 Base) MCG/ACT inhaler, Inhale 2 puffs into the lungs every 6 (six) hours as needed  for wheezing or shortness of breath., Disp: 1 Inhaler, Rfl: 5 .  aspirin EC 81 MG tablet, Take 81 mg by mouth daily., Disp: , Rfl:  .  Chlorpheniramine-Pseudoeph 4-60 MG TABS, Take 1 tablet by mouth 2 (two) times daily as needed (for allergies.)., Disp: , Rfl:  .  Multiple Vitamin (MULTIVITAMIN WITH MINERALS) TABS tablet, Take 1 tablet by mouth daily., Disp: , Rfl:  .  OFEV 150 MG CAPS, 150 mg. , Disp: , Rfl: 10 .  pantoprazole (PROTONIX) 40 MG tablet, take 1 tablet by mouth once daily, Disp: 30 tablet, Rfl: 5 .  simvastatin (ZOCOR) 10 MG tablet, Take 10 mg by mouth daily at 6 PM. , Disp: , Rfl: 0    Review of Systems     Objective:   Physical Exam  Constitutional: He is oriented to person, place, and time. He appears well-developed and well-nourished. No distress.  HENT:  Head: Normocephalic and atraumatic.  Right Ear: External ear normal.  Left Ear: External ear normal.  Mouth/Throat: Oropharynx is clear and moist. No oropharyngeal exudate.  Eyes: Pupils are equal, round, and reactive  to light. Conjunctivae and EOM are normal. Right eye exhibits no discharge. Left eye exhibits no discharge. No scleral icterus.  Neck: Normal range of motion. Neck supple. No JVD present. No tracheal deviation present. No thyromegaly present.  Cardiovascular: Normal rate, regular rhythm and intact distal pulses.  Exam reveals no gallop and no friction rub.   No murmur heard. Pulmonary/Chest: Effort normal. No respiratory distress. He has no wheezes. He has rales. He exhibits no tenderness.  Abdominal: Soft. Bowel sounds are normal. He exhibits no distension and no mass. There is no tenderness. There is no rebound and no guarding.  Musculoskeletal: Normal range of motion. He exhibits no edema or tenderness.  Lymphadenopathy:    He has no cervical adenopathy.  Neurological: He is alert and oriented to person, place, and time. He has normal reflexes. No cranial nerve deficit. Coordination normal.  Skin: Skin is warm and dry. No rash noted. He is not diaphoretic. No erythema. No pallor.  Psychiatric: He has a normal mood and affect. His behavior is normal. Judgment and thought content normal.  Nursing note and vitals reviewed.  Vitals:   10/25/16 0936  BP: 132/74  Pulse: 94  SpO2: 97%  Weight: 147 lb (66.7 kg)  Height: _0  (1.702 m)    Estimated body mass index is 23.02 kg/m as calculated from the following:   Height as of this encounter: _1  (1.702 m).   Weight as of this encounter: 147 lb (66.7 kg).     Assessment:       ICD-10-CM   1. IPF (idiopathic pulmonary fibrosis) ([place]) J84.112   2. Familial idiopathic pulmonary fibrosis ([place]) J84.112   3. Pulmonary emphysema, unspecified emphysema type ([place]) J43.9        Plan:      STable lugn disease with associated IPF and emphysema  - stable based on CT and PFT in 1 year  PLAN Continue ofev Check LFT Appreicate interest in PFF center and future trial participation  - you can check out [email or web address] ([facility]  might become a site for this) Appreciate ongoing IPF registry participation Have emailed Ms Roma Kayser genetics counselor - she is happy to see you; will refer Continue PPI due to beneift  4 months do Pre-bd spiro and dlco only. No lung volume or bd response. No post-bd spiro  Start spiriva respimat daily to see  if this helps dyspnea  Flu shot in fall Please talk to PCP Mayra Neer, MD -  and ensure you get  shingrix vaccine   Followup - 4 months do Pre-bd spiro and dlco only. No lung volume or bd response. No post-bd spiro - return to see me in 4 months   > 50% of this > 25 min visit spent in face to face counseling or coordination of care    Dr. Brand Males, M.D., [facility].C.P Pulmonary and Critical Care Medicine Staff Physician [facility] Pager: [phone], If no answer or between  15:00h - 7:00h: call [phone]  10/25/2016 10:19 AM

## 2016-10-25 NOTE — Patient Instructions (Signed)
ICD-10-CM   1. IPF (idiopathic pulmonary fibrosis) ([place]) J84.112   2. Familial idiopathic pulmonary fibrosis ([place]) J84.112   3. Pulmonary emphysema, unspecified emphysema type ([place]) J43.9    STable lugn disease with associated IPF and emphysema  - stable based on CT and PFT in 1 year  PLAN Continue ofev Check LFT Appreicate interest in PFF center and future trial participation  - you can check out [email or web address] ([facility] might become a site for this) Appreciate ongoing IPF registry participation Have emailed Ms Roma Kayser genetics counselor - she is happy to see you; will refer  4 months do Pre-bd spiro and dlco only. No lung volume or bd response. No post-bd spiro  Start spiriva respimat daily to see if this helps dyspnea  Flu shot in fall Please talk to PCP Mayra Neer, MD -  and ensure you get  shingarix vaccine   Followup - 4 months do Pre-bd spiro and dlco only. No lung volume or bd response. No post-bd spiro - return to see me in 4 months

## 2016-10-25 NOTE — Progress Notes (Signed)
PFT done today. 

## 2016-10-25 NOTE — Addendum Note (Signed)
Addended by: Collier Salina on: 10/25/2016 10:30 AM   Modules accepted: Orders

## 2016-10-26 ENCOUNTER — Encounter: Payer: Self-pay | Admitting: Genetic Counselor

## 2016-10-26 ENCOUNTER — Telehealth: Payer: Self-pay | Admitting: Genetic Counselor

## 2016-10-26 NOTE — Telephone Encounter (Signed)
LM on VM that I was calling to address questions he had about genetic counseling.  Left CB number.

## 2016-10-27 NOTE — Progress Notes (Signed)
Called and spoke to pt. Informed him of the results per MR. Pt verbalized understanding and denied any further questions or concerns at this time.  

## 2016-11-02 ENCOUNTER — Telehealth: Payer: Self-pay | Admitting: Genetic Counselor

## 2016-11-02 NOTE — Telephone Encounter (Signed)
LM on VM that I had BI information back.  Asked that he please call me to go over.

## 2016-11-15 ENCOUNTER — Ambulatory Visit (HOSPITAL_BASED_OUTPATIENT_CLINIC_OR_DEPARTMENT_OTHER): Payer: BLUE CROSS/BLUE SHIELD | Admitting: Genetic Counselor

## 2016-11-15 ENCOUNTER — Other Ambulatory Visit: Payer: BLUE CROSS/BLUE SHIELD

## 2016-11-15 ENCOUNTER — Encounter: Payer: Self-pay | Admitting: Genetic Counselor

## 2016-11-15 DIAGNOSIS — J84112 Idiopathic pulmonary fibrosis: Secondary | ICD-10-CM

## 2016-11-15 DIAGNOSIS — Z836 Family history of other diseases of the respiratory system: Secondary | ICD-10-CM

## 2016-11-15 DIAGNOSIS — Z8042 Family history of malignant neoplasm of prostate: Secondary | ICD-10-CM

## 2016-11-15 DIAGNOSIS — Z800 Family history of malignant neoplasm of digestive organs: Secondary | ICD-10-CM

## 2016-11-15 NOTE — Progress Notes (Signed)
REFERRING PROVIDER: Brand Males, MD [place], [place] [address]  PRIMARY PROVIDER:  Mayra Neer, MD  PRIMARY REASON FOR VISIT:  1. IPF (idiopathic pulmonary fibrosis) ([name])   2. Family history of colon cancer   3. Family history of prostate cancer   4. Family history of pulmonary fibrosis   5. Familial idiopathic pulmonary fibrosis (HCC)      HISTORY OF PRESENT ILLNESS:   Shaun [name], a 60 y.o. male, was seen for a Shaun [name] cancer genetics consultation at the request of Dr. Chase [name] due to a personal and family history of pulmonary fibrosis and a family history of cancer.  Shaun [name] [name] to clinic today to discuss the possibility of a hereditary predisposition to pulmonary fibrosis and cancer, genetic testing, and to further clarify his future cancer risks, as well as potential risks for family members. Shaun [name] is a 61 y.o. male with no personal history of cancer.  He was seen by his PCP who heard crackles during his annual physical.  He underwent a chest xray/CT and several tests which determined that he has Pulmonary Fibrosis.  CANCER HISTORY:   No history exists.     RISK FACTORS:  Colonoscopy: yes; 1 polyp. Any excessive radiation exposure in the past:  No Prostate Cancer Screening: Performed at annual physical Dermatology: No Pets: No birds, and no pets now.  Had [place] pigs in the past. 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  Wore dust mask.  Past Medical History:  Diagnosis Date  . Dyspnea   . Family history of colon cancer   . Family history of prostate cancer   . Family history of pulmonary fibrosis   . Hyperlipemia   . Pneumonia     Past Surgical History:  Procedure Laterality Date  . COLONOSCOPY WITH PROPOFOL N/A 09/21/2014   Procedure: COLONOSCOPY WITH PROPOFOL;  Surgeon: Garlan Fair, MD;  Location: [facility];  Service: Endoscopy;  Laterality: N/A;  . FOREARM SURGERY      excision of birth mark"child"  . HERNIA REPAIR     RIH-open  . KNEE ARTHROTOMY Left    bursa excision  . LUNG BIOPSY Right 02/02/2016   Procedure: LUNG BIOPSY;  Surgeon: [name], MD;  Location: [place];  Service: Thoracic;  Laterality: Right;  . NASAL SEPTUM SURGERY    . VIDEO ASSISTED THORACOSCOPY Right 02/02/2016   Procedure: VIDEO ASSISTED THORACOSCOPY;  Surgeon: [name], MD;  Location: [place];  Service: Thoracic;  Laterality: Right;  . WRIST GANGLION EXCISION Left     Social History   Social History  . Marital status: Married    Spouse name: N/A  . Number of children: N/A  . Years of education: N/A   Social History Main Topics  . Smoking status: Never Smoker  . Smokeless tobacco: Never Used  . Alcohol use No  . Drug use: No  . Sexual activity: Not Asked   Other Topics Concern  . 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      FAMILY HISTORY:  We obtained a detailed, 4-generation family history.  Significant diagnoses are listed below: Family History  Problem Relation Age of Onset  . Lung cancer Mother   . Colon cancer Mother 45  . Pulmonary fibrosis Father        dx in his 39s  . Prostate cancer Brother 53  . COPD Paternal  Grandfather   . Other Paternal Uncle        possible lung dx; died around age 89  . Other Cousin        possible lung dx, died in her 6s; paternal first cousin  . Healthy Son     The patient has one son who is healthy.  He has a brother and sister.  The brother was diagnosed with prostate cancer at age 64.    Both parents are deceased.  His mother was diagnosed with colon cancer at 36 and died at 54.  She had for sisters and six brothers.  The patient's is unaware of any cancer diagnoses in his aunts and uncles.  His grandparents are deceased for unknown causes.  The patient's father was diagnosed with pulmonary fibrosis at age 24.  He had eight siblings.  One brother had a lung  disease, and this brother's daughter died of a lung disease in her 50's-50's.  His paternal grandfather had been diagnosed with COPD and died at 77.  He is unsure whether he was a smoker.  Shaun [name] is unaware of previous family history of genetic testing for hereditary cancer risks. Patient's maternal ancestors are of Caucasian descent, and paternal ancestors are of English descent. There is no reported Ashkenazi Jewish ancestry. There is no known consanguinity.  GENETIC COUNSELING ASSESSMENT: Shaun [name] is a 61 y.o. male with a personal history of pulmonary fibrosis and family history of pulmonary fibrosis and colon and prostate cancer which is somewhat suggestive of a hereditary conditions and predisposition to cancer. We, therefore, discussed and recommended the following at today's visit.   DISCUSSION: Pulmonary fibrosis is a group of lung diseases characterized by development of scarring in the lungs.  There are several different subtypes of pulmonary fibrosis, including idiopathic pulmonary fibrosis (IPF), with different causes that are defined based on their appearance on imaging tests like a CT scan or how lung tissue looks under the microscope after a lung biopsy.  The incidence of pulmonary fibrosis in the general population is approximately 6-16/100,000 people in the [place].     Familial Pulmonary Fibrosis (FPF) is defined by having two or more first degree relatives (parent, sibling or child) in a family with a diagnosis of pulmonary fibrosis.  In FPF, there is evidence that an underlying inherited genetic component may contribute to the development of pulmonary fibrosis, but environmental factors may also contribute. At present, several genes are known to be associated with FPF and account for approximately 20-25% of FPF cases.  Thus, additional genes remain to be discovered.     Approximately 20% of patients with FPF have a change (or mutation) in one of their telomerase genes,  including TERT, TERC, RTEL1, and PARN.  Individuals with mutations in these genes may also be at risk for other health conditions such as low blood counts, early gray hair, or liver disease.  Rarely, FPF is due to mutations in other genes such as SFTPC or SFTPA2.   The inheritance pattern in FPF is unclear and may vary from family to family, but autosomal dominance with reduced penetrance appears most likely.  This means that it only takes one copy of the genetic factor inherited from one parent to have risk for the condition and there is a 50/50 chance of inheriting that genetic factor.  However, reduced penetrance means that not all individuals who inherit the genetic factor will develop symptoms or the disease.    We also discussed that about  5-6% of colon cancer is hereditary, with most cases due to Lynch syndrome.  We discussed that his mother comes from a large family with nobody else noted to have colon cancer, but since she was diagnosed at age 28 she should be eligible for testing.  We reviewed the characteristics, features and inheritance patterns of hereditary cancer syndromes. We also discussed genetic testing, including the appropriate family members to test, the process of testing, insurance coverage and turn-around-time for results. We discussed the implications of a negative, positive and/or variant of uncertain significant result. We recommended Shaun [name] pursue genetic testing for the Colorectal cancer gene panel and the Pulmonary Fibrosis gene panel.  The Colorectal Cancer Panel offered by [facility] includes sequencing and/or duplication/deletion testing of the following 20 genes: APC, ATM, AXIN2, BMPR1A, CDH1, CHEK2, EPCAM, MLH1, MSH2, MSH6, MUTYH, NTHL1, PMS2, POLD1, POLE, PTEN, SCG5/GREM1, SMAD4, STK11, and TP53. The Pulmonary Fibrosis Slice gene panel offered by [facility] includes sequencing and/or duplication/deletion testing of the following 10 genes: ABCA3, DKC1, NAF1, PARN, RTEL1, SFTPA2,  SFTPC, TERC, TERT, and TINF2.  Based on Shaun [name] personal and family history of pulmonary fibrosis and family history of cancer, he meets medical criteria for genetic testing. Despite that he meets criteria, he may still have an out of pocket cost. We discussed that if his out of pocket cost for testing is over $100, the laboratory will call and confirm whether he wants to proceed with testing.  If the out of pocket cost of testing is less than $100 he will be billed by the genetic testing laboratory.   PLAN: After considering the risks, benefits, and limitations, Shaun [name]  provided informed consent to pursue genetic testing and the blood sample was sent to [facility] for analysis of the Colorectal cancer panel the Pulmonary Fibrosis Slice test number 149. Results should be available within approximately 2-3 weeks' time for the colorectal cancer panel and approximately 8 weeks time for the Xome slice, at which point they will be disclosed by telephone to Shaun [name], as will any additional recommendations warranted by these results. Shaun [name] will receive a summary of his genetic counseling visit and a copy of his results once available. This information will also be available in Epic. We encouraged Shaun [name] to remain in contact with cancer genetics annually so that we can continuously update the family history and inform him of any changes in cancer genetics and testing that may be of benefit for his family. Shaun [name] questions were answered to his satisfaction today. Our contact information was provided should additional questions or concerns arise.  Lastly, we encouraged Shaun [name] to remain in contact with cancer genetics annually so that we can continuously update the family history and inform him of any changes in cancer genetics and testing that may be of benefit for this family.   Mr.  [name] questions were answered to his satisfaction today. Our contact information was  provided should additional questions or concerns arise. Thank you for the referral and allowing [place] to share in the care of your patient.   Kaydance Bowie P. Florene Glen, [place], [facility] Certified Genetic Counselor [email or web address] phone: [phone]  The patient was seen for a total of 60 minutes in face-to-face genetic counseling.  This patient was discussed with Drs. Magrinat, Lindi Adie and/or Burr Medico who agrees with the above.    _______________________________________________________________________ For Office Staff:  Number of people involved in session: 1 Was an Intern/ student involved with case: no

## 2016-11-17 DIAGNOSIS — Z800 Family history of malignant neoplasm of digestive organs: Secondary | ICD-10-CM | POA: Diagnosis not present

## 2016-11-17 DIAGNOSIS — J84112 Idiopathic pulmonary fibrosis: Secondary | ICD-10-CM | POA: Diagnosis not present

## 2016-11-17 DIAGNOSIS — Z86010 Personal history of colonic polyps: Secondary | ICD-10-CM | POA: Diagnosis not present

## 2016-11-17 DIAGNOSIS — Z8042 Family history of malignant neoplasm of prostate: Secondary | ICD-10-CM | POA: Diagnosis not present

## 2016-11-20 DIAGNOSIS — Z800 Family history of malignant neoplasm of digestive organs: Secondary | ICD-10-CM | POA: Diagnosis not present

## 2016-11-20 DIAGNOSIS — Z8042 Family history of malignant neoplasm of prostate: Secondary | ICD-10-CM | POA: Diagnosis not present

## 2016-11-20 DIAGNOSIS — Z86010 Personal history of colonic polyps: Secondary | ICD-10-CM | POA: Diagnosis not present

## 2016-11-20 DIAGNOSIS — J84112 Idiopathic pulmonary fibrosis: Secondary | ICD-10-CM | POA: Diagnosis not present

## 2016-11-28 ENCOUNTER — Encounter: Payer: Self-pay | Admitting: Genetic Counselor

## 2016-11-28 ENCOUNTER — Telehealth: Payer: Self-pay | Admitting: Genetic Counselor

## 2016-11-28 DIAGNOSIS — Z1379 Encounter for other screening for genetic and chromosomal anomalies: Secondary | ICD-10-CM | POA: Insufficient documentation

## 2016-11-28 NOTE — Telephone Encounter (Signed)
LM on VM with good news on the colon cancer panel.  Asked that he CB.

## 2016-11-29 ENCOUNTER — Ambulatory Visit: Payer: Self-pay | Admitting: Genetic Counselor

## 2016-11-29 DIAGNOSIS — Z1379 Encounter for other screening for genetic and chromosomal anomalies: Secondary | ICD-10-CM

## 2016-11-29 DIAGNOSIS — Z800 Family history of malignant neoplasm of digestive organs: Secondary | ICD-10-CM

## 2016-11-29 DIAGNOSIS — Z8042 Family history of malignant neoplasm of prostate: Secondary | ICD-10-CM

## 2016-11-29 NOTE — Telephone Encounter (Signed)
Revealed negative genetic testing on the CRC panel test.  Discussed that we do not know why his mother had colon cancer so young, but that he is not at a higher risk for getting colon cancer due to a genetic change in the genes we looked at.  We recommend colonoscopy every 5 years based on family history.  We are still waiting on the pulmonary fibrosis panel testing.  We will call when that comes back.

## 2016-11-29 NOTE — Progress Notes (Signed)
HPI: Mr. Yale was previously seen in the [facility] clinic due to a family history of cancer and concerns regarding a hereditary predisposition to cancer. Please refer to our prior cancer genetics clinic note for more information regarding Mr. Kendzierski's medical, social and family histories, and our assessment and recommendations, at the time. Mr. Deerman recent genetic test results were disclosed to him, as were recommendations warranted by these results. These results and recommendations are discussed in more detail below.  CANCER HISTORY:   No history exists.    FAMILY HISTORY:  We obtained a detailed, 4-generation family history.  Significant diagnoses are listed below: Family History  Problem Relation Age of Onset  . Lung cancer Mother   . Colon cancer Mother 68  . Pulmonary fibrosis Father        dx in his 8s  . Prostate cancer Brother 81  . COPD Paternal Grandfather   . Other Paternal Uncle        possible lung dx; died around age 14  . Other Cousin        possible lung dx, died in her 40s; paternal first cousin  . Healthy Son     The patient has one son who is healthy.  He has a brother and sister.  The brother was diagnosed with prostate cancer at age 41.    Both parents are deceased.  His mother was diagnosed with colon cancer at 71 and died at 2.  She had for sisters and six brothers.  The patient's is unaware of any cancer diagnoses in his aunts and uncles.  His grandparents are deceased for unknown causes.  The patient's father was diagnosed with pulmonary fibrosis at age 76.  He had eight siblings.  One brother had a lung disease, and this brother's daughter died of a lung disease in her 17's-50's.  His paternal grandfather had been diagnosed with COPD and died at 13.  He is unsure whether he was a smoker.  Mr. Barto is unaware of previous family history of genetic testing for hereditary cancer risks. Patient's maternal ancestors are of  Caucasian descent, and paternal ancestors are of English descent. There is no reported Ashkenazi Jewish ancestry. There is no known consanguinity.  GENETIC TEST RESULTS: Genetic testing reported out on November 29, 2016 through the Colorectal cancer panel found no deleterious mutations.  The Colorectal Cancer Panel offered by [facility] includes sequencing and/or duplication/deletion testing of the following 19 genes: APC, ATM, AXIN2, BMPR1A, CDH1, CHEK2, EPCAM, MLH1, MSH2, MSH6, MUTYH, PMS2, POLD1, POLE, PTEN, SCG5/GREM1, SMAD4, STK11, and TP53.   The test report has been scanned into EPIC and is located under the Molecular Pathology section of the Results Review tab.   Genetic testing for Familial Pulmonary Fibrosis is still pending.  We discussed with Mr. Kagawa that since the current genetic testing is not perfect, it is possible there may be a gene mutation in one of these genes that current testing cannot detect, but that chance is small. We also discussed, that it is possible that another gene that has not yet been discovered, or that we have not yet tested, is responsible for the cancer diagnoses in the family, and it is, therefore, important to remain in touch with cancer genetics in the future so that we can continue to offer Mr. Maffett the most up to date genetic testing.     CANCER SCREENING RECOMMENDATIONS: This normal result is reassuring and indicates that Mr. Muff does not likely have  an increased risk of cancer due to a mutation in one of these genes.  We, therefore, recommended  Mr. Armendariz continue to follow the cancer screening guidelines provided by his primary healthcare providers.   Based on his family history, Mr. Harrietta Guardian should be followed more closely with colonoscopy.  Typically, with a first degree relative affected with colon cancer he should be followed every 5 years.  He should have a discussion of his family history and genetic testing with his gastroenterologist to come  up with a screening plan with which they are both comfortable.  RECOMMENDATIONS FOR FAMILY MEMBERS: Individuals in this family might be at some increased risk of developing cancer, over the general population risk, simply due to the family history of cancer. We recommended women in this family have a yearly mammogram beginning at age 34, or 63 years younger than the earliest onset of cancer, an annual clinical breast exam, and perform monthly breast self-exams. Women in this family should also have a gynecological exam as recommended by their primary provider. All family members should have a colonoscopy by age 68, or 58 years younger than the earliest age of onset..  FOLLOW-UP: Lastly, we discussed with Mr. Pharo that cancer genetics is a rapidly advancing field and it is possible that new genetic tests will be appropriate for him and/or his family members in the future. We encouraged him to remain in contact with cancer genetics on an annual basis so we can update his personal and family histories and let him know of advances in cancer genetics that may benefit this family.   Our contact number was provided. Mr. Hausmann questions were answered to his satisfaction, and he knows he is welcome to call us at anytime with additional questions or concerns.   Roma Kayser, MS, [facility] Certified Genetic Counselor [email or web address]

## 2016-12-09 DIAGNOSIS — Z23 Encounter for immunization: Secondary | ICD-10-CM | POA: Diagnosis not present

## 2016-12-21 ENCOUNTER — Telehealth: Payer: Self-pay | Admitting: Genetic Counselor

## 2016-12-21 NOTE — Telephone Encounter (Signed)
error 

## 2017-01-05 DIAGNOSIS — R7301 Impaired fasting glucose: Secondary | ICD-10-CM | POA: Diagnosis not present

## 2017-01-05 DIAGNOSIS — E7800 Pure hypercholesterolemia, unspecified: Secondary | ICD-10-CM | POA: Diagnosis not present

## 2017-01-16 ENCOUNTER — Ambulatory Visit: Payer: Self-pay | Admitting: Genetic Counselor

## 2017-01-16 DIAGNOSIS — J84112 Idiopathic pulmonary fibrosis: Secondary | ICD-10-CM

## 2017-01-16 DIAGNOSIS — Z836 Family history of other diseases of the respiratory system: Secondary | ICD-10-CM

## 2017-01-16 DIAGNOSIS — Z1379 Encounter for other screening for genetic and chromosomal anomalies: Secondary | ICD-10-CM

## 2017-01-16 DIAGNOSIS — Z800 Family history of malignant neoplasm of digestive organs: Secondary | ICD-10-CM

## 2017-01-16 DIAGNOSIS — Z8042 Family history of malignant neoplasm of prostate: Secondary | ICD-10-CM

## 2017-01-16 NOTE — Progress Notes (Addendum)
GENETIC TEST RESULTS   Patient Name: Shaun [name] Patient Age: 61 y.o. Encounter Date: 01/16/2017  Referring Provider: Brand Males, MD    Shaun [name] was seen in the [facility] clinic on November 15, 2016 due to a personal and family history of pulmonary fibrosis, family history of cancer, and concern regarding a hereditary predisposition to both pulmonary fibrosis and cancer in the family. Please refer to the prior Genetics clinic note for more information regarding Shaun [name]'s medical and family histories and our assessment at the time.   FAMILY HISTORY:  We obtained a detailed, 4-generation family history.  Significant diagnoses are listed below: Family History  Problem Relation Age of Onset  . Lung cancer Mother   . Colon cancer Mother 32  . Pulmonary fibrosis Father        dx in his 62s  . Prostate cancer Brother 75  . COPD Paternal Grandfather   . Other Paternal Uncle        possible lung dx; died around age 35  . Other Cousin        possible lung dx, died in her 50s; paternal first cousin  . Healthy Son     The patient has one son who is healthy.  He has a brother and sister.  The brother was diagnosed with prostate cancer at age 34.    Both parents are deceased.  His mother was diagnosed with colon cancer at 40 and died at 9.  She had for sisters and six brothers.  The patient's is unaware of any cancer diagnoses in his aunts and uncles.  His grandparents are deceased for unknown causes.  The patient's father was diagnosed with pulmonary fibrosis at age 68.  He had eight siblings.  One brother had a lung disease, and this brother's daughter died of a lung disease in her 72's-50's.  His paternal grandfather had been diagnosed with COPD and died at 73.  He is unsure whether he was a smoker.  Shaun [name] is unaware of previous family history of genetic testing for hereditary cancer risks. Patient's maternal ancestors are of Caucasian descent, and paternal  ancestors are of English descent. There is no reported Ashkenazi Jewish ancestry. There is no known consanguinity.  GENETIC TESTING:  Testing, based on the family history of colon cancer, through the Colorectal Cancer Panel at [facility] was negative. At the time of Shaun [name] visit, we also  recommended he pursue genetic testing of the XomeDxSlice Pulmonary Fibrosis test. 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 There were no deleterious mutations in ABCA3, DKC1, NAF1, PARN, RTEL1, SFTPA2, TERC, TERT, and TINF2.    DISCUSSION: Pulmonary fibrosis is a group of lung diseases characterized by development of scarring in the lungs.  There are several different subtypes of pulmonary fibrosis, including idiopathic pulmonary fibrosis (IPF), with different causes that are defined based on their appearance on imaging tests like a CT scan or how lung tissue looks under the microscope after a lung biopsy.  The incidence of pulmonary fibrosis in the general population is approximately 6-16/100,000 people in the [place].     Familial Pulmonary Fibrosis (FPF) is defined by having two or more first degree relatives (parent, sibling or child) in a family with a diagnosis of pulmonary fibrosis.  In FPF, there is evidence that an underlying inherited genetic component may contribute to the development of pulmonary  fibrosis, but environmental factors may also contribute. At present, several genes are known to be associated with FPF and account for approximately 20-25% of FPF cases.  Thus, additional genes remain to be discovered.     Approximately 20% of patients with FPF have a change (or mutation) in one of their telomerase genes, including TERT, TERC, RTEL1, and PARN.  Individuals with mutations in these genes may also be at risk for other health conditions such as low blood  counts, early gray hair, or liver disease.  Rarely, FPF is due to mutations in other genes such as SFTPC or SFTPA2.   The inheritance pattern in FPF is unclear and may vary from family to family, but autosomal dominance with reduced penetrance appears most likely.  This means that it only takes one copy of the genetic factor inherited from one parent to have risk for the condition and there is a 50/50 chance of inheriting that genetic factor.  However, reduced penetrance means that not all individuals who inherit the genetic factor will develop symptoms or the disease.    SFTPC: Pathogenic, or disease causing, variants in the surfactant protein C (SFTPC) gene have been associated with both sporadic and familial interstitial lung disease (ILD).  Information about the clinical presentation of SFTPC mutations is emerging.  The age of presentation is variable with children presenting in infancy, others not until adulthood and some remaining symptom free.  Disease presentation can also vary with chronic pneumonitis of infancy and nonspecific ILD in children, to usual or desquamative interstitial pneumonia and idiopathic pulmonary fibrosis in adults.  The SFTPC c.218T>C (p,I73T) is the most common pathogenic variant reported in this gene.   FAMILIAL PULMONARY FIBROSIS MANAGEMENT RECOMMENDATIONS: At present, there are no evidence-based recommendations for at risk individuals in FPF families. Thus, the following recommendations are based on expert opinion and should be modified for each individual by his/her personal physician.     Pulmonary screening: -Asymptomatic at risk individuals in FPF families can consider baseline pulmonary function tests (breathing tests) as well as a visit to a pulmonologist beginning around age 88 -Individuals who develop breathing difficulty symptoms such as shortness of breath or persistent cough at any age should seek evaluation to determine the cause -The use of chest xray or CT  scan screening should be individualized -Intervals for screening can be determined based on findings on baseline evaluation and/or symptoms   Pulmonary disease prevention: -Avoid smoking and all smoke exposure -Avoid exposure to other known lung toxins such as asbestos and metal dusts   FAMILY MEMBERS: It is important that all of Mr. Loewen's relatives (both men and women) know of the presence of this gene mutation. Site-specific genetic testing can sort out who in the family is at risk and who is not.   Mr. Tesoro's children and siblings have a 50% chance to have inherited this mutation. We recommend they have genetic testing for this same mutation, as identifying the presence of this mutation would allow them to also take advantage of screening and risk-reducing measures.   SUPPORT AND RESOURCES: If Mr. Fernando is interested in IPF-specific information and support, there is a group called Pulmonary Fibrosis Foundation (http://www.pulmonaryfibrosis.org). To locate genetic counselors in other cities, visit the website of the [employer] (ArtistMovie.se) and Secretary/administrator for a Social worker by zip code.  We encouraged Mr. Spivak to remain in contact with [place] on an annual basis so we can update his personal and family histories, and let him know  of advances in pulmonary fibrosis genetics that may benefit the family. Our contact number was provided. Mr. Visser questions were answered to his satisfaction today, and he knows he is welcome to call anytime with additional questions.   Annabeth Tortora P. Florene Glen, [place], [facility] Certified Genetic Counselor [email or web address] phone: [phone]

## 2017-02-01 ENCOUNTER — Telehealth: Payer: Self-pay | Admitting: Internal Medicine

## 2017-02-01 NOTE — Telephone Encounter (Signed)
lmtcb X1 for pt  

## 2017-02-02 NOTE — Telephone Encounter (Signed)
LMTCB x2  

## 2017-02-05 NOTE — Telephone Encounter (Signed)
Called and spoke with pt and he stated that he has enough of the OFEV to last until the first 2 weeks of December, but he wanted to know should he wait to do the rx and the paper work when he comes in on 11/26 or should he go ahead and start the process now?  Will forward to MR and EP to follow up on.

## 2017-02-06 NOTE — Telephone Encounter (Signed)
Called pt left him a message stating that I was going to go ahead and fill out the [facility] paperwork and when he comes in for his visit with MR 11/26, we will have him sign the paperwork and then fax it for patient assistance. Nothing further needed.

## 2017-02-10 DIAGNOSIS — Z23 Encounter for immunization: Secondary | ICD-10-CM | POA: Diagnosis not present

## 2017-02-26 ENCOUNTER — Telehealth: Payer: Self-pay | Admitting: Internal Medicine

## 2017-02-26 ENCOUNTER — Encounter: Payer: Self-pay | Admitting: Internal Medicine

## 2017-02-26 ENCOUNTER — Ambulatory Visit: Payer: BLUE CROSS/BLUE SHIELD | Admitting: Internal Medicine

## 2017-02-26 ENCOUNTER — Ambulatory Visit (INDEPENDENT_AMBULATORY_CARE_PROVIDER_SITE_OTHER): Payer: BLUE CROSS/BLUE SHIELD | Admitting: Internal Medicine

## 2017-02-26 ENCOUNTER — Other Ambulatory Visit (INDEPENDENT_AMBULATORY_CARE_PROVIDER_SITE_OTHER): Payer: BLUE CROSS/BLUE SHIELD

## 2017-02-26 VITALS — BP 122/70 | HR 72 | Ht 67.0 in | Wt 146.4 lb

## 2017-02-26 DIAGNOSIS — J84112 Idiopathic pulmonary fibrosis: Secondary | ICD-10-CM

## 2017-02-26 LAB — HEPATIC FUNCTION PANEL
ALT: 12 U/L (ref 0–53)
AST: 22 U/L (ref 0–37)
Albumin: 4.4 g/dL (ref 3.5–5.2)
Alkaline Phosphatase: 62 U/L (ref 39–117)
BILIRUBIN DIRECT: 0.1 mg/dL (ref 0.0–0.3)
BILIRUBIN TOTAL: 0.6 mg/dL (ref 0.2–1.2)
Total Protein: 7.4 g/dL (ref 6.0–8.3)

## 2017-02-26 NOTE — Patient Instructions (Signed)
ICD-10-CM   1. IPF (idiopathic pulmonary fibrosis) (HCC) J84.112     IPF overall clinically stable  Mild decline in diffusion capacity could be an effort variation and could be an artifact given the fact you are feeling well  Glad you are tolerating the nintedanib quite well except for mild diarrhea  Plan - will followup with you on 03/17/17 PFF meeting and also thanks for considering higher engagement in PPFF local support group;  -Please report to basement pulmonix -We will check with genetics counselor about the status of insurance approval for your genetics testing -Continue nintedanib but check liver function test today -Please report to the research department pulmonic's in the basement for your IPF registry visit  - appreciate you be willing to Consider [place] IPF study; they will give you a copy oif consent when you go down at the basement  - In  3 months do Pre-bd spiro and dlco only. No lung volume or bd response. No post-bd spiro  Followup  - in 3 months but after spirometry

## 2017-02-26 NOTE — Progress Notes (Signed)
PFT done today. 

## 2017-02-26 NOTE — Progress Notes (Signed)
Subjective:     Patient ID: Shaun [name], male   DOB: 09/19/1955, 61 y.o.   MRN: [id number]  HPI     OV 02/22/2016  Chief Complaint  Patient presents with  . Pulmonary Consult    Pt referred by Dr.Byrum for ILD. Pt states he feels he is still recovering from the lung biopsy. Pt denies cough and CP/tightness.     2nd opinionand transfer of care for ILD/UIP - IPF from DR Byrum. Presents with his wife. They related to Dr. Stanford Breed cardiologist   61 year old male found to have crackles on physical exam and subsequently surgical lung biopsy 02/02/2016 showed UIP as read by pathologist in [place].  [employer] of chest physicians interstitial lung disease questionnaire  Symptoms: He does not cough and is not troubled by shortness of breath except with strenuous exercise. Onset is only "recently" Past medical history: Positive for chronic sinus drainage mild. Recently after surgical lung biopsy was being a few bone unintentional weight loss. Otherwise negative Personal exposure history: He smoked occasional cigarettes having grown up on her [place] farm starting at 11 including when he was age 61.  Family history: All the male members in his family who smoke developed COPD but also did not smoke to the pulmonary fibrosis especially his father and his paternal uncles. His dad was diagnosed with pulmonary fibrosis at age of 47 in 06/02/01 and died from progressive hypoxemic respiratory failure after what sounds like an IPF flare in Jun 02, 2005.  Home exposure history: He is lived in the same house since 1987-06-03. The home does not have a humidifier or sound or hot tub or Jacuzzi. He is not exposed to any birds. 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 Approximately 30 years of work ending 4 years ago used to  cut through dry walls and was exposed to concrete dust and drywall dust and possibly asbestos. HE is to cut through insulation safe. 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 Did not work in a minor quarry a Teacher, music on Agricultural engineer. Denies any exposure to birds of feathers  Autoimmune history: Is negative.  History of exposure to pulmonary toxic drugs: Negative   Lab work - April 2017 and November 2017 extensive autoimmune and vascular this panel is negative  - High resolution CT scan of the chest August 2017: UIP pattern that is classic - personally visualized. There is also 5 mm right lower lobe lung nodule and a 5.9 cm left-sided lower bulla [stable compared to April 2017]   Severity - Pulmonary function test 08/02/2015: Isolated reduction in diffusion capacity 17.33/61%. Otherwise FVC 4 L/93% and normal. Total lung capacity 5.9 L/91% and normal - Walking desaturation test 185 feet 3 laps on room air in the office 02/22/2016: Did not desaturate but gets symptomatic   Emotional state: He is extremely worried about the diagnosis of IPF. This is particularly so because he has a strong family history and his father died within 26 years usually was diagnosed much order at age 53. Patient is worried that he got diagnosed with the same problem and much younger age of 64 and feels that he only will have a few years to live. Multiple  times during the visit he was teary-eyed   OV 07/21/2016  Chief Complaint  Patient presents with  . Follow-up    Pt denies change in breathing since last OV. Pt denies cough and SOB and CP/tightness.      Follow-up idiopathic pulmonary fibrosis familial variety  He presents with his wife. Overall he is stable. 4 routine activities of daily living and mowing the yard he does not have any shortness of breath or cough. He did find the mountains recently been quite  dyspneic. This is worse than June 2017 with for the diagnosis of IPF. Overall he is tolerating the Ofev just fine except occasionally he has some GI discomfort.He is questions about research trials, exercise, pulmonary fibrosis foundation and family genetics.he is quite active and diffuse pulmonary rehabilitation is easy for him. He is willing to go to [facility] and work on aerobic strengthening and muscle conditioning  Notice that she's not had pulmonary function testing since May 2017    has a past medical history of Dyspnea; Hyperlipemia; and Pneumonia.   reports that he has never smoked. He has never used smokeless tobacco.  OV 10/25/2016  Chief Complaint  Patient presents with  . Follow-up    Pt here after PFT. Pt states his breathing is unchanged. Pt c/o PND and dry cough. Pt denies f/c/s and CP/tightenss.    Follow-up mild idiopathic pulmonary fibrosis he's on Ofev since  NOv 2017  Here with his wife. Overall he is stable. His pulmonary function tests and CT scan of the chest shows continued stability for 1 year. Symptom-wise also he is stable. His dyspnea when doing mountains. He is kayaking and biking and is able to outpace his wife. During this time he does not check his pulse oximetry. He does not have one. I've advised that he can get one. He is asking about familial issues, research and also prognosis we went over all this. Of note CT scan of the chest shows also he has concomitant emphysema. He prefers to avoid medications but he is interested in taking an inhaler if it means improving his dyspnea. He is active with pulmonary fibrosis foundation patient support groupo. Of note he reports easy bruising in his forearm when he bumps into something. His wife thinks it's been there all along and is unchanged. He is wondering if that this is related to Ofev and asa. We did discuss about the bleeding risk of Ofev and the need to hold this before any procedure. Overall he tolerates Ofev just  fine except for occasional diarrhea once a month. This is associated with fiber food    OV  02/26/2017  Chief Complaint  Patient presents with  . Follow-up    PFT done today. Pt still taking OFEV and doing good on it. Denies any complaints with cough, SOB, or CP.    Follow-up idiopathic pulmonary fibrosis on Nintendanib since November 2017  This time his wife is not here with him.  He has now been on nintedanib for a year.  Overall he is tolerating it fine except for mild occasional diarrhea.  He continues to be very active working out in the yard and doing daily walks with a few miles.  He notices only mild dyspnea with this relieved by rest.  He does not feel his health has declined in the last 1 year.  He does have concomitant emphysema on the CT scan but not apparent on the PFTs.  I asked him to try Spiriva last time  but it did not help.  He is dissatisfied with occasional albuterol use.  He did have pulmonary function test today which is documented below.  The FVC is stable but there is a change in the DLCO but he is not feeling it.  He is on IPF registry by [facility];  he is due for a study visit today.  He is interested in further studies especially [place].  We discussed this in detail brief.  He has had genetics testing he got the result.  He is worried insurance will deny this.  He is participating in the pulmonary fibrosis foundation patient support group.  He is asking details about the March 17, 2017 local [place]   Results for NICKOLAI, RINKS (MRN [id number]) as of 02/26/2017 09:37  Ref. Range 08/02/2015 10:41 10/25/2016 08:44 02/26/2017 08:52  FVC-Pre Latest Units: L 4.00 4.16 4.04  FVC-%Pred-Pre Latest Units: % 93 98 95   Results for KENYATTE, CHATMON (MRN [id number]) as of 02/26/2017 09:37  Ref. Range 08/02/2015 10:41 10/25/2016 08:44 02/26/2017 08:52  DLCO unc Latest Units: ml/min/mmHg 17.33 17.25 14.23  DLCO unc % pred Latest Units: % 61 60 50    Ct Chest High  Resolution  Result Date: 10/25/2016 CLINICAL DATA:  Interstitial lung disease, former smoker, cough. Lung biopsy November 2017. EXAM: CT CHEST WITHOUT CONTRAST TECHNIQUE: Multidetector CT imaging of the chest was performed following the standard protocol without intravenous contrast. High resolution imaging of the lungs, as well as inspiratory and expiratory imaging, was performed. COMPARISON:  11/23/2015 and 07/08/2015. FINDINGS: Cardiovascular: Vascular structures are unremarkable. Heart size normal. No pericardial effusion. Mediastinum/Nodes: Mediastinal lymph nodes are not enlarged by CT size criteria. Hilar regions are difficult to evaluate without IV contrast. No axillary adenopathy. Esophagus is grossly unremarkable. Lungs/Pleura: Centrilobular and paraseptal emphysema with a large bullous lesion in the left lower lobe. Post biopsy changes in the right upper, right middle and right lower lobes. Superimposed basilar predominant subpleural reticulation, ground-glass and traction bronchiectasis/bronchiolectasis, likely stable from baseline examination of 07/08/2015 when differences in slice collimation are considered. There may be scattered honeycombing. Mild subpleural nodularity along the right major fissure is unchanged from 07/08/2015, indicative of subpleural lymph nodes. No pleural fluid. Airway is unremarkable. Upper Abdomen: Visualized portions of the liver and gallbladder are unremarkable. Mild nodular thickening of both adrenal glands. Visualized portion of the right kidney is unremarkable. 2.5 cm low-attenuation lesion in the left kidney is likely a cyst. Visualized portions of the spleen, pancreas, stomach and bowel are grossly unremarkable. No upper abdominal adenopathy. Musculoskeletal: No worrisome lytic or sclerotic lesions. Degenerative changes in the spine. IMPRESSION: 1. Pulmonary parenchymal pattern of fibrosis is likely stable from 07/08/2015 when differences in slice collimation are  considered. Findings are in keeping with the pathologic diagnosis of usual interstitial pneumonitis. 2.  Emphysema (ICD10-J43.9). Electronically Signed   By: Lorin Picket M.D.   On: 10/25/2016 08:23     has a past medical history of Dyspnea, Family history of colon cancer, Family history of prostate cancer, Family history of pulmonary fibrosis, Hyperlipemia, and Pneumonia.   reports that  has never smoked. he has never used smokeless tobacco.  Past Surgical History:  Procedure Laterality Date  . COLONOSCOPY WITH PROPOFOL N/A 09/21/2014   Procedure: COLONOSCOPY WITH PROPOFOL;  Surgeon: Garlan Fair, MD;  Location: [facility];  Service: Endoscopy;  Laterality: N/A;  . FOREARM SURGERY     excision of birth mark"child"  . HERNIA REPAIR     RIH-open  .  KNEE ARTHROTOMY Left    bursa excision  . LUNG BIOPSY Right 02/02/2016   Procedure: LUNG BIOPSY;  Surgeon: [name], MD;  Location: [facility];  Service: Thoracic;  Laterality: Right;  . NASAL SEPTUM SURGERY    . VIDEO ASSISTED THORACOSCOPY Right 02/02/2016   Procedure: VIDEO ASSISTED THORACOSCOPY;  Surgeon: [name], MD;  Location: [facility];  Service: Thoracic;  Laterality: Right;  . WRIST GANGLION EXCISION Left     Allergies  Allergen Reactions  . Penicillins Other (See Comments)    UNSPECIFIED REACTION OF CHILDHOOD Has patient had a PCN reaction causing immediate rash, facial/tongue/throat swelling, SOB or lightheadedness with hypotension:unsure Has patient had a PCN reaction causing severe rash involving mucus membranes or skin necrosis:unsure Has patient had a PCN reaction that required hospitalization:No Has patient had a PCN reaction occurring within the last 10 years:No If all of the above answers are "NO", then may proceed with Cephalosporin use.     Immunization History  Administered Date(s) Administered  . Influenza-Unspecified 11/02/2015, 12/09/2016  . Pneumococcal Polysaccharide-23 02/05/2016  .  Zoster 11/02/2015  . Zoster Recombinat (Shingrix) 12/09/2016, 02/10/2017    Family History  Problem Relation Age of Onset  . Lung cancer Mother   . Colon cancer Mother 70  . Pulmonary fibrosis Father        dx in his 15s  . Prostate cancer Brother 26  . COPD Paternal Grandfather   . Other Paternal Uncle        possible lung dx; died around age 72  . Other Cousin        possible lung dx, died in her 74s; paternal first cousin  . Healthy Son      Current Outpatient Medications:  .  albuterol (PROVENTIL HFA;VENTOLIN HFA) 108 (90 Base) MCG/ACT inhaler, Inhale 2 puffs into the lungs every 6 (six) hours as needed for wheezing or shortness of breath., Disp: 1 Inhaler, Rfl: 5 .  aspirin EC 81 MG tablet, Take 81 mg by mouth daily., Disp: , Rfl:  .  Chlorpheniramine-Pseudoeph 4-60 MG TABS, Take 1 tablet by mouth 2 (two) times daily as needed (for allergies.)., Disp: , Rfl:  .  Multiple Vitamin (MULTIVITAMIN WITH MINERALS) TABS tablet, Take 1 tablet by mouth daily., Disp: , Rfl:  .  OFEV 150 MG CAPS, 150 mg. , Disp: , Rfl: 10 .  pantoprazole (PROTONIX) 40 MG tablet, take 1 tablet by mouth once daily, Disp: 30 tablet, Rfl: 5 .  simvastatin (ZOCOR) 10 MG tablet, Take 10 mg by mouth daily at 6 PM. , Disp: , Rfl: 0     Review of Systems     Objective:   Physical Exam  Constitutional: He is oriented to person, place, and time. He appears well-developed and well-nourished. No distress.  HENT:  Head: Normocephalic and atraumatic.  Right Ear: External ear normal.  Left Ear: External ear normal.  Mouth/Throat: Oropharynx is clear and moist. No oropharyngeal exudate.  Eyes: Conjunctivae and EOM are normal. Pupils are equal, round, and reactive to light. Right eye exhibits no discharge. Left eye exhibits no discharge. No scleral icterus.  Neck: Normal range of motion. Neck supple. No JVD present. No tracheal deviation present. No thyromegaly present.  Cardiovascular: Normal rate, regular  rhythm and intact distal pulses. Exam reveals no gallop and no friction rub.  No murmur heard. Pulmonary/Chest: Effort normal. No respiratory distress. He has no wheezes. He has rales. He exhibits no tenderness.  Crackles bottom 1/3rd  Abdominal:  Soft. Bowel sounds are normal. He exhibits no distension and no mass. There is no tenderness. There is no rebound and no guarding.  Musculoskeletal: Normal range of motion. He exhibits no edema or tenderness.  Lymphadenopathy:    He has no cervical adenopathy.  Neurological: He is alert and oriented to person, place, and time. He has normal reflexes. No cranial nerve deficit. Coordination normal.  Skin: Skin is warm and dry. No rash noted. He is not diaphoretic. No erythema. No pallor.  Psychiatric: He has a normal mood and affect. His behavior is normal. Judgment and thought content normal.  Nursing note and vitals reviewed.  Vitals:   02/26/17 0931  BP: 122/70  Pulse: 72  SpO2: 95%  Weight: 146 lb 6.4 oz (66.4 kg)  Height: _0  (1.702 m)       Assessment:       ICD-10-CM   1. IPF (idiopathic pulmonary fibrosis) (HCC) J84.112 Hepatic function panel       Plan:        IPF overall clinically stable  Mild decline in diffusion capacity could be an effort variation and could be an artifact given the fact you are feeling well  Glad you are tolerating the nintedanib quite well except for mild diarrhea  Plan - will followup with you on 03/17/17 PFF meeting and also thanks for considering higher engagement in PPFF local support group;  -Please report to basement pulmonix -We will check with genetics counselor about the status of insurance approval for your genetics testing -Continue nintedanib but check liver function test today -Please report to the research department pulmonic's in the basement for your IPF registry visit  - appreciate you be willing to Consider [place] IPF study; they will give you a copy oif consent when you go  down at the basement  - In  3 months do Pre-bd spiro and dlco only. No lung volume or bd response. No post-bd spiro  Followup  - in 3 months but after spirometry     > 50% of this > 25 min visit spent in face to face counseling or coordination of care    Dr. Brand Males, M.D., [facility].C.P Pulmonary and Critical Care Medicine Staff Physician [facility] Pager: [phone], If no answer or between  15:00h - 7:00h: call [phone]  02/26/2017 9:59 AM

## 2017-02-26 NOTE — Telephone Encounter (Signed)
Called [facility] stating that pt was seen at our office today and stated that he is on his last refill of his OFEV.  Gave verbal instructions to Valentino Saxon from the pharmacy who stated to me that she would take care of refilling pt's medication.  Nothing further needed.

## 2017-02-26 NOTE — Progress Notes (Signed)
IPF PRO Registry Purpose: To collect data and biological samples that will support future research studies.  Registry will describe current approaches to diagnosis and treatment of IPF, analyze participant characteristics to describe the natural history of the disease, assess quality of life, describe participants interactions with the health care system, describe IPF treatment practices across multiple institutions, and utilize biological samples linked to well characterized IPF participants to identify disease biomarkers.   Clinical Research Coordinator / Research RN note : This visit for [name] with DOB: [birth year] on 02/26/2017 for the above protocol is Visit/Encounter # 6 month f/u  and is for purpose of research . The consent for this encounter is under Protocol Version Amendment 2 and  is currently IRB approved. Subject expressed continued interest and consent in continuing as a study subject. Subject confirmed that there was  no change in contact information (e.g. address, telephone, email). Subject thanked for participation in research and contribution to science.   Subject completed questionnaires and laboratory assessments per the above mentioned protocol. Subject will return on 6 months for 1 year follow-up.   Signed by Roberts Bing, [place] Coordinator [facility]  [place], [place] 1:10 PM 02/26/2017

## 2017-03-06 ENCOUNTER — Telehealth: Payer: Self-pay | Admitting: Internal Medicine

## 2017-03-06 NOTE — Telephone Encounter (Signed)
Called [facility] stating that pt still has not received medication of OFEV 137m, which called and gave verbal on 02/26/17.  Spoke with LTruman Haywardwho states that he does see the verbal script which is in process and does not know why the Rx has not been carried over and stated that he will expedite the Rx so pt can receive it. [facility]'s [facility].  Nothing further needed at this time.

## 2017-03-06 NOTE — Telephone Encounter (Signed)
Called [facility] stating that pt still has not received medication of OFEV 171m, which called and gave verbal on 02/26/17.  Spoke with LTruman Haywardwho states that he does see the verbal script which is in process and does not know why the Rx has not been carried over and stated that he will expedite the Rx so pt can receive it. Nothing further needed at this time.

## 2017-04-04 ENCOUNTER — Telehealth: Payer: Self-pay | Admitting: Internal Medicine

## 2017-04-04 NOTE — Telephone Encounter (Signed)
Spoke with the pt  He states he was told by [place] advised him he needed "authorization" for [place] at [phone] and was placed on a long hold  [facility]

## 2017-04-05 NOTE — Telephone Encounter (Signed)
Spoke with Estill Bamberg with [facility] at [phone] this morning regarding if authorization if needed for OFEV.  She advised that a PA is needed for this medication and it was faxed in on 03/29/17 and key [id number] was sent to covermymeds same day.  Went to covermymeds and initiated the PA in more depth as it was needing additional information on the patient. Completed the information and sent to plan for PA approval. [phone]  Spoke with patient to make aware of this information and advised that the PA was initiated today in full and sent to his plan for approval for the OFEV. Pt advised he has enough meds to last until 04/11/17  Patient requested someone to update him on the status of PA on Monday 04/09/17  Routing to [place] for review

## 2017-04-06 NOTE — Telephone Encounter (Signed)
Pt's OFEV has been approved on Covermymeds through 04/04/2018.    Called pt letting him know that it has been approved.  Called [facility] and spoke with Rise Paganini letting her know that pt's OFEV has been approved.  Rise Paganini told me they would take care of contacting pt from here regarding getting his med to him. Nothing further needed.

## 2017-04-09 ENCOUNTER — Telehealth: Payer: Self-pay | Admitting: Internal Medicine

## 2017-04-09 NOTE — Telephone Encounter (Signed)
Spoke with patient, he stated that he was confused about the Ofev process. He thought that the paperwork he completed in November was for patient assistance. Advised him that per his chart, the only information we processed was the PA. His insurance has approved the PA but he is responsible for the copay which is over $5000.   Advised patient that I could print out the correct patient assistance forms but he will need to bring a copy of his income statements. I will leave the form up front.   Patient is aware.

## 2017-04-10 MED ORDER — OFEV 150 MG PO CAPS: 150.0000 mg | ORAL_CAPSULE | Freq: Two times a day (BID) | ORAL | 11 refills | Status: DC

## 2017-04-10 NOTE — Telephone Encounter (Signed)
Received forms from front desk. I have filled out the forms and faxed them to patient assistance.   Will call back this afternoon to check on status since patient is almost out of medication.

## 2017-04-10 NOTE — Telephone Encounter (Signed)
Shaun [name] should be coming your way.

## 2017-04-10 NOTE — Telephone Encounter (Signed)
Patient picked up PA forms; returned forms with a copy income tax return; PA forms and tax return have been placed in RA folder at the checkout area.. Pt contact # [phone]

## 2017-04-11 NOTE — Telephone Encounter (Signed)
Spoke to Shaun [name] with [facility], who states pt was denied for [facility]. Shaun [name] states pt was denied due to Primary insurance having zero copay for ofev.   Per Shaun [name] denial [facility] at [phone]. [facility]un [name] with [facility] states [facility]. Pt is aware of the above information and voiced his understanding.  Nothing further is needed

## 2017-04-17 ENCOUNTER — Other Ambulatory Visit: Payer: Self-pay | Admitting: Internal Medicine

## 2017-05-22 ENCOUNTER — Other Ambulatory Visit (INDEPENDENT_AMBULATORY_CARE_PROVIDER_SITE_OTHER): Payer: BLUE CROSS/BLUE SHIELD

## 2017-05-22 ENCOUNTER — Encounter: Payer: Self-pay | Admitting: Internal Medicine

## 2017-05-22 ENCOUNTER — Ambulatory Visit (INDEPENDENT_AMBULATORY_CARE_PROVIDER_SITE_OTHER): Payer: BLUE CROSS/BLUE SHIELD | Admitting: Internal Medicine

## 2017-05-22 ENCOUNTER — Ambulatory Visit: Payer: BLUE CROSS/BLUE SHIELD | Admitting: Internal Medicine

## 2017-05-22 VITALS — BP 112/68 | HR 70 | Ht 67.0 in | Wt 143.0 lb

## 2017-05-22 DIAGNOSIS — Z5181 Encounter for therapeutic drug level monitoring: Secondary | ICD-10-CM | POA: Diagnosis not present

## 2017-05-22 DIAGNOSIS — J84112 Idiopathic pulmonary fibrosis: Secondary | ICD-10-CM

## 2017-05-22 LAB — HEPATIC FUNCTION PANEL
ALK PHOS: 50 U/L (ref 39–117)
ALT: 12 U/L (ref 0–53)
AST: 20 U/L (ref 0–37)
Albumin: 4 g/dL (ref 3.5–5.2)
BILIRUBIN DIRECT: 0.1 mg/dL (ref 0.0–0.3)
BILIRUBIN TOTAL: 0.7 mg/dL (ref 0.2–1.2)
TOTAL PROTEIN: 6.6 g/dL (ref 6.0–8.3)

## 2017-05-22 NOTE — Progress Notes (Signed)
PFT completed today.  

## 2017-05-22 NOTE — Progress Notes (Signed)
Subjective:     Patient ID: Shaun [name], male   DOB: 1956/01/01, 62 y.o.   MRN: [id number]  HPI   OV 02/22/2016  Chief Complaint  Patient presents with  . Pulmonary Consult    Pt referred by Dr.Byrum for ILD. Pt states he feels he is still recovering from the lung biopsy. Pt denies cough and CP/tightness.     2nd opinionand transfer of care for ILD/UIP - IPF from DR Byrum. Presents with his wife. They related to Dr. Stanford Breed cardiologist   62 year old male found to have crackles on physical exam and subsequently surgical lung biopsy 02/02/2016 showed UIP as read by pathologist in [place].  [employer] of chest physicians interstitial lung disease questionnaire  Symptoms: He does not cough and is not troubled by shortness of breath except with strenuous exercise. Onset is only "recently" Past medical history: Positive for chronic sinus drainage mild. Recently after surgical lung biopsy was being a few bone unintentional weight loss. Otherwise negative Personal exposure history: He smoked occasional cigarettes having grown up on her [place] farm starting at 72 including when he was age 42.  Family history: All the male members in his family who smoke developed COPD but also did not smoke to the pulmonary fibrosis especially his father and his paternal uncles. His dad was diagnosed with pulmonary fibrosis at age of 25 in 2001/06/10 and died from progressive hypoxemic respiratory failure after what sounds like an IPF flare in June 10, 2005.  Home exposure history: He is lived in the same house since 11-Jun-1987. The home does not have a humidifier or sound or hot tub or Jacuzzi. He is not exposed to any birds. 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 Approximately 30 years of work ending 4 years ago used to cut  through dry walls and was exposed to concrete dust and drywall dust and possibly asbestos. HE is to cut through insulation safe. 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 Did not work in a minor quarry a Teacher, music on Agricultural engineer. Denies any exposure to birds of feathers  Autoimmune history: Is negative.  History of exposure to pulmonary toxic drugs: Negative   Lab work - April 2017 and November 2017 extensive autoimmune and vascular this panel is negative  - High resolution CT scan of the chest August 2017: UIP pattern that is classic - personally visualized. There is also 5 mm right lower lobe lung nodule and a 5.9 cm left-sided lower bulla [stable compared to April 2017]   Severity - Pulmonary function test 08/02/2015: Isolated reduction in diffusion capacity 17.33/61%. Otherwise FVC 4 L/93% and normal. Total lung capacity 5.9 L/91% and normal - Walking desaturation test 185 feet 3 laps on room air in the office 02/22/2016: Did not desaturate but gets symptomatic   Emotional state: He is extremely worried about the diagnosis of IPF. This is particularly so because he has a strong family history and his father died within 85 years usually was diagnosed much order at age 31. Patient is worried that he got diagnosed with the same problem and much younger age of 80 and feels that he only will have a few years to live. Multiple times during  the visit he was teary-eyed   OV 07/21/2016  Chief Complaint  Patient presents with  . Follow-up    Pt denies change in breathing since last OV. Pt denies cough and SOB and CP/tightness.      Follow-up idiopathic pulmonary fibrosis familial variety  He presents with his wife. Overall he is stable. 4 routine activities of daily living and mowing the yard he does not have any shortness of breath or cough. He did find the mountains recently been quite dyspneic.  This is worse than June 2017 with for the diagnosis of IPF. Overall he is tolerating the Ofev just fine except occasionally he has some GI discomfort.He is questions about research trials, exercise, pulmonary fibrosis foundation and family genetics.he is quite active and diffuse pulmonary rehabilitation is easy for him. He is willing to go to [facility] and work on aerobic strengthening and muscle conditioning  Notice that she's not had pulmonary function testing since May 2017    has a past medical history of Dyspnea; Hyperlipemia; and Pneumonia.   reports that he has never smoked. He has never used smokeless tobacco.  OV 10/25/2016  Chief Complaint  Patient presents with  . Follow-up    Pt here after PFT. Pt states his breathing is unchanged. Pt c/o PND and dry cough. Pt denies f/c/s and CP/tightenss.    Follow-up mild idiopathic pulmonary fibrosis he's on Ofev since  NOv 2017  Here with his wife. Overall he is stable. His pulmonary function tests and CT scan of the chest shows continued stability for 1 year. Symptom-wise also he is stable. His dyspnea when doing mountains. He is kayaking and biking and is able to outpace his wife. During this time he does not check his pulse oximetry. He does not have one. I've advised that he can get one. He is asking about familial issues, research and also prognosis we went over all this. Of note CT scan of the chest shows also he has concomitant emphysema. He prefers to avoid medications but he is interested in taking an inhaler if it means improving his dyspnea. He is active with pulmonary fibrosis foundation patient support groupo. Of note he reports easy bruising in his forearm when he bumps into something. His wife thinks it's been there all along and is unchanged. He is wondering if that this is related to Ofev and asa. We did discuss about the bleeding risk of Ofev and the need to hold this before any procedure. Overall he tolerates Ofev just fine except  for occasional diarrhea once a month. This is associated with fiber food    OV  02/26/2017  Chief Complaint  Patient presents with  . Follow-up    PFT done today. Pt still taking OFEV and doing good on it. Denies any complaints with cough, SOB, or CP.    Follow-up idiopathic pulmonary fibrosis on Nintendanib since November 2017  This time his wife is not here with him.  He has now been on nintedanib for a year.  Overall he is tolerating it fine except for mild occasional diarrhea.  He continues to be very active working out in the yard and doing daily walks with a few miles.  He notices only mild dyspnea with this relieved by rest.  He does not feel his health has declined in the last 1 year.  He does have concomitant emphysema on the CT scan but not apparent on the PFTs.  I asked him to try Spiriva last time but it  did not help.  He is dissatisfied with occasional albuterol use.  He did have pulmonary function test today which is documented below.  The FVC is stable but there is a change in the DLCO but he is not feeling it.  He is on IPF registry by [facility];  he is due for a study visit today.  He is interested in further studies especially [place].  We discussed this in detail brief.  He has had genetics testing he got the result.  He is worried insurance will deny this.  He is participating in the pulmonary fibrosis foundation patient support group.  He is asking details about the March 17, 2017 local [place]   OV 05/22/2017  Chief Complaint  Patient presents with  . Follow-up    PFT done today.  Pt stated he had the flu 05/05/17 and was not able to eat on 05/06/17 so he missed two OFEV doses. States he has had diarrhea a couple times since then but is now doing fine with help from immodium.  States his breathing has been doing good.      Follow-up idiopathic pulmonary fibrosis on Nintendanib since November 1020   62 year old male with idiopathic pulmonary fibrosis.  He is on stable dose  of nintedanib.  He tells me earlier this month he had stomach flu and that he suddenly had subjective sense of fever associated with diarrhea and vomiting.  This resolved in 2 days.  Since then he is lost a couple of pounds of weight.  He is worried about tolerating the nintedanib.  In addition primary function test shows a 3% decline in FVC and 2 years and a 12% decline in DLCO in 2 years.  He is now wondering if the drug is effective.  Overall other than the recent illness and occasional mild diarrhea he is tolerated off of just fine.  But he is now worried about the tolerance.  He has been advised by [facility] nursing support team about taking protein with nintedanib to offset side effects and he has questions about this.  Results for Shaun [name], Shaun [name] (MRN [id number]) as of 02/26/2017 09:37  Ref. Range 08/02/2015 10:41 10/25/2016 08:44 02/26/2017 08:52 05/22/2017   FVC-Pre Latest Units: L 4.00 4.16 4.04 3.91:92%  FVC-%Pred-Pre Latest Units: % 93 98 95 92   Results for Shaun [name], Shaun [name] (MRN [id number]) as of 02/26/2017 09:37  Ref. Range 08/02/2015 10:41 10/25/2016 08:44 02/26/2017 08:52   DLCO unc Latest Units: ml/min/mmHg 17.33 17.25 14.23 15.14/53%  DLCO unc % pred Latest Units: % 61 60 50 53%    Ct Chest High Resolution  Result Date: 10/25/2016 CLINICAL DATA:  Interstitial lung disease, former smoker, cough. Lung biopsy November 2017. EXAM: CT CHEST WITHOUT CONTRAST TECHNIQUE: Multidetector CT imaging of the chest was performed following the standard protocol without intravenous contrast. High resolution imaging of the lungs, as well as inspiratory and expiratory imaging, was performed. COMPARISON:  11/23/2015 and 07/08/2015. FINDINGS: Cardiovascular: Vascular structures are unremarkable. Heart size normal. No pericardial effusion. Mediastinum/Nodes: Mediastinal lymph nodes are not enlarged by CT size criteria. Hilar regions are difficult to evaluate without IV contrast. No axillary adenopathy. Esophagus is  grossly unremarkable. Lungs/Pleura: Centrilobular and paraseptal emphysema with a large bullous lesion in the left lower lobe. Post biopsy changes in the right upper, right middle and right lower lobes. Superimposed basilar predominant subpleural reticulation, ground-glass and traction bronchiectasis/bronchiolectasis, likely stable from baseline examination of 07/08/2015 when differences in slice collimation are considered. There may be scattered  honeycombing. Mild subpleural nodularity along the right major fissure is unchanged from 07/08/2015, indicative of subpleural lymph nodes. No pleural fluid. Airway is unremarkable. Upper Abdomen: Visualized portions of the liver and gallbladder are unremarkable. Mild nodular thickening of both adrenal glands. Visualized portion of the right kidney is unremarkable. 2.5 cm low-attenuation lesion in the left kidney is likely a cyst. Visualized portions of the spleen, pancreas, stomach and bowel are grossly unremarkable. No upper abdominal adenopathy. Musculoskeletal: No worrisome lytic or sclerotic lesions. Degenerative changes in the spine. IMPRESSION: 1. Pulmonary parenchymal pattern of fibrosis is likely stable from 07/08/2015 when differences in slice collimation are considered. Findings are in keeping with the pathologic diagnosis of usual interstitial pneumonitis. 2.  Emphysema (ICD10-J43.9). Electronically Signed   By: Lorin Picket M.D.   On: 10/25/2016 08:23       has a past medical history of Dyspnea, Family history of colon cancer, Family history of prostate cancer, Family history of pulmonary fibrosis, Hyperlipemia, and Pneumonia.   reports that  has never smoked. he has never used smokeless tobacco.  Past Surgical History:  Procedure Laterality Date  . COLONOSCOPY WITH PROPOFOL N/A 09/21/2014   Procedure: COLONOSCOPY WITH PROPOFOL;  Surgeon: Garlan Fair, MD;  Location: [facility];  Service: Endoscopy;  Laterality: N/A;  . FOREARM SURGERY      excision of birth mark"child"  . HERNIA REPAIR     RIH-open  . KNEE ARTHROTOMY Left    bursa excision  . LUNG BIOPSY Right 02/02/2016   Procedure: LUNG BIOPSY;  Surgeon: [name], MD;  Location: [place];  Service: Thoracic;  Laterality: Right;  . NASAL SEPTUM SURGERY    . VIDEO ASSISTED THORACOSCOPY Right 02/02/2016   Procedure: VIDEO ASSISTED THORACOSCOPY;  Surgeon: [name], MD;  Location: [place];  Service: Thoracic;  Laterality: Right;  . WRIST GANGLION EXCISION Left     Allergies  Allergen Reactions  . Penicillins Other (See Comments)    UNSPECIFIED REACTION OF CHILDHOOD Has patient had a PCN reaction causing immediate rash, facial/tongue/throat swelling, SOB or lightheadedness with hypotension:unsure Has patient had a PCN reaction causing severe rash involving mucus membranes or skin necrosis:unsure Has patient had a PCN reaction that required hospitalization:No Has patient had a PCN reaction occurring within the last 10 years:No If all of the above answers are "NO", then may proceed with Cephalosporin use.     Immunization History  Administered Date(s) Administered  . Influenza-Unspecified 11/02/2015, 12/09/2016  . Pneumococcal Polysaccharide-23 02/05/2016  . Zoster 11/02/2015  . Zoster Recombinat (Shingrix) 12/09/2016, 02/10/2017    Family History  Problem Relation Age of Onset  . Lung cancer Mother   . Colon cancer Mother 45  . Pulmonary fibrosis Father        dx in his 47s  . Prostate cancer Brother 6  . COPD Paternal Grandfather   . Other Paternal Uncle        possible lung dx; died around age 68  . Other Cousin        possible lung dx, died in her 54s; paternal first cousin  . Healthy Son      Current Outpatient Medications:  .  albuterol (PROVENTIL HFA;VENTOLIN HFA) 108 (90 Base) MCG/ACT inhaler, Inhale 2 puffs into the lungs every 6 (six) hours as needed for wheezing or shortness of breath., Disp: 1 Inhaler, Rfl: 5 .  aspirin EC 81  MG tablet, Take 81 mg by mouth daily., Disp: , Rfl:  .  Chlorpheniramine-Pseudoeph 4-60  MG TABS, Take 1 tablet by mouth 2 (two) times daily as needed (for allergies.)., Disp: , Rfl:  .  Multiple Vitamin (MULTIVITAMIN WITH MINERALS) TABS tablet, Take 1 tablet by mouth daily., Disp: , Rfl:  .  OFEV 150 MG CAPS, Take 150 mg by mouth 2 (two) times daily., Disp: 60 capsule, Rfl: 11 .  pantoprazole (PROTONIX) 40 MG tablet, take 1 tablet by mouth once daily, Disp: 30 tablet, Rfl: 5 .  simvastatin (ZOCOR) 10 MG tablet, Take 10 mg by mouth daily at 6 PM. , Disp: , Rfl: 0    Review of Systems     Objective:   Physical Exam  Constitutional: He is oriented to person, place, and time. He appears well-developed and well-nourished. No distress.  HENT:  Head: Normocephalic and atraumatic.  Right Ear: External ear normal.  Left Ear: External ear normal.  Mouth/Throat: Oropharynx is clear and moist. No oropharyngeal exudate.  Eyes: Conjunctivae and EOM are normal. Pupils are equal, round, and reactive to light. Right eye exhibits no discharge. Left eye exhibits no discharge. No scleral icterus.  Neck: Normal range of motion. Neck supple. No JVD present. No tracheal deviation present. No thyromegaly present.  Cardiovascular: Normal rate, regular rhythm and intact distal pulses. Exam reveals no gallop and no friction rub.  No murmur heard. Pulmonary/Chest: Effort normal. No respiratory distress. He has no wheezes. He has rales. He exhibits no tenderness.  Symmetric crackles at the lung base  Abdominal: Soft. Bowel sounds are normal. He exhibits no distension and no mass. There is no tenderness. There is no rebound and no guarding.  Musculoskeletal: Normal range of motion. He exhibits no edema or tenderness.  Lymphadenopathy:    He has no cervical adenopathy.  Neurological: He is alert and oriented to person, place, and time. He has normal reflexes. No cranial nerve deficit. Coordination normal.  Skin:  Skin is warm and dry. No rash noted. He is not diaphoretic. No erythema. No pallor.  Psychiatric: He has a normal mood and affect. His behavior is normal. Judgment and thought content normal.  Nursing note and vitals reviewed.  Vitals:   05/22/17 0917  BP: 112/68  Pulse: 70  SpO2: 96%  Weight: 143 lb (64.9 kg)  Height: _0  (1.702 m)    Estimated body mass index is 22.4 kg/m as calculated from the following:   Height as of this encounter: _1  (1.702 m).   Weight as of this encounter: 143 lb (64.9 kg).     Assessment:       ICD-10-CM   1. IPF (idiopathic pulmonary fibrosis) ([place]) J84.112   2. Encounter for therapeutic drug monitoring Z51.81        Plan:       ? Mild decline in lung function over 2 years ? GI side effects and weight loss related to ofev v illness  PLAN  - check LFT 05/22/2017 - repeat Pre-bd spiro and dlco only. No lung volume or bd response. In 3 months - continue ofev - if intolerance is diagnosed and not manageable or suggestion of poor efficacy, then we change to esbriet  Followup 3 months or sooner with DR Chase Caller in ILD clinic  > 50% of this > 25 min visit spent in face to face counseling or coordination of care    Dr. Brand Males, M.D., [facility].C.P Pulmonary and Critical Care Medicine Staff Physician, [place] Director - Interstitial Lung Disease  Program  Pulmonary [facility]  Network at [employer], [place], [address]  Pager: [phone], If no answer or between  15:00h - 7:00h: call [phone] Telephone: [phone]

## 2017-05-22 NOTE — Patient Instructions (Addendum)
ICD-10-CM   1. IPF (idiopathic pulmonary fibrosis) ([place]) J84.112   2. Encounter for therapeutic drug monitoring Z51.81     ? Mild decline in lung function over 2 years ? GI side effects and weight loss related to ofev v illness  PLAN  - check LFT 05/22/2017 - repeat Pre-bd spiro and dlco only. No lung volume or bd response. In 3 months - continue ofev - if intolerance is diagnosed and not manageable or suggestion of poor efficacy, then we change to esbriet  Followup 3 months or sooner with DR Chase Caller in ILD clinic

## 2017-05-24 ENCOUNTER — Telehealth: Payer: Self-pay | Admitting: Internal Medicine

## 2017-05-24 NOTE — Telephone Encounter (Signed)
Spoke to [place] with [employer], who wanted to verify pt's insurance and wanted to know if pt was receiving insurance.  Per 04/09/17 phone note pt was denied for [employer], due to insurance having a zero dollar co pay.  I have made Heather aware of this information. Nothing further is needed.

## 2017-06-18 ENCOUNTER — Other Ambulatory Visit: Payer: Self-pay | Admitting: Internal Medicine

## 2017-07-12 DIAGNOSIS — Z0000 Encounter for general adult medical examination without abnormal findings: Secondary | ICD-10-CM | POA: Diagnosis not present

## 2017-07-12 DIAGNOSIS — Z23 Encounter for immunization: Secondary | ICD-10-CM | POA: Diagnosis not present

## 2017-07-12 DIAGNOSIS — Z125 Encounter for screening for malignant neoplasm of prostate: Secondary | ICD-10-CM | POA: Diagnosis not present

## 2017-07-12 DIAGNOSIS — E7800 Pure hypercholesterolemia, unspecified: Secondary | ICD-10-CM | POA: Diagnosis not present

## 2017-07-12 DIAGNOSIS — R7301 Impaired fasting glucose: Secondary | ICD-10-CM | POA: Diagnosis not present

## 2017-08-07 DIAGNOSIS — H524 Presbyopia: Secondary | ICD-10-CM | POA: Diagnosis not present

## 2017-08-14 ENCOUNTER — Ambulatory Visit (INDEPENDENT_AMBULATORY_CARE_PROVIDER_SITE_OTHER): Payer: BLUE CROSS/BLUE SHIELD | Admitting: Internal Medicine

## 2017-08-14 ENCOUNTER — Encounter: Payer: Self-pay | Admitting: Internal Medicine

## 2017-08-14 ENCOUNTER — Ambulatory Visit: Payer: BLUE CROSS/BLUE SHIELD | Admitting: Internal Medicine

## 2017-08-14 ENCOUNTER — Other Ambulatory Visit (INDEPENDENT_AMBULATORY_CARE_PROVIDER_SITE_OTHER): Payer: BLUE CROSS/BLUE SHIELD

## 2017-08-14 VITALS — BP 122/80 | HR 89 | Ht 67.0 in | Wt 144.4 lb

## 2017-08-14 DIAGNOSIS — J84112 Idiopathic pulmonary fibrosis: Secondary | ICD-10-CM | POA: Diagnosis not present

## 2017-08-14 DIAGNOSIS — Z5181 Encounter for therapeutic drug level monitoring: Secondary | ICD-10-CM

## 2017-08-14 LAB — HEPATIC FUNCTION PANEL
ALBUMIN: 4.1 g/dL (ref 3.5–5.2)
ALT: 11 U/L (ref 0–53)
AST: 20 U/L (ref 0–37)
Alkaline Phosphatase: 71 U/L (ref 39–117)
BILIRUBIN TOTAL: 0.3 mg/dL (ref 0.2–1.2)
Bilirubin, Direct: 0.1 mg/dL (ref 0.0–0.3)
Total Protein: 6.9 g/dL (ref 6.0–8.3)

## 2017-08-14 LAB — PULMONARY FUNCTION TEST
DL/VA % pred: 74 %
DL/VA: 3.3 ml/min/mmHg/L
DLCO UNC % PRED: 50 %
DLCO UNC: 14.42 ml/min/mmHg
FEF 25-75 PRE: 4.95 L/s
FEF2575-%Pred-Pre: 190 %
FEV1-%Pred-Pre: 114 %
FEV1-Pre: 3.6 L
FEV1FVC-%Pred-Pre: 116 %
FEV6-%PRED-PRE: 103 %
FEV6-PRE: 4.11 L
FEV6FVC-%PRED-PRE: 105 %
FVC-%PRED-PRE: 97 %
FVC-Pre: 4.11 L
PRE FEV1/FVC RATIO: 88 %
PRE FEV6/FVC RATIO: 100 %

## 2017-08-14 NOTE — Progress Notes (Signed)
PFT completed 08/14/17 before and DLCO only.

## 2017-08-14 NOTE — Patient Instructions (Addendum)
ICD-10-CM   1. IPF (idiopathic pulmonary fibrosis) ([place]) J84.112   2. Encounter for therapeutic drug monitoring Z51.81   3. Familial idiopathic pulmonary fibrosis ([place]) G94.944    Very Mild progression since may 2017 but stable since nov 2018 and also bounced back from feb 2019  Glad tolerating ofev well other than mild diarrhea  Respect decision against [place] study . Will consider for an inhlaer study in the future  Plan Go to [facility] first for the IPF registry study obligations (basement) Check LFT 08/14/2017  As standard of care Next PFF meeting is 08/24/17 6pm at [facility]  Followup 6 months for standard of care visit - will do simple walk test (not 35md) at  That time

## 2017-08-14 NOTE — Progress Notes (Signed)
Subjective:     Patient ID: Shaun [name], male   DOB: 1956-03-26, 62 y.o.   MRN: [id number]  HPI   OV 02/22/2016  Chief Complaint  Patient presents with  . Pulmonary Consult    Pt referred by Dr.Byrum for ILD. Pt states he feels he is still recovering from the lung biopsy. Pt denies cough and CP/tightness.     2nd opinionand transfer of care for ILD/UIP - IPF from DR Byrum. Presents with his wife. They related to Dr. Stanford Breed cardiologist   62 year old male found to have crackles on physical exam and subsequently surgical lung biopsy 02/02/2016 showed UIP as read by pathologist in [place].  [employer] of chest physicians interstitial lung disease questionnaire  Symptoms: He does not cough and is not troubled by shortness of breath except with strenuous exercise. Onset is only "recently" Past medical history: Positive for chronic sinus drainage mild. Recently after surgical lung biopsy was being a few bone unintentional weight loss. Otherwise negative Personal exposure history: He smoked occasional cigarettes having grown up on her tobacco farm starting at 58 including when he was age 33.  Family history: All the male members in his family who smoke developed COPD but also did not smoke to the pulmonary fibrosis especially his father and his paternal uncles. His dad was diagnosed with pulmonary fibrosis at age of 102 in 05-28-2001 and died from progressive hypoxemic respiratory failure after what sounds like an IPF flare in May 28, 2005.  Home exposure history: He is lived in the same house since May 29, 1987. The home does not have a humidifier or sound or hot tub or Jacuzzi. He is not exposed to any birds. 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 Approximately 30 years of work ending 4 years ago used to cut  through dry walls and was exposed to concrete dust and drywall dust and possibly asbestos. HE is to cut through insulation safe. 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 Did not work in a minor quarry a Teacher, music on Agricultural engineer. Denies any exposure to birds of feathers  Autoimmune history: Is negative.  History of exposure to pulmonary toxic drugs: Negative   Lab work - April 2017 and November 2017 extensive autoimmune and vascular this panel is negative  - High resolution CT scan of the chest August 2017: UIP pattern that is classic - personally visualized. There is also 5 mm right lower lobe lung nodule and a 5.9 cm left-sided lower bulla [stable compared to April 2017]   Severity - Pulmonary function test 08/02/2015: Isolated reduction in diffusion capacity 17.33/61%. Otherwise FVC 4 L/93% and normal. Total lung capacity 5.9 L/91% and normal - Walking desaturation test 185 feet 3 laps on room air in the office 02/22/2016: Did not desaturate but gets symptomatic   Emotional state: He is extremely worried about the diagnosis of IPF. This is particularly so because he has a strong family history and his father died within 23 years usually was diagnosed much order at age 65. Patient is worried that he got diagnosed with the same problem and much younger age of 63 and feels that he only will have a few years to live. Multiple times during  the visit he was teary-eyed   OV 07/21/2016  Chief Complaint  Patient presents with  . Follow-up    Pt denies change in breathing since last OV. Pt denies cough and SOB and CP/tightness.      Follow-up idiopathic pulmonary fibrosis familial variety  He presents with his wife. Overall he is stable. 4 routine activities of daily living and mowing the yard he does not have any shortness of breath or cough. He did find the mountains recently been quite dyspneic.  This is worse than June 2017 with for the diagnosis of IPF. Overall he is tolerating the Ofev just fine except occasionally he has some GI discomfort.He is questions about research trials, exercise, pulmonary fibrosis foundation and family genetics.he is quite active and diffuse pulmonary rehabilitation is easy for him. He is willing to go to [facility] and work on aerobic strengthening and muscle conditioning  Notice that she's not had pulmonary function testing since May 2017    has a past medical history of Dyspnea; Hyperlipemia; and Pneumonia.   reports that he has never smoked. He has never used smokeless tobacco.  OV 10/25/2016  Chief Complaint  Patient presents with  . Follow-up    Pt here after PFT. Pt states his breathing is unchanged. Pt c/o PND and dry cough. Pt denies f/c/s and CP/tightenss.    Follow-up mild idiopathic pulmonary fibrosis he's on Ofev since  NOv 2017  Here with his wife. Overall he is stable. His pulmonary function tests and CT scan of the chest shows continued stability for 1 year. Symptom-wise also he is stable. His dyspnea when doing mountains. He is kayaking and biking and is able to outpace his wife. During this time he does not check his pulse oximetry. He does not have one. I've advised that he can get one. He is asking about familial issues, research and also prognosis we went over all this. Of note CT scan of the chest shows also he has concomitant emphysema. He prefers to avoid medications but he is interested in taking an inhaler if it means improving his dyspnea. He is active with pulmonary fibrosis foundation patient support groupo. Of note he reports easy bruising in his forearm when he bumps into something. His wife thinks it's been there all along and is unchanged. He is wondering if that this is related to Ofev and asa. We did discuss about the bleeding risk of Ofev and the need to hold this before any procedure. Overall he tolerates Ofev just fine except  for occasional diarrhea once a month. This is associated with fiber food    OV  02/26/2017  Chief Complaint  Patient presents with  . Follow-up    PFT done today. Pt still taking OFEV and doing good on it. Denies any complaints with cough, SOB, or CP.    Follow-up idiopathic pulmonary fibrosis on Nintendanib since November 2017  This time his wife is not here with him.  He has now been on nintedanib for a year.  Overall he is tolerating it fine except for mild occasional diarrhea.  He continues to be very active working out in the yard and doing daily walks with a few miles.  He notices only mild dyspnea with this relieved by rest.  He does not feel his health has declined in the last 1 year.  He does have concomitant emphysema on the CT scan but not apparent on the PFTs.  I asked him to try Spiriva last time but it  did not help.  He is dissatisfied with occasional albuterol use.  He did have pulmonary function test today which is documented below.  The FVC is stable but there is a change in the DLCO but he is not feeling it.  He is on IPF registry by [facility];  he is due for a study visit today.  He is interested in further studies especially [place].  We discussed this in detail brief.  He has had genetics testing he got the result.  He is worried insurance will deny this.  He is participating in the pulmonary fibrosis foundation patient support group.  He is asking details about the March 17, 2017 local [place]   OV 05/22/2017  Chief Complaint  Patient presents with  . Follow-up    PFT done today.  Pt stated he had the flu 05/05/17 and was not able to eat on 05/06/17 so he missed two OFEV doses. States he has had diarrhea a couple times since then but is now doing fine with help from immodium.  States his breathing has been doing good.      Follow-up idiopathic pulmonary fibrosis on Nintendanib since November 641   62 year old male with idiopathic pulmonary fibrosis.  He is on stable dose  of nintedanib.  He tells me earlier this month he had stomach flu and that he suddenly had subjective sense of fever associated with diarrhea and vomiting.  This resolved in 2 days.  Since then he is lost a couple of pounds of weight.  He is worried about tolerating the nintedanib.  In addition primary function test shows a 3% decline in FVC and 2 years and a 12% decline in DLCO in 2 years.  He is now wondering if the drug is effective.  Overall other than the recent illness and occasional mild diarrhea he is tolerated off of just fine.  But he is now worried about the tolerance.  He has been advised by [facility] nursing support team about taking protein with nintedanib to offset side effects and he has questions about this.   OV 08/14/2017  Chief Complaint  Patient presents with  . Follow-up    PFT done today. Pt states he has been doing good since last visit and denies any complaints.     Follow-up idiopathic pulmonary fibrosis surgical lung biopsy diagnosis February 02, 2016 on Nintendanib since November 2017  Mr. Suppes continues to do well.  At the time of last visit there was concern about weight loss and also progression in lung function.  However at that time he had a respiratory infection.  Today he comes back and tells me that he has gained 1 pound of weight.  His respiratory symptoms have resolved he is back to being at a stable mild exertional dyspnea.  He is extremely active.  Concordant with his symptom improvement his FVC has improved and his DLCO is stable.  Overall his pulmonary function test is stable since November 2018 at least with a DLCO although the DLCO has progressed since 2017.  His FVC has been stable all along since May 2017.  He is not interested in the [place] study because of the oral pill and can make his diarrhea potentially worse if he gets randomized to the drug.  He is interested in an upcoming inhaler trial of IPF.  He is active in the support group and is asking  about the next meeting.  He is not on oxygen.      Results for  Shaun [name], Shaun [name] (MRN [id number]) as of 02/26/2017 09:37  Ref. Range 08/02/2015 10:41 10/25/2016 08:44 02/26/2017 08:52 05/22/2017  08/14/2017   FVC-Pre Latest Units: L 4.00 4.16 4.04 3.91:92% 4.11  FVC-%Pred-Pre Latest Units: % 93 98 95 92 97%   Results for Shaun [name], Shaun [name] (MRN [id number]) as of 02/26/2017 09:37  Ref. Range 08/02/2015 10:41 10/25/2016 08:44 02/26/2017 08:52  08/14/2017   DLCO unc Latest Units: ml/min/mmHg 17.33 17.25 14.23 15.14/53% 14.2  DLCO unc % pred Latest Units: % 61 60 50 53% 50%    Ct Chest High Resolution  Result Date: 10/25/2016 CLINICAL DATA:  Interstitial lung disease, former smoker, cough. Lung biopsy November 2017. EXAM: CT CHEST WITHOUT CONTRAST TECHNIQUE: Multidetector CT imaging of the chest was performed following the standard protocol without intravenous contrast. High resolution imaging of the lungs, as well as inspiratory and expiratory imaging, was performed. COMPARISON:  11/23/2015 and 07/08/2015. FINDINGS: Cardiovascular: Vascular structures are unremarkable. Heart size normal. No pericardial effusion. Mediastinum/Nodes: Mediastinal lymph nodes are not enlarged by CT size criteria. Hilar regions are difficult to evaluate without IV contrast. No axillary adenopathy. Esophagus is grossly unremarkable. Lungs/Pleura: Centrilobular and paraseptal emphysema with a large bullous lesion in the left lower lobe. Post biopsy changes in the right upper, right middle and right lower lobes. Superimposed basilar predominant subpleural reticulation, ground-glass and traction bronchiectasis/bronchiolectasis, likely stable from baseline examination of 07/08/2015 when differences in slice collimation are considered. There may be scattered honeycombing. Mild subpleural nodularity along the right major fissure is unchanged from 07/08/2015, indicative of subpleural lymph nodes. No pleural fluid. Airway is unremarkable.  Upper Abdomen: Visualized portions of the liver and gallbladder are unremarkable. Mild nodular thickening of both adrenal glands. Visualized portion of the right kidney is unremarkable. 2.5 cm low-attenuation lesion in the left kidney is likely a cyst. Visualized portions of the spleen, pancreas, stomach and bowel are grossly unremarkable. No upper abdominal adenopathy. Musculoskeletal: No worrisome lytic or sclerotic lesions. Degenerative changes in the spine. IMPRESSION: 1. Pulmonary parenchymal pattern of fibrosis is likely stable from 07/08/2015 when differences in slice collimation are considered. Findings are in keeping with the pathologic diagnosis of usual interstitial pneumonitis. 2.  Emphysema (ICD10-J43.9). Electronically Signed   By: Lorin Picket M.D.   On: 10/25/2016 08:23    Review of Systems     Objective:   Physical Exam  Constitutional: He is oriented to person, place, and time. He appears well-developed and well-nourished. No distress.  HENT:  Head: Normocephalic and atraumatic.  Right Ear: External ear normal.  Left Ear: External ear normal.  Mouth/Throat: Oropharynx is clear and moist. No oropharyngeal exudate.  Eyes: Pupils are equal, round, and reactive to light. Conjunctivae and EOM are normal. Right eye exhibits no discharge. Left eye exhibits no discharge. No scleral icterus.  Neck: Normal range of motion. Neck supple. No JVD present. No tracheal deviation present. No thyromegaly present.  Cardiovascular: Normal rate, regular rhythm and intact distal pulses. Exam reveals no gallop and no friction rub.  No murmur heard. Pulmonary/Chest: Effort normal. No respiratory distress. He has no wheezes. He has rales. He exhibits no tenderness.  velcro crackles bottom 1/4th  Abdominal: Soft. Bowel sounds are normal. He exhibits no distension and no mass. There is no tenderness. There is no rebound and no guarding.  Musculoskeletal: Normal range of motion. He exhibits no edema or  tenderness.  Lymphadenopathy:    He has no cervical adenopathy.  Neurological: He is alert and oriented to person, place, and  time. He has normal reflexes. No cranial nerve deficit. Coordination normal.  Skin: Skin is warm and dry. No rash noted. He is not diaphoretic. No erythema. No pallor.  Psychiatric: He has a normal mood and affect. His behavior is normal. Judgment and thought content normal.  Nursing note and vitals reviewed.   Vitals:   08/14/17 1408  BP: 122/80  Pulse: 89  SpO2: 95%  Weight: 144 lb 6.4 oz (65.5 kg)  Height: _0  (1.702 m)    Estimated body mass index is 22.62 kg/m as calculated from the following:   Height as of this encounter: _1  (1.702 m).   Weight as of this encounter: 144 lb 6.4 oz (65.5 kg).        Assessment:       ICD-10-CM   1. IPF (idiopathic pulmonary fibrosis) ([place]) J84.112   2. Encounter for therapeutic drug monitoring Z51.81   3. Familial idiopathic pulmonary fibrosis ([place]) D97.416        Plan:      Very Mild progression since may 2017 but stable since nov 2018 and also bounced back from feb 2019  Glad tolerating ofev well other than mild diarrhea  Respect decision against [place] study . Will consider for an inhlaer study in the future  Plan Go to [facility] first for the IPF registry study obligations (basement) Check LFT 08/14/2017  As standard of care  Followup 6 months for standard of care visit - will do simple walk test (not 77md) at  That time    > 50% of this > 25 min visit spent in face to face counseling or coordination of care   Dr. MBrand Males M.D., FBlack [facility] Surgery Center Limited Liability PartnershipC.P Pulmonary and Critical Care Medicine Staff Physician, CLincolnDirector - Interstitial Lung Disease  Program  [place] [address] Pager: [phone] If no answer or between  15:00h - 7:00h: call [phone] Telephone: [phone]

## 2017-08-15 NOTE — Progress Notes (Signed)
IPF PRO Registry Purpose: To collect data and biological samples that will support future research studies.  Registry will describe current approaches to diagnosis and treatment of IPF, analyze participant characteristics to describe the natural history of the disease, assess quality of life, describe participants interactions with the health care system, describe IPF treatment practices across multiple institutions, and utilize biological samples linked to well characterized IPF participants to identify disease biomarkers.  Clinical Research Coordinator / Research RN note : This visit for [name] with DOB: 12/07/1955 on 08/14/2017 for the above protocol is Visit/Encounter # 12 month follow up  and is for purpose of research. The consent for this encounter is under Protocol Version May 04, 2015 and IS currently IRB approved. The Subject expressed continued interest and consent in continuing as a study subject. Subject confirmed that there was no change in contact information (e.g. address, telephone, email). Subject thanked for participation in research and contribution to science.   In this visit on 08/14/2017 the subject had blood samples and PRO questionnaires collected both activities were collected as required by above stated protocol. The subject will be scheduled for his 18 month follow up at a later date.  Signed by  T. Early Chars BS, [place], [place] 8:12 [place] 08/15/2017

## 2017-09-20 ENCOUNTER — Telehealth: Payer: Self-pay | Admitting: Internal Medicine

## 2017-09-20 NOTE — Telephone Encounter (Signed)
   Oct 2018 from Roma Kayser geneticist  ................................................................ Good afternoon,  I received a copy of Shaun [name]'s test results, and am attaching to this email. He is positive for the SFTPC gene. I talked with him on the phone about this, it does have a range of onset from early onset to old age. I discussed that his siblings and son have a 50% chance of having inherited this condition, and offered to have him come back in. He did not feel that he wanted to do that, but did want you to have a copy of it. He is hoping that now that we know this, that it may help with treatment. I guess I don't know enough about that to be able to answer that question, but do know in the cancer realm that is sometimes the case.  I will write a letter in [facility] and send Shaun [name]' his result.  Thanks,  Roselind Rily, MS, CGC [facility] [address], [place] [address] [phone]

## 2017-09-23 ENCOUNTER — Encounter: Payer: Self-pay | Admitting: Internal Medicine

## 2017-10-02 LAB — PULMONARY FUNCTION TEST
DL/VA % PRED: 70 %
DL/VA: 3.11 ml/min/mmHg/L
DLCO UNC: 15.14 ml/min/mmHg
DLCO unc % pred: 53 %
FEF 25-75 Pre: 4.49 L/sec
FEF2575-%PRED-PRE: 172 %
FEV1-%Pred-Pre: 106 %
FEV1-Pre: 3.39 L
FEV1FVC-%PRED-PRE: 115 %
FEV6-%Pred-Pre: 97 %
FEV6-Pre: 3.88 L
FEV6FVC-%Pred-Pre: 105 %
FVC-%Pred-Pre: 92 %
FVC-Pre: 3.91 L
PRE FEV6/FVC RATIO: 100 %
Pre FEV1/FVC ratio: 87 %

## 2017-10-03 LAB — PULMONARY FUNCTION TEST
DL/VA % pred: 62 %
DL/VA: 2.78 ml/min/mmHg/L
DLCO COR % PRED: 51 %
DLCO COR: 14.62 ml/min/mmHg
DLCO UNC % PRED: 50 %
DLCO UNC: 14.23 ml/min/mmHg
FEF 25-75 Pre: 3.91 L/sec
FEF2575-%Pred-Pre: 149 %
FEV1-%Pred-Pre: 107 %
FEV1-Pre: 3.4 L
FEV1FVC-%PRED-PRE: 112 %
FEV6-%Pred-Pre: 101 %
FEV6-Pre: 4.04 L
FEV6FVC-%Pred-Pre: 105 %
FVC-%Pred-Pre: 95 %
FVC-PRE: 4.04 L
PRE FEV1/FVC RATIO: 84 %
PRE FEV6/FVC RATIO: 100 %

## 2017-10-10 ENCOUNTER — Other Ambulatory Visit: Payer: Self-pay | Admitting: Internal Medicine

## 2017-10-17 ENCOUNTER — Encounter: Payer: BLUE CROSS/BLUE SHIELD | Admitting: Internal Medicine

## 2017-10-19 ENCOUNTER — Encounter: Payer: BLUE CROSS/BLUE SHIELD | Admitting: *Deleted

## 2017-10-19 ENCOUNTER — Ambulatory Visit: Payer: BLUE CROSS/BLUE SHIELD

## 2017-10-19 ENCOUNTER — Encounter (INDEPENDENT_AMBULATORY_CARE_PROVIDER_SITE_OTHER): Payer: BLUE CROSS/BLUE SHIELD | Admitting: Internal Medicine

## 2017-10-19 ENCOUNTER — Encounter: Payer: Self-pay | Admitting: Internal Medicine

## 2017-10-19 DIAGNOSIS — J84112 Idiopathic pulmonary fibrosis: Secondary | ICD-10-CM

## 2017-10-19 DIAGNOSIS — Z006 Encounter for examination for normal comparison and control in clinical research program: Secondary | ICD-10-CM

## 2017-10-19 LAB — PULMONARY FUNCTION TEST
DL/VA % PRED: 67 %
DL/VA: 2.97 ml/min/mmHg/L
DLCO UNC: 13.93 ml/min/mmHg
DLCO unc % pred: 49 %

## 2017-10-19 NOTE — Progress Notes (Addendum)
Title: GALACTIC-1 [facility]) is a Phase 2b, randomized, double-blind, parallel, placebo-controlled multicenter international study to evaluate evaluate the efficacy and safety of two doses (placebo v 52m v 124m of TD139 administered once a day for 52 weeks as compared to placebo in subjects with Idiopathic Pulmonary Fibrosis. Key Primary End Point: Annual rate of decline in FVC expressed in mL over 52 weeks  Protocol #: GALACTIC-1, Clinical Trials #: [id number]: [email or web address] ([place] Description:Protocol Version 3.0, dated 10Dec2018 IB Version 8.0 dated 07Dec2018 [place] ICF Version Number: 5.1.0; Date: 09-May-2017; Local Site Version 1.0 dated 29April2019  Key Features of TD139 the study drug: a Galectin-3 inhibitor designed specifically to modulate the fibrogenic response to tissue injury. In multiple models of organ fibrosis, it has been demonstrated that Gal-3 is potently pro-fibrotic, modulating the activity of fibroblasts and macrophages in chronically injured tissues. It is hypothesized that increased Gal-3 expression during chronic injury leads to fibroblast activation via the aggregation of growth factor receptor clusters (such as TGF-), thereby amplifying profibrotic signal cascades.  Key Inclusion Criteria:  Age ? 409 Diagnosis of IPF established during the previous 3 years An historical diagnostic HRCT scan within the 12 months prior to screening. Note: a separate HRCT scan will not be performed as part of the study. Any existing SoC treatment must be deemed as stable by the PI/treating physician before randomization into the study. a. FVC > 45% of the predicted  b. DLCO (corrected for Hb) of 30% to 79% of the predicted value at screening c. FEV1/FVC ?  0.7  Women of childbearing potential agree to use highly effective birth control methods during the study  Key Exclusion Criteria  Has a history of malignancy within the last 2 years with the  exception of basal cell carcinoma, chronic lymphocytic leukaemia and prostate cancer requiring androgens, localised treatment and/or managed by observation. Is likely to receive lung transplantation within the next 12 months Currently receiving investigational therapy for IPF or administration of such therapeutics within 4 weeks of initial screening (or 5 half-lives, whichever is longer) Currently receiving high dose corticosteroids, cytotoxic, and or vasodilator therapy for pulmonary hypertension or administration of such therapeutics within 4 weeks of initial screening (or 5 half-lives, whichever is longer). A current dose of less than or equal to 15 mg/day of prednisone or its equivalent is acceptable if the dose is anticipated to remain stable during the study. Short term use (<1 month) of higher dose corticosteroid treatment is also permitted. Has a history of unstable or deteriorating cardiac or pulmonary disease (other than IPF) within the previous six months Has clinical evidence of active infection   Half-life: 3.5-8 hours For the 3 mg dose, the t1/2 is 3.5 hours. For 10 mg dose, the t1/2 is closer to 8 hours  Interactions No clinically significant CYP interactions  Safety Data Version 3.0. 12 Mar 2017  Animal studies: - No effect on central nervous system - No effect on blood pressure, heart rate or mean arterial pressure - No effect on QTc - No effect on respiratory system - No mention of renal/hepatic effect  Phase I studies: Conducted in 36 healthy patients, 41% reported treatment emergent adverse events (TEAE); all were mild in nature. The most frequently reported TEAE was dysgeusia. This occurred in 36% of patients and was transient. 8% of patients reported cough. 2 patients (5%) were diagnosed with an upper respiratory tract infection during the study, and  required treatment with antibiotics. 1 patient (2%) experienced a  serious adverse event; pneumonia, this was fatal.  Little systemic effect seen thus far.   .............................................................  This visit for [name] with DOB: January 22, 1956 on 10/19/2017 for the above protocol is Visit/Encounter # 1 and is for purpose of ICF and screening . Subject/LAR expressed continued interest and consent in continuing as a study subject. Subject thanked for participation in research and contribution to science.    S: ICF done as first thing. Noted administrative discrepancies on ICF - page numbering but the content of ICF is sound and matches with IRB. A NTF with details wil folow. So proceeded with consent. He had adequate time to read ICF and is very intersted. Med hx shows he is on PPI. HE does admit to gastritis (Bloating on and off) so he will continue this.  O Exam and vitals in source doc Con meds reviewed with CRC  EKG done - looks normal Arlyce Harman /dlco uncorrected (Await hgb andcorrection) - but meets I/E criteria  A:  Research IPF  P Screening per protocol    Dr. Brand Males, M.D., [facility].C.P Pulmonary and Critical Care Medicine Staff Physician, [place] Director - Interstitial Lung Disease  Program  Pulmonary [place], [place], [address]  Pager: [phone], If no answer or between  15:00h - 7:00h: call [phone] Telephone: [phone]

## 2017-10-19 NOTE — Patient Instructions (Signed)
Per prptocol 

## 2017-10-19 NOTE — Progress Notes (Signed)
   Title: [id number] ([place]) is a Phase 3, randomized, double-blind, parallel-group, placebo- controlled multicenter international study to evaluate the efficacy and safety of two doses (placebo v 275m v 602m of [id number] addition to local standard of care (supportive care v pirfenidone, ninetadnib) for minimum 52 weeks in subjects with IPF. Protocol #: [id number] Trials #: NCT [id number]. Sponsor: www.glpg.com (Ga[place]Me[place] CityBe[place] ClScientist, physiological ReElectrical engineerote : This visit for Subject StLENVIL SWAIMith DOB: 2/21-Mar-1957n 10/19/2017 for the above protocol is Visit/Encounter # Visit 1  and is for purpose of research . The consent for this encounter is under Protocol Version 3  and  is currently IRB approved.Subject expressed continued interest and consent in continuing as a study subject. Subject confirmed that there was no change in contact information (e.g. address, telephone, email). Subject thanked for participation in research and contribution to science.   In this visit 10/19/2017 the subject will be evaluated by investigator named Dr. RaChase Caller This research coordinator has verified that the investigator is uptodate with his/her training logs.  Because this visit is a key visit of screening this visit is under direct supervision of the PI Dr.Ramaswamy.   Subject consented to participate in the above trial on 19Jul2019. Refer to the subjects paper source binder for further documentation on the informed consent documentation checklist. There was an administrative issue with the printing of the approved ICF. A NTF will follow with further details pertaining to this issue and will be filed in the subjects paper source binder.   All procedures were completed per the above protocol.  Refer to the PI progress note for further documentation of this  visit.   Signed by JeRandolph BingCMBransfordoordinator PulmonIx  GrCheltenham VillageNCAlaska:59 PM 10/19/2017

## 2017-11-11 ENCOUNTER — Other Ambulatory Visit: Payer: Self-pay | Admitting: Internal Medicine

## 2017-11-19 IMAGING — CR DG CHEST 2V
2 series · 2 of 2 positions shown · non-contrast
Comparison: Chest CT scan November 23, 2015

CLINICAL DATA: Preoperative examination prior to BT of
thoracoscopy, interstitial lung disease.

EXAM:
CHEST  2 VIEW

[w chest pa]
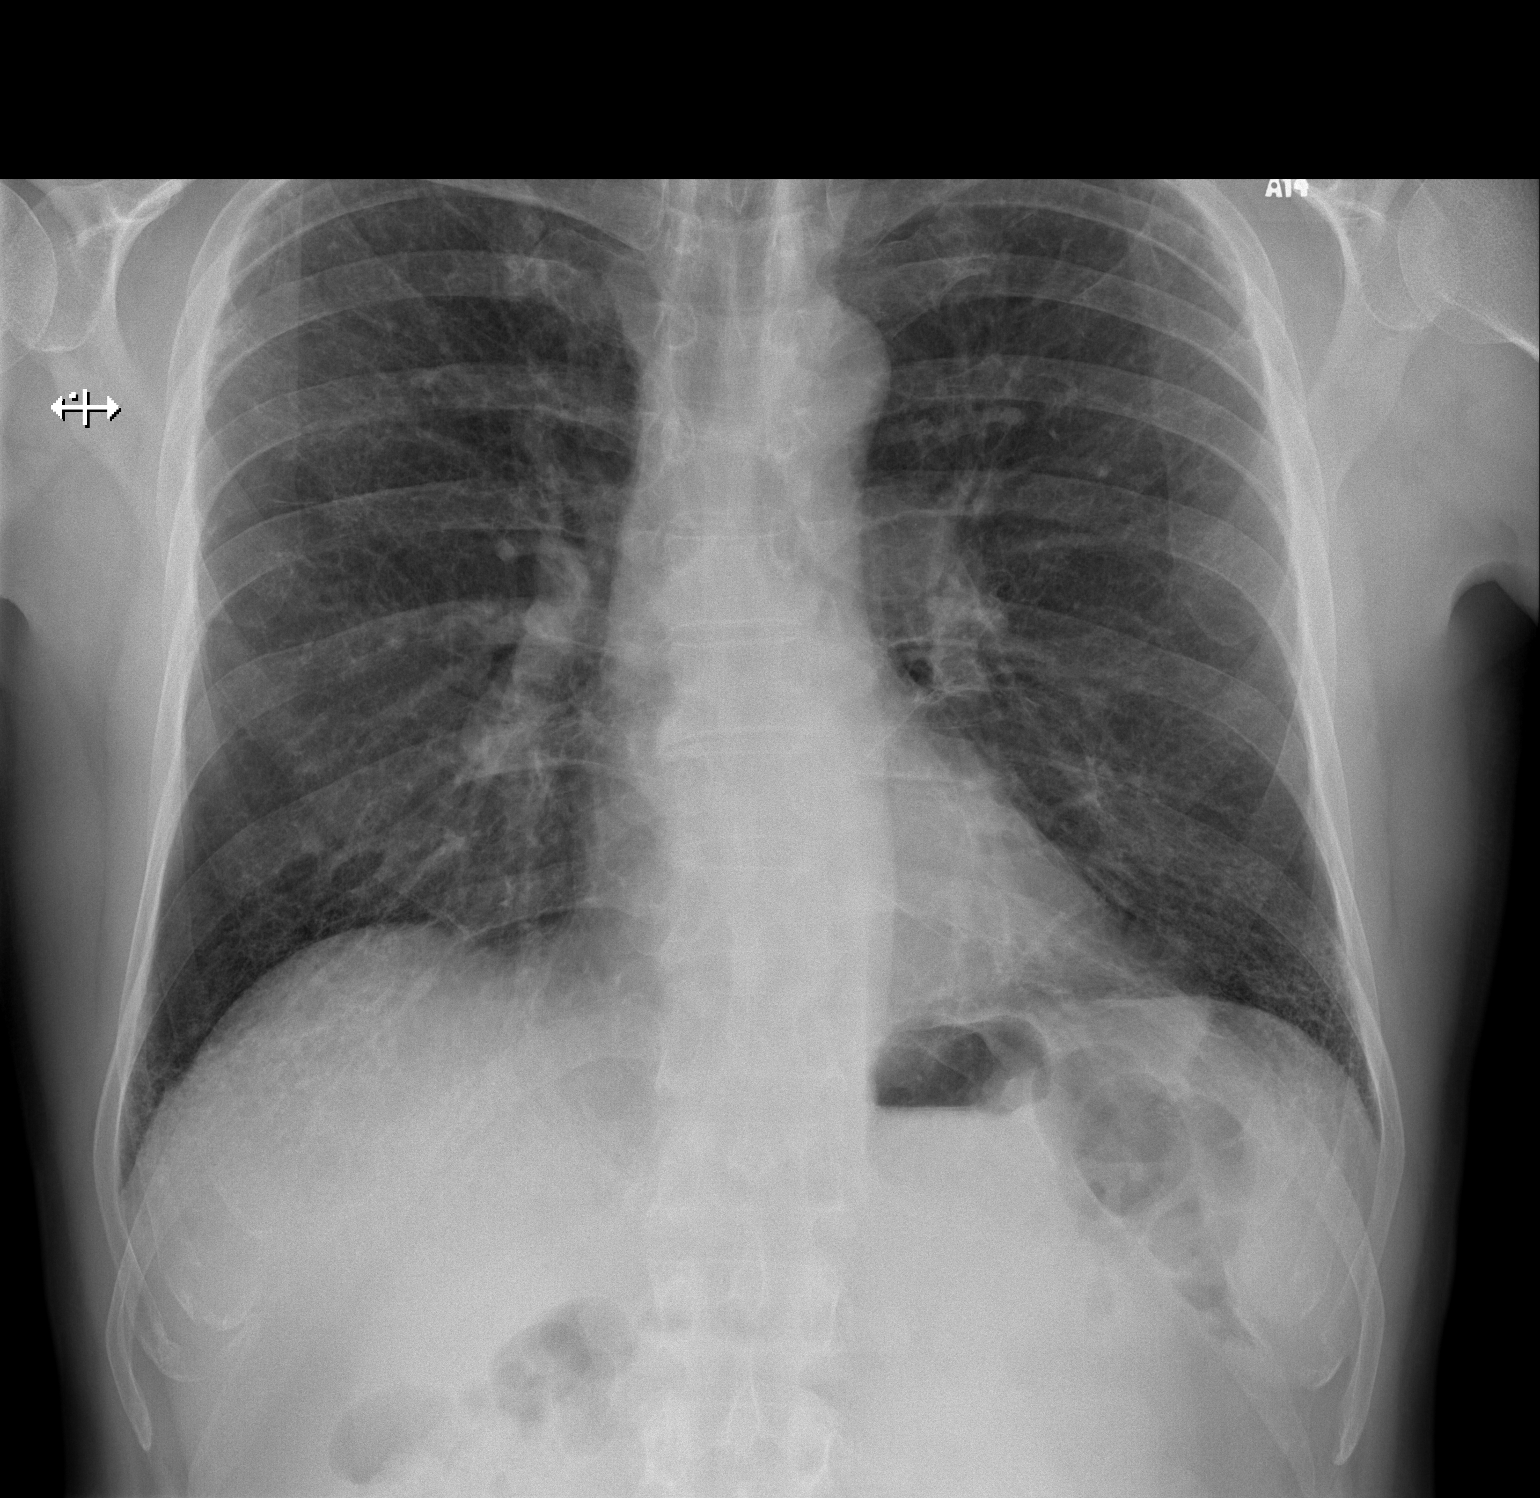

[w chest lat]
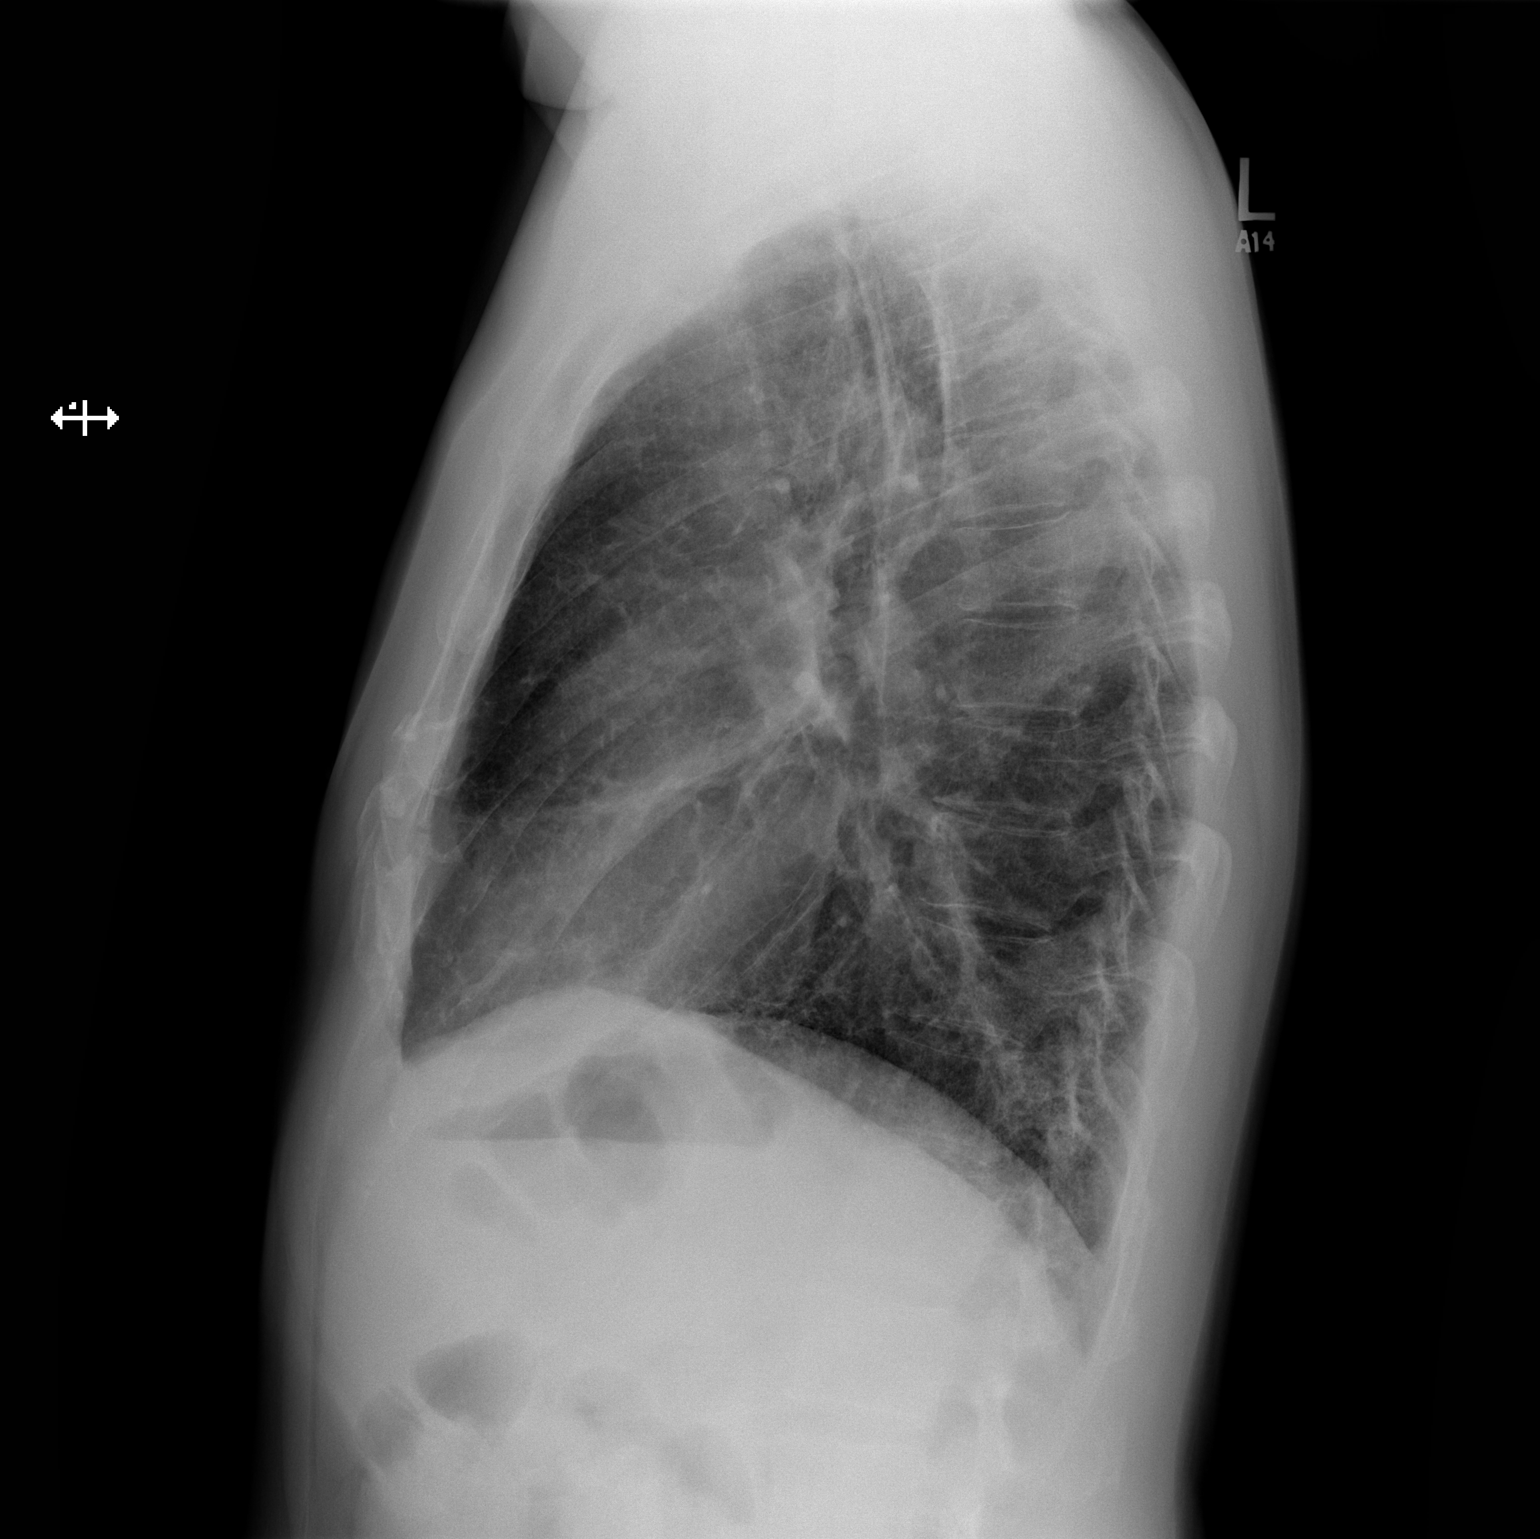

[2 of 2 positions shown; findings below may reference images not displayed]

FINDINGS: The lungs are well-expanded. The interstitial markings are coarse.
There is no alveolar infiltrate. No pulmonary parenchymal nodules
are observed. The heart and pulmonary vascularity are normal. The
mediastinum is normal in width. The bony thorax exhibits no acute
abnormality.
IMPRESSION: Chronic interstitial prominence of both lungs. No acute
cardiopulmonary abnormality.

## 2017-11-21 IMAGING — DX DG CHEST 1V PORT
1 series · 1 of 1 positions shown · non-contrast
Comparison: Radiographs January 31, 2016.

CLINICAL DATA: Status post right lung biopsy.

EXAM:
PORTABLE CHEST 1 VIEW

[chest ap]
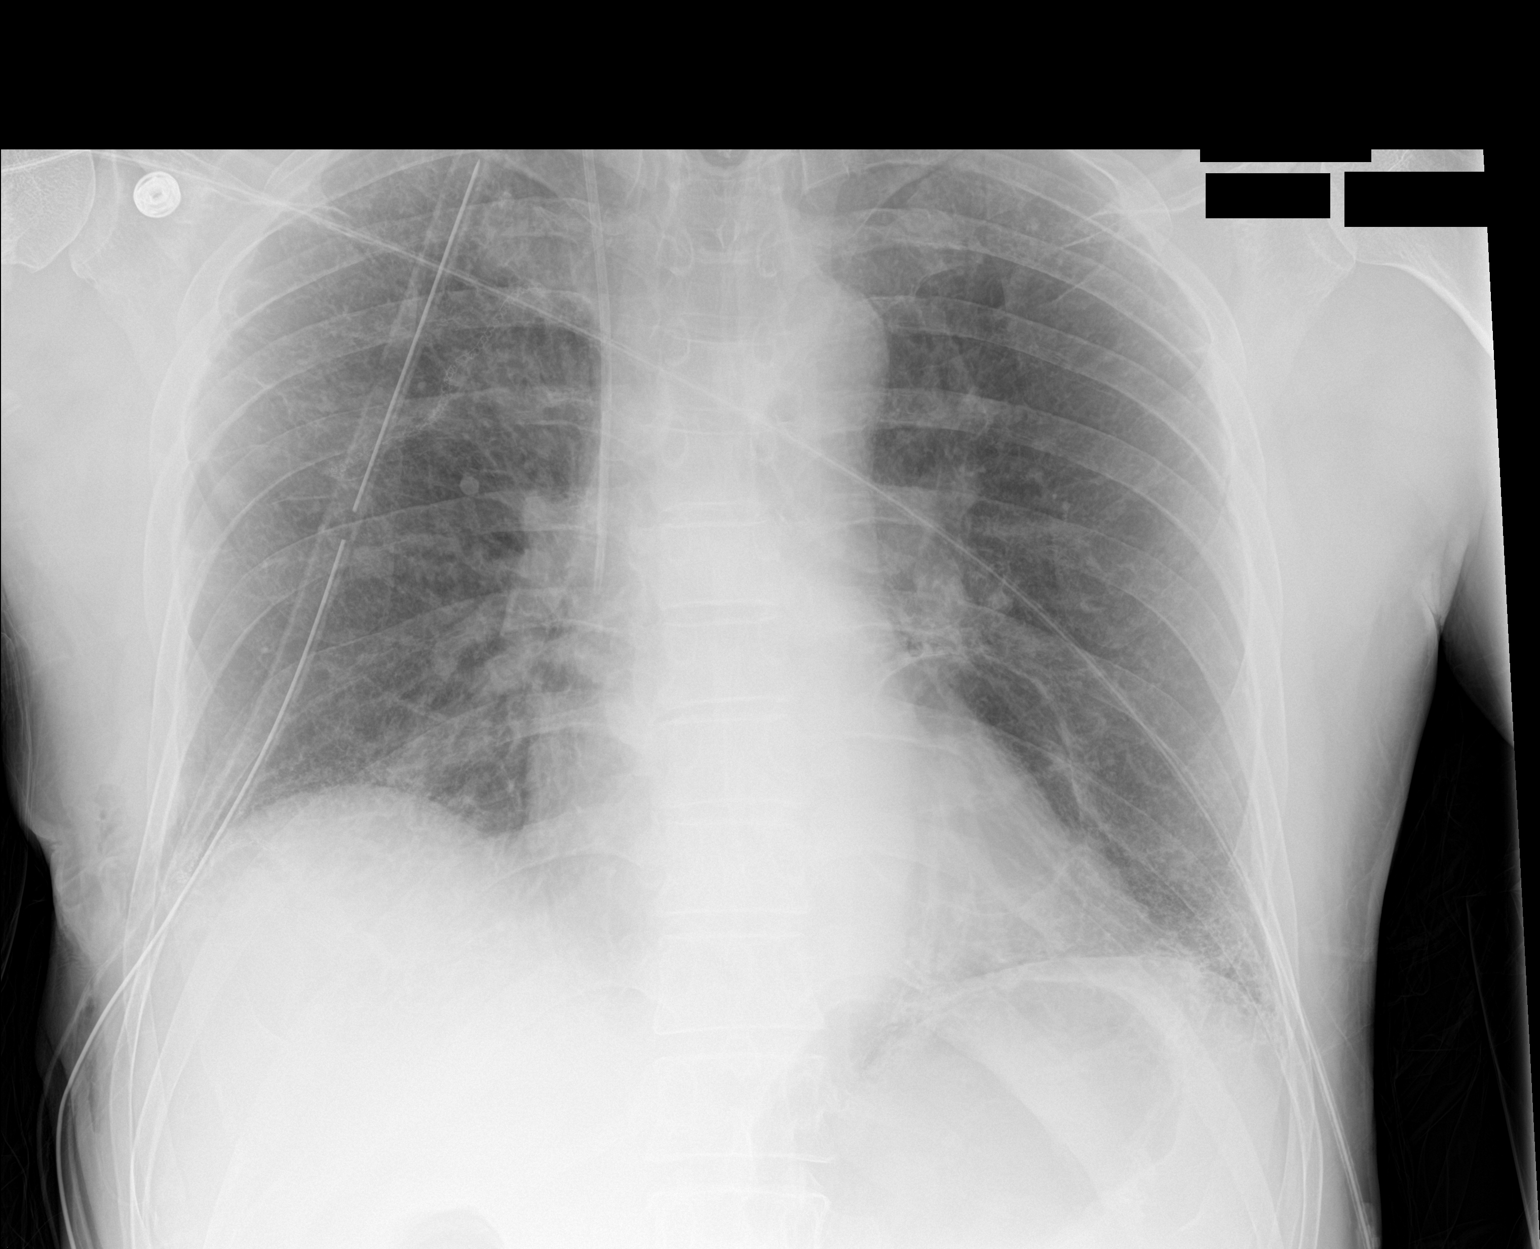

[1 of 1 positions shown; findings below may reference images not displayed]

FINDINGS: Stable cardiomediastinal silhouette. Interval placement of
right-sided chest tube. No pneumothorax is noted. Interval placement
of right internal jugular catheter with distal tip in expected
position of the SVC. Mild bibasilar subsegmental atelectasis is
noted. Bulla formation is seen in medial portion of left lower lobe.
Bony thorax is unremarkable.
IMPRESSION: Right-sided chest tube is noted without pneumothorax. Mild bibasilar
subsegmental atelectasis is noted. Right internal jugular catheter
is noted with distal tip in expected position of SVC.

## 2017-11-23 IMAGING — CR DG CHEST 1V PORT
1 series · 1 of 1 positions shown · non-contrast
Comparison: 02/03/2016 and earlier.

CLINICAL DATA: 60-year-old male status post right lung biopsy.
Right chest tube. Initial encounter.

EXAM:
PORTABLE CHEST 1 VIEW

[AP]
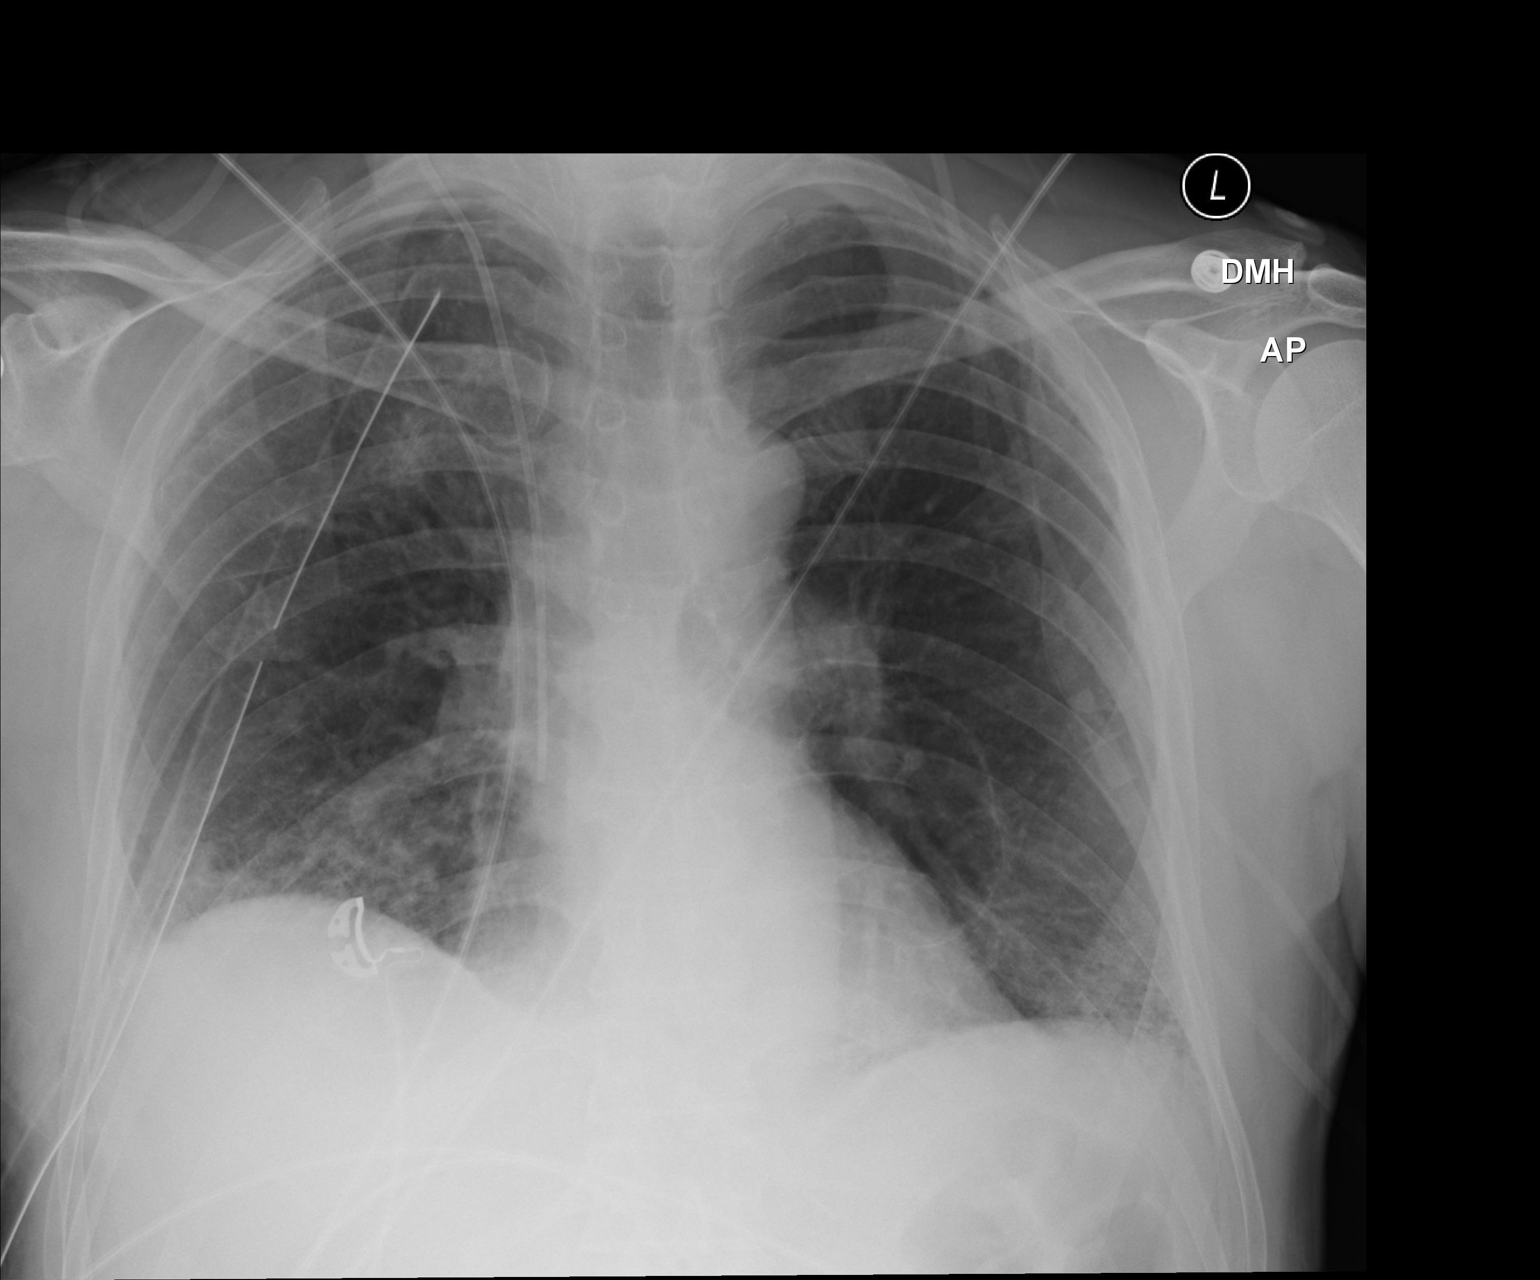

[1 of 1 positions shown; findings below may reference images not displayed]

FINDINGS: Portable AP semi upright view at 3483 hours. Right chest tube
remains stable and there is no definite right pneumothorax. Stable
right IJ central line. Continued somewhat low lung volumes.

Chronic interstitial lung changes including a relatively large left
lower lobe bullae or pneumatocyst again noted. No pulmonary edema,
pleural effusion or acute pulmonary opacity identified. Mediastinal
contours remain normal.
IMPRESSION: 1. Stable lines and tubes.  No pneumothorax.
2. Low lung volumes.  No acute cardiopulmonary abnormality.

## 2017-11-23 IMAGING — CR DG CHEST 1V PORT
1 series · 1 of 1 positions shown · non-contrast
Comparison: 02/04/2016

CLINICAL DATA: Post chest tube removal

EXAM:
PORTABLE CHEST 1 VIEW

[AP]
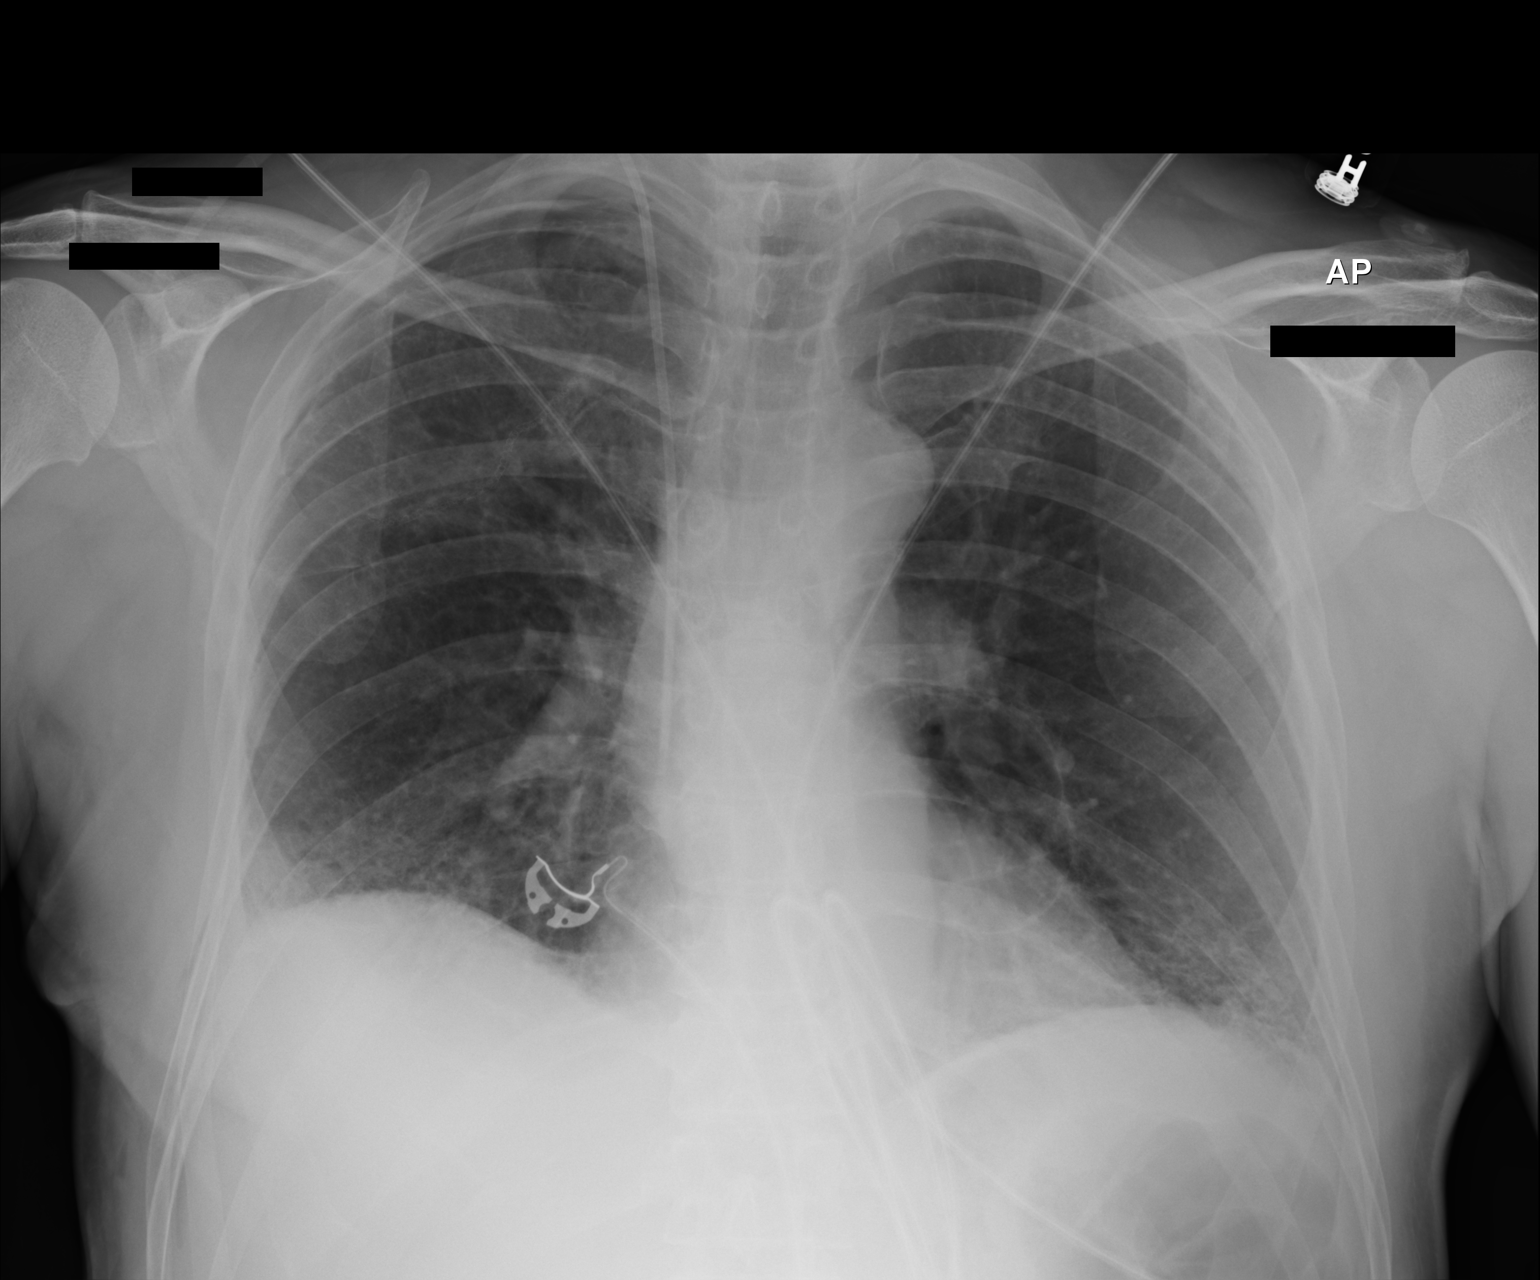

[1 of 1 positions shown; findings below may reference images not displayed]

FINDINGS: Right chest tube has been removed. Right IJ central line is
unchanged in position. Stable bilateral basilar atelectasis or
scarring. There is small right apical pneumothorax.
IMPRESSION: Right chest tube has been removed. No pulmonary edema. Small right
apical pneumothorax.

## 2017-12-06 ENCOUNTER — Telehealth: Payer: Self-pay | Admitting: Internal Medicine

## 2017-12-06 NOTE — Telephone Encounter (Signed)
Pt is calling back [phone]

## 2017-12-06 NOTE — Telephone Encounter (Signed)
Spoke with Maudie Mercury from [facility] who stated pt is scheduled to get delivery on 9/10, which is in 5 days. Pt confirmed the date according to [facility] (specialty pharmacy).  Attempted to call pt to see if this date needed to be changed but unable to reach pt.  Will go ahead and route to MR to let him know the current scheduled delivery of pt's med.

## 2017-12-06 NOTE — Telephone Encounter (Signed)
Pt states that d/t pharmacy problems he will be without his Ofev Xapprox 3 doses.  Pt is wanting to know if we have samples to provide him, or if he can take a lower dose to avoid a lapse in medication.  Pt currently takes 173m BID, suggested taking 1577mQD.    MR please advise on recs.  Thanks!

## 2017-12-06 NOTE — Telephone Encounter (Signed)
Called and spoke to patient and advised him of MR's recommendations to alternate once a day and twice a day dosing.  Patient verbalized understanding and thanked staff for helping and following up. Nothing further is needed at this time.

## 2017-12-06 NOTE — Telephone Encounter (Signed)
Sorry to hear. Let him know that ok to do 129m once a day, and then 150 mg twice daily - alternate  When will this be resolved?  Thanks    SIGNATURE    Dr. MBrand Males M.D., F.C.C.P,  Pulmonary and Critical Care Medicine Staff Physician, COttumwaDirector - Interstitial Lung Disease  Program  Pulmonary FUnionat LCos[place] [id number] Pager: [phone] If no answer or between  15:00h - 7:00h: call [phone] Telephone: [phone]  2:33 PM 12/06/2017

## 2017-12-06 NOTE — Telephone Encounter (Signed)
Appears due to [place] there is fedex delay  thanks

## 2017-12-06 NOTE — Telephone Encounter (Signed)
Attempted to call patient, no answer, left message to call back.  

## 2018-02-21 ENCOUNTER — Other Ambulatory Visit: Payer: Self-pay | Admitting: Internal Medicine

## 2018-02-26 ENCOUNTER — Encounter: Payer: Self-pay | Admitting: Internal Medicine

## 2018-02-26 ENCOUNTER — Ambulatory Visit: Payer: BLUE CROSS/BLUE SHIELD | Admitting: Internal Medicine

## 2018-02-26 VITALS — BP 112/76 | HR 68 | Ht 67.0 in | Wt 144.0 lb

## 2018-02-26 DIAGNOSIS — J84112 Idiopathic pulmonary fibrosis: Secondary | ICD-10-CM | POA: Diagnosis not present

## 2018-02-26 DIAGNOSIS — Z23 Encounter for immunization: Secondary | ICD-10-CM | POA: Diagnosis not present

## 2018-02-26 DIAGNOSIS — Z5181 Encounter for therapeutic drug level monitoring: Secondary | ICD-10-CM

## 2018-02-26 LAB — HEPATIC FUNCTION PANEL
ALT: 13 U/L (ref 0–53)
AST: 24 U/L (ref 0–37)
Albumin: 4.5 g/dL (ref 3.5–5.2)
Alkaline Phosphatase: 54 U/L (ref 39–117)
BILIRUBIN TOTAL: 0.5 mg/dL (ref 0.2–1.2)
Bilirubin, Direct: 0.1 mg/dL (ref 0.0–0.3)
Total Protein: 7 g/dL (ref 6.0–8.3)

## 2018-02-26 NOTE — Patient Instructions (Signed)
ICD-10-CM   1. IPF (idiopathic pulmonary fibrosis) ([place]) J84.112   2. Familial idiopathic pulmonary fibrosis ([place]) J84.112   3. Encounter for therapeutic drug monitoring Z51.81      Clinically stable idiopathic pulmonary fibrosis  Plan -Do simple walk test today -Check liver function test today -Do spirometry with DLCO in January 2020/February 2020 -Continue nintedanib as before -Okay not to take proton pump inhibitor -Appreciate continued interest in the Spray face to inhaler study  -We will contact you as soon as we already but realistically we are looking at January/February 2020 -Consider Prevnar-13 pneumonia vaccine [it appears he only had Pneumovax-23 vaccine] -you can do this today -Appreciate continued participation of the patient support group  Follow-up -Late January/early February 2020 in ILD clinic but after spirometry with DLCO

## 2018-02-26 NOTE — Research (Signed)
Late Entry Correction. This subject consented to the follow protocol, NOT to [id number].   Title: GALACTIC-1 [facility] Study) is a Phase 2b, randomized, double-blind, parallel, placebo-controlled multicenter international study to evaluate evaluate the efficacy and safety of two doses (placebo v 58m v 158m of TD139 administered once a day for 52 weeks as compared to placebo in subjects with Idiopathic Pulmonary Fibrosis. Key Primary End Point: Annual rate of decline in FVC expressed in mL over 52 weeks  Protocol #: GALACTIC-1, Clinical Trials #: [id number]: [email or web address] (CGolden West FinancialDeFrench [place] JeMariana ArnuFindlayLLCollins

## 2018-02-26 NOTE — Progress Notes (Signed)
OV 02/22/2016  Chief Complaint  Patient presents with  . Pulmonary Consult    Pt referred by Dr.Byrum for ILD. Pt states he feels he is still recovering from the lung biopsy. Pt denies cough and CP/tightness.     2nd opinionand transfer of care for ILD/UIP - IPF from DR Byrum. Presents with his wife. They related to Dr. Stanford Breed cardiologist   62 year old male found to have crackles on physical exam and subsequently surgical lung biopsy 02/02/2016 showed UIP as read by pathologist in [place].  [employer] of chest physicians interstitial lung disease questionnaire  Symptoms: He does not cough and is not troubled by shortness of breath except with strenuous exercise. Onset is only "recently" Past medical history: Positive for chronic sinus drainage mild. Recently after surgical lung biopsy was being a few bone unintentional weight loss. Otherwise negative Personal exposure history: He smoked occasional cigarettes having grown up on her tobacco farm starting at 74 including when he was age 53.  Family history: All the male members in his family who smoke developed COPD but also did not smoke to the pulmonary fibrosis especially his father and his paternal uncles. His dad was diagnosed with pulmonary fibrosis at age of 52 in 2001/05/21 and died from progressive hypoxemic respiratory failure after what sounds like an IPF flare in May 21, 2005.  Home exposure history: He is lived in the same house since 1987-05-22. The home does not have a humidifier or sound or hot tub or Jacuzzi. He is not exposed to any birds. 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 Approximately 30 years of work ending 4 years ago used to cut through dry walls and was exposed to concrete dust and drywall dust and possibly asbestos. HE is to  cut through insulation safe. 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 Did not work in a minor quarry a Teacher, music on Agricultural engineer. Denies any exposure to birds of feathers  Autoimmune history: Is negative.  History of exposure to pulmonary toxic drugs: Negative   Lab work - April 2017 and November 2017 extensive autoimmune and vascular this panel is negative  - High resolution CT scan of the chest August 2017: UIP pattern that is classic - personally visualized. There is also 5 mm right lower lobe lung nodule and a 5.9 cm left-sided lower bulla [stable compared to April 2017]   Severity - Pulmonary function test 08/02/2015: Isolated reduction in diffusion capacity 17.33/61%. Otherwise FVC 4 L/93% and normal. Total lung capacity 5.9 L/91% and normal - Walking desaturation test 185 feet 3 laps on room air in the office 02/22/2016: Did not desaturate but gets symptomatic   Emotional state: He is extremely worried about the diagnosis of IPF. This is particularly so because he has a strong family history and his father died within 78 years usually was diagnosed much order at age 30. Patient is worried that he got diagnosed with the same problem and much younger age of 82 and feels that he only will have a few years to live. Multiple times during the visit he was teary-eyed   OV 07/21/2016  Chief Complaint  Patient presents with  . Follow-up  Pt denies change in breathing since last OV. Pt denies cough and SOB and CP/tightness.      Follow-up idiopathic pulmonary fibrosis familial variety  He presents with his wife. Overall he is stable. 4 routine activities of daily living and mowing the yard he does not have any shortness of breath or cough. He did find the mountains recently been quite dyspneic. This is worse than June 2017 with for the diagnosis of IPF. Overall he is tolerating the Ofev just  fine except occasionally he has some GI discomfort.He is questions about research trials, exercise, pulmonary fibrosis foundation and family genetics.he is quite active and diffuse pulmonary rehabilitation is easy for him. He is willing to go to [facility] and work on aerobic strengthening and muscle conditioning  Notice that she's not had pulmonary function testing since May 2017    has a past medical history of Dyspnea; Hyperlipemia; and Pneumonia.   reports that he has never smoked. He has never used smokeless tobacco.  OV 10/25/2016  Chief Complaint  Patient presents with  . Follow-up    Pt here after PFT. Pt states his breathing is unchanged. Pt c/o PND and dry cough. Pt denies f/c/s and CP/tightenss.    Follow-up mild idiopathic pulmonary fibrosis he's on Ofev since  NOv 2017  Here with his wife. Overall he is stable. His pulmonary function tests and CT scan of the chest shows continued stability for 1 year. Symptom-wise also he is stable. His dyspnea when doing mountains. He is kayaking and biking and is able to outpace his wife. During this time he does not check his pulse oximetry. He does not have one. I've advised that he can get one. He is asking about familial issues, research and also prognosis we went over all this. Of note CT scan of the chest shows also he has concomitant emphysema. He prefers to avoid medications but he is interested in taking an inhaler if it means improving his dyspnea. He is active with pulmonary fibrosis foundation patient support groupo. Of note he reports easy bruising in his forearm when he bumps into something. His wife thinks it's been there all along and is unchanged. He is wondering if that this is related to Ofev and asa. We did discuss about the bleeding risk of Ofev and the need to hold this before any procedure. Overall he tolerates Ofev just fine except for occasional diarrhea once a month. This is associated with fiber food    OV   02/26/2017  Chief Complaint  Patient presents with  . Follow-up    PFT done today. Pt still taking OFEV and doing good on it. Denies any complaints with cough, SOB, or CP.    Follow-up idiopathic pulmonary fibrosis on Nintendanib since November 2017  This time his wife is not here with him.  He has now been on nintedanib for a year.  Overall he is tolerating it fine except for mild occasional diarrhea.  He continues to be very active working out in the yard and doing daily walks with a few miles.  He notices only mild dyspnea with this relieved by rest.  He does not feel his health has declined in the last 1 year.  He does have concomitant emphysema on the CT scan but not apparent on the PFTs.  I asked him to try Spiriva last time but it did not help.  He is dissatisfied with occasional albuterol use.  He did have pulmonary function test today which is documented  below.  The FVC is stable but there is a change in the DLCO but he is not feeling it.  He is on IPF registry by [facility];  he is due for a study visit today.  He is interested in further studies especially [place].  We discussed this in detail brief.  He has had genetics testing he got the result.  He is worried insurance will deny this.  He is participating in the pulmonary fibrosis foundation patient support group.  He is asking details about the March 17, 2017 local [place]   OV 05/22/2017  Chief Complaint  Patient presents with  . Follow-up    PFT done today.  Pt stated he had the flu 05/05/17 and was not able to eat on 05/06/17 so he missed two OFEV doses. States he has had diarrhea a couple times since then but is now doing fine with help from immodium.  States his breathing has been doing good.      Follow-up idiopathic pulmonary fibrosis on Nintendanib since November 7753   62 year old male with idiopathic pulmonary fibrosis.  He is on stable dose of nintedanib.  He tells me earlier this month he had stomach flu and that he  suddenly had subjective sense of fever associated with diarrhea and vomiting.  This resolved in 2 days.  Since then he is lost a couple of pounds of weight.  He is worried about tolerating the nintedanib.  In addition primary function test shows a 3% decline in FVC and 2 years and a 12% decline in DLCO in 2 years.  He is now wondering if the drug is effective.  Overall other than the recent illness and occasional mild diarrhea he is tolerated off of just fine.  But he is now worried about the tolerance.  He has been advised by [facility] nursing support team about taking protein with nintedanib to offset side effects and he has questions about this.   OV 08/14/2017  Chief Complaint  Patient presents with  . Follow-up    PFT done today. Pt states he has been doing good since last visit and denies any complaints.     Follow-up idiopathic pulmonary fibrosis surgical lung biopsy diagnosis February 02, 2016 on Nintendanib since November 2017  Mr. Hyland continues to do well.  At the time of last visit there was concern about weight loss and also progression in lung function.  However at that time he had a respiratory infection.  Today he comes back and tells me that he has gained 1 pound of weight.  His respiratory symptoms have resolved he is back to being at a stable mild exertional dyspnea.  He is extremely active.  Concordant with his symptom improvement his FVC has improved and his DLCO is stable.  Overall his pulmonary function test is stable since November 2018 at least with a DLCO although the DLCO has progressed since 2017.  His FVC has been stable all along since May 2017.  He is not interested in the [place] study because of the oral pill and can make his diarrhea potentially worse if he gets randomized to the drug.  He is interested in an upcoming inhaler trial of IPF.  He is active in the support group and is asking about the next meeting.  He is not on oxygen.        Ct Chest High  Resolution  Result Date: 10/25/2016 CLINICAL DATA:  Interstitial lung disease, former smoker, cough. Lung biopsy November 2017. EXAM:  CT CHEST WITHOUT CONTRAST TECHNIQUE: Multidetector CT imaging of the chest was performed following the standard protocol without intravenous contrast. High resolution imaging of the lungs, as well as inspiratory and expiratory imaging, was performed. COMPARISON:  11/23/2015 and 07/08/2015. FINDINGS: Cardiovascular: Vascular structures are unremarkable. Heart size normal. No pericardial effusion. Mediastinum/Nodes: Mediastinal lymph nodes are not enlarged by CT size criteria. Hilar regions are difficult to evaluate without IV contrast. No axillary adenopathy. Esophagus is grossly unremarkable. Lungs/Pleura: Centrilobular and paraseptal emphysema with a large bullous lesion in the left lower lobe. Post biopsy changes in the right upper, right middle and right lower lobes. Superimposed basilar predominant subpleural reticulation, ground-glass and traction bronchiectasis/bronchiolectasis, likely stable from baseline examination of 07/08/2015 when differences in slice collimation are considered. There may be scattered honeycombing. Mild subpleural nodularity along the right major fissure is unchanged from 07/08/2015, indicative of subpleural lymph nodes. No pleural fluid. Airway is unremarkable. Upper Abdomen: Visualized portions of the liver and gallbladder are unremarkable. Mild nodular thickening of both adrenal glands. Visualized portion of the right kidney is unremarkable. 2.5 cm low-attenuation lesion in the left kidney is likely a cyst. Visualized portions of the spleen, pancreas, stomach and bowel are grossly unremarkable. No upper abdominal adenopathy. Musculoskeletal: No worrisome lytic or sclerotic lesions. Degenerative changes in the spine. IMPRESSION: 1. Pulmonary parenchymal pattern of fibrosis is likely stable from 07/08/2015 when differences in slice collimation are  considered. Findings are in keeping with the pathologic diagnosis of usual interstitial pneumonitis. 2.  Emphysema (ICD10-J43.9). Electronically Signed   By: Lorin Picket M.D.   On: 10/25/2016 08:23   OV 02/26/2018  Subjective:  Patient ID: Tamela Gammon, male , DOB: Oct 03, 1955 , age 40 y.o. , MRN: [id number] , ADDRESS: [place] [address]   02/26/2018 -   Chief Complaint  Patient presents with  . Follow-up    breathing well     HPI Shaun [name] 62 y.o. -follow-up familial IPF.  Last seen in May 2019.  After that in July 2019 he is screened for the electric face to inhaler study but he screen failed because he is standard of care CT scan of the chest from July 2018 was not per sponsored protocol or 2018 ATS protocol.  At this point in time he continues on nintedanib.  He is not taking proton pump inhibitor and wants to know if that is okay.  He feels he is stable.  There are no new issues.  The main thing is with nintedanib once a month he will have sporadic diarrhea.  He says he has been unable to control this but from a respiratory standpoint he feels he is stable.  There are no new medical issues.  He is up-to-date with his vaccines although immunization record review shows that he could benefit from Prevnar 13 which she has not had.  He continues to be actively engaged in the patient support group.     Results for OLE, LAFON (MRN [id number]) as of 02/26/2017 09:37  Ref. Range 08/02/2015 10:41 10/25/2016 08:44 02/26/2017 08:52 05/22/2017  08/14/2017   FVC-Pre Latest Units: L 4.00 4.16 4.04 3.91:92% 4.11  FVC-%Pred-Pre Latest Units: % 93 98 95 92 97%   Results for SENCERE, SYMONETTE (MRN [id number]) as of 02/26/2017 09:37  Ref. Range 08/02/2015 10:41 10/25/2016 08:44 02/26/2017 08:52  08/14/2017  10/19/17  DLCO unc Latest Units: ml/min/mmHg 17.33 17.25 14.23 15.14/53% 14.2 13.93  DLCO unc % pred Latest Units: % 61 60 50  53% 50% 49%    Simple office walk -  250 feet x 3 laps 02/26/2018   O2 used Room air  Number laps completed 3  Comments about pace Good brisk  Resting Pulse Ox/HR 100% and 74/min  Final Pulse Ox/HR 96% and 96/min  Desaturated </= 88% no  Desaturated <= 3% points yes  Got Tachycardic >/= 90/min yes  Symptoms at end of test No dyspnea  Miscellaneous comments x      ROS - per HPI     has a past medical history of Dyspnea, Family history of colon cancer, Family history of prostate cancer, Family history of pulmonary fibrosis, Hyperlipemia, and Pneumonia.   reports that he has never smoked. He has never used smokeless tobacco.  Past Surgical History:  Procedure Laterality Date  . COLONOSCOPY WITH PROPOFOL N/A 09/21/2014   Procedure: COLONOSCOPY WITH PROPOFOL;  Surgeon: Garlan Fair, MD;  Location: [facility];  Service: Endoscopy;  Laterality: N/A;  . FOREARM SURGERY     excision of birth mark"child"  . HERNIA REPAIR     RIH-open  . KNEE ARTHROTOMY Left    bursa excision  . LUNG BIOPSY Right 02/02/2016   Procedure: LUNG BIOPSY;  Surgeon: [name], MD;  Location: [facility];  Service: Thoracic;  Laterality: Right;  . NASAL SEPTUM SURGERY    . VIDEO ASSISTED THORACOSCOPY Right 02/02/2016   Procedure: VIDEO ASSISTED THORACOSCOPY;  Surgeon: [name], MD;  Location: [facility];  Service: Thoracic;  Laterality: Right;  . WRIST GANGLION EXCISION Left     Allergies  Allergen Reactions  . Penicillins Other (See Comments)    UNSPECIFIED REACTION OF CHILDHOOD Has patient had a PCN reaction causing immediate rash, facial/tongue/throat swelling, SOB or lightheadedness with hypotension:unsure Has patient had a PCN reaction causing severe rash involving mucus membranes or skin necrosis:unsure Has patient had a PCN reaction that required hospitalization:No Has patient had a PCN reaction occurring within the last 10 years:No If all of the above answers are "NO", then may proceed with Cephalosporin use.      Immunization History  Administered Date(s) Administered  . Influenza,inj,Quad PF,6+ Mos 12/09/2016, 11/26/2017  . Influenza-Unspecified 11/02/2015, 12/09/2016  . Pneumococcal Polysaccharide-23 02/05/2016  . Zoster 11/02/2015  . Zoster Recombinat (Shingrix) 12/09/2016, 02/10/2017    Family History  Problem Relation Age of Onset  . Lung cancer Mother   . Colon cancer Mother 18  . Pulmonary fibrosis Father        dx in his 69s  . Prostate cancer Brother 75  . COPD Paternal Grandfather   . Other Paternal Uncle        possible lung dx; died around age 57  . Other Cousin        possible lung dx, died in her 54s; paternal first cousin  . Healthy Son      Current Outpatient Medications:  .  albuterol (PROVENTIL HFA;VENTOLIN HFA) 108 (90 Base) MCG/ACT inhaler, Inhale 2 puffs into the lungs every 6 (six) hours as needed for wheezing or shortness of breath., Disp: 1 Inhaler, Rfl: 5 .  aspirin EC 81 MG tablet, Take 81 mg by mouth daily., Disp: , Rfl:  .  Multiple Vitamin (MULTIVITAMIN WITH MINERALS) TABS tablet, Take 1 tablet by mouth daily., Disp: , Rfl:  .  OFEV 150 MG CAPS, TAKE 1 CAPSULE BY MOUTH TWICE DAILY EVERY 12 HOURS APART WITH FOOD, Disp: 60 capsule, Rfl: 0 .  simvastatin (ZOCOR) 10 MG tablet, Take 10 mg  by mouth daily at 6 PM. , Disp: , Rfl: 0 .  Chlorpheniramine-Pseudoeph 4-60 MG TABS, Take 1 tablet by mouth 2 (two) times daily as needed (for allergies.)., Disp: , Rfl:  .  pantoprazole (PROTONIX) 40 MG tablet, TAKE 1 TABLET BY MOUTH ONCE DAILY (Patient not taking: Reported on 02/26/2018), Disp: 30 tablet, Rfl: 3      Objective:   Vitals:   02/26/18 1533  BP: 112/76  Pulse: 68  SpO2: 96%  Weight: 144 lb (65.3 kg)  Height: 5' 7" (1.702 m)    Estimated body mass index is 22.55 kg/m as calculated from the following:   Height as of this encounter: 5' 7" (1.702 m).   Weight as of this encounter: 144 lb (65.3 kg).  _0 @  Filed Weights   02/26/18 1533   Weight: 144 lb (65.3 kg)     Physical Exam  General Appearance:    Alert, cooperative, no distress, appears stated age - no , Deconditioned looking - no , OBESE  - no, Sitting on Wheelchair -  no  Head:    Normocephalic, without obvious abnormality, atraumatic  Eyes:    PERRL, conjunctiva/corneas clear,  Ears:    Normal TM's and external ear canals, both ears  Nose:   Nares normal, septum midline, mucosa normal, no drainage    or sinus tenderness. OXYGEN ON  - no . Patient is @ ra   Throat:   Lips, mucosa, and tongue normal; teeth and gums normal. Cyanosis on lips - no  Neck:   Supple, symmetrical, trachea midline, no adenopathy;    thyroid:  no enlargement/tenderness/nodules; no carotid   bruit or JVD  Back:     Symmetric, no curvature, ROM normal, no CVA tenderness  Lungs:     Distress - no , Wheeze no, Barrell Chest - no, Purse lip breathing - no, Crackles - yds lung base   Chest Wall:    No tenderness or deformity.    Heart:    Regular rate and rhythm, S1 and S2 normal, no rub   or gallop, Murmur - no  Breast Exam:    NOT DONE  Abdomen:     Soft, non-tender, bowel sounds active all four quadrants,    no masses, no organomegaly. Visceral obesity - no  Genitalia:   NOT DONE  Rectal:   NOT DONE  Extremities:   Extremities - normal, Has Cane - no, Clubbing - no, Edema - no  Pulses:   2+ and symmetric all extremities  Skin:   Stigmata of Connective Tissue Disease - no  Lymph nodes:   Cervical, supraclavicular, and axillary nodes normal  Psychiatric:  Neurologic:   Pleasant - yes, Anxious - no, Flat affect - no  CAm-ICU - neg, Alert and Oriented x 3 - yes, Moves all 4s - yes, Speech - normal, Cognition - intact           Assessment:       ICD-10-CM   1. IPF (idiopathic pulmonary fibrosis) ([place]) J84.112   2. Familial idiopathic pulmonary fibrosis ([place]) J84.112   3. Encounter for therapeutic drug monitoring Z51.81        Plan:     Patient Instructions     ICD-10-CM    1. IPF (idiopathic pulmonary fibrosis) ([place]) J84.112   2. Familial idiopathic pulmonary fibrosis ([place]) J84.112   3. Encounter for therapeutic drug monitoring Z51.81      Clinically stable idiopathic pulmonary fibrosis  Plan -Do simple walk test today -  Check liver function test today -Do spirometry with DLCO in January 2020/February 2020 -Continue nintedanib as before -Okay not to take proton pump inhibitor -Appreciate continued interest in the [place] face to inhaler study  -We will contact you as soon as we already but realistically we are looking at January/February 2020 -Consider Prevnar-13 pneumonia vaccine [it appears he only had Pneumovax-23 vaccine] -you can do this today -Appreciate continued participation of the patient support group  Follow-up -Late January/early February 2020 in ILD clinic but after spirometry with [facility]     SIGNATURE    Dr. Brand Males, M.D., F.C.C.P,  Pulmonary and Critical Care Medicine Staff Physician, [place] Director - Interstitial Lung Disease  Program  [facility] [place], [place], [address]  Pager: [phone], If no answer or between  15:00h - 7:00h: call [phone] Telephone: [phone]  4:23 PM 02/26/2018

## 2018-03-01 ENCOUNTER — Telehealth: Payer: Self-pay | Admitting: Internal Medicine

## 2018-03-01 NOTE — Telephone Encounter (Signed)
Spoke with pt, advised him of results. Pt understood and nothing further is needed.    Notes recorded by Brand Males, MD on 02/27/2018 at 8:19 AM EST lft normal

## 2018-03-14 ENCOUNTER — Other Ambulatory Visit: Payer: Self-pay | Admitting: Internal Medicine

## 2018-04-18 ENCOUNTER — Other Ambulatory Visit: Payer: Self-pay | Admitting: Internal Medicine

## 2018-04-18 ENCOUNTER — Telehealth: Payer: Self-pay | Admitting: Internal Medicine

## 2018-04-18 NOTE — Telephone Encounter (Signed)
We had already received a refill request for pt's OFEV and I sent it in for pt 04/18/2018. [facility] to check to see if they did receive the request. Spoke with Regilyn to see if they received the refill I sent in for pt. Per Regilyn, they did receive it and will get the refill taken care of for pt. Nothing further needed.

## 2018-04-22 ENCOUNTER — Telehealth: Payer: Self-pay | Admitting: Internal Medicine

## 2018-04-22 NOTE — Telephone Encounter (Signed)
PA request received from [facility] pharmacy Drug requested: Ofev 154m CMM Key: [id number] PA request has been approved through 04/21/2019.  Forwarding to ERaquel Sarnato make aware.

## 2018-04-26 ENCOUNTER — Other Ambulatory Visit: Payer: Self-pay | Admitting: Internal Medicine

## 2018-04-26 DIAGNOSIS — J84112 Idiopathic pulmonary fibrosis: Secondary | ICD-10-CM

## 2018-04-29 ENCOUNTER — Ambulatory Visit (INDEPENDENT_AMBULATORY_CARE_PROVIDER_SITE_OTHER): Payer: BLUE CROSS/BLUE SHIELD | Admitting: Internal Medicine

## 2018-04-29 ENCOUNTER — Ambulatory Visit: Payer: BLUE CROSS/BLUE SHIELD | Admitting: Internal Medicine

## 2018-04-29 ENCOUNTER — Encounter: Payer: Self-pay | Admitting: Internal Medicine

## 2018-04-29 VITALS — BP 128/64 | HR 78 | Ht 67.0 in | Wt 145.8 lb

## 2018-04-29 DIAGNOSIS — J84112 Idiopathic pulmonary fibrosis: Secondary | ICD-10-CM | POA: Diagnosis not present

## 2018-04-29 LAB — PULMONARY FUNCTION TEST
DL/VA % pred: 75 %
DL/VA: 3.32 ml/min/mmHg/L
DLCO unc % pred: 52 %
DLCO unc: 14.84 ml/min/mmHg
FEF 25-75 Pre: 4.17 L/sec
FEF2575-%Pred-Pre: 160 %
FEV1-%PRED-PRE: 112 %
FEV1-PRE: 3.55 L
FEV1FVC-%Pred-Pre: 113 %
FEV6-%Pred-Pre: 104 %
FEV6-Pre: 4.16 L
FEV6FVC-%PRED-PRE: 105 %
FVC-%PRED-PRE: 98 %
FVC-Pre: 4.16 L
PRE FEV1/FVC RATIO: 85 %
Pre FEV6/FVC Ratio: 100 %

## 2018-04-29 NOTE — Patient Instructions (Addendum)
ICD-10-CM   1. IPF (idiopathic pulmonary fibrosis) ([place]) J84.112   2. Familial idiopathic pulmonary fibrosis ([facility]) J84.112     Stable disease  Plan - no need for LFT because last liver check was in November 2019 -Continue nintedanib 150 mg twice daily as before -You can try Imodium on a scheduled basis once a week to see if you can get rid of the occasional mild, diarrhea that happened suddenly -I have referred you to research for that [place] inhaler study  -Hopefully we can get you enrolled in the next 2-4 weeks  Follow-up  -6 months do spirometry and DLCO -Return to regular clinic in 6 months  -We will probably cancel this visit if you enrolled in a IPF research study

## 2018-04-29 NOTE — Progress Notes (Signed)
OV 02/22/2016  Chief Complaint  Patient presents with  . Pulmonary Consult    Pt referred by Dr.Byrum for ILD. Pt states he feels he is still recovering from the lung biopsy. Pt denies cough and CP/tightness.     2nd opinionand transfer of care for ILD/UIP - IPF from DR Byrum. Presents with his wife. They related to Dr. Stanford Breed cardiologist   63 year old male found to have crackles on physical exam and subsequently surgical lung biopsy 02/02/2016 showed UIP as read by pathologist in [place].  [employer] of chest physicians interstitial lung disease questionnaire  Symptoms: He does not cough and is not troubled by shortness of breath except with strenuous exercise. Onset is only "recently" Past medical history: Positive for chronic sinus drainage mild. Recently after surgical lung biopsy was being a few bone unintentional weight loss. Otherwise negative Personal exposure history: He smoked occasional cigarettes having grown up on her tobacco farm starting at 50 including when he was age 25.  Family history: All the male members in his family who smoke developed COPD but also did not smoke to the pulmonary fibrosis especially his father and his paternal uncles. His dad was diagnosed with pulmonary fibrosis at age of 7 in 2001/05/11 and died from progressive hypoxemic respiratory failure after what sounds like an IPF flare in 11-May-2005.  Home exposure history: He is lived in the same house since 05-12-87. The home does not have a humidifier or sound or hot tub or Jacuzzi. He is not exposed to any birds. 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 Approximately 30 years of work ending 4 years ago used to cut through dry walls and was exposed to concrete dust and drywall dust and possibly asbestos. HE is to  cut through insulation safe. 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 Did not work in a minor quarry a Teacher, music on Agricultural engineer. Denies any exposure to birds of feathers  Autoimmune history: Is negative.  History of exposure to pulmonary toxic drugs: Negative   Lab work - April 2017 and November 2017 extensive autoimmune and vascular this panel is negative  - High resolution CT scan of the chest August 2017: UIP pattern that is classic - personally visualized. There is also 5 mm right lower lobe lung nodule and a 5.9 cm left-sided lower bulla [stable compared to April 2017]   Severity - Pulmonary function test 08/02/2015: Isolated reduction in diffusion capacity 17.33/61%. Otherwise FVC 4 L/93% and normal. Total lung capacity 5.9 L/91% and normal - Walking desaturation test 185 feet 3 laps on room air in the office 02/22/2016: Did not desaturate but gets symptomatic   Emotional state: He is extremely worried about the diagnosis of IPF. This is particularly so because he has a strong family history and his father died within 8 years usually was diagnosed much order at age 88. Patient is worried that he got diagnosed with the same problem and much younger age of 56 and feels that he only will have a few years to live. Multiple times during the visit he was teary-eyed   OV 07/21/2016  Chief Complaint  Patient presents with  . Follow-up  Pt denies change in breathing since last OV. Pt denies cough and SOB and CP/tightness.      Follow-up idiopathic pulmonary fibrosis familial variety  He presents with his wife. Overall he is stable. 4 routine activities of daily living and mowing the yard he does not have any shortness of breath or cough. He did find the mountains recently been quite dyspneic. This is worse than June 2017 with for the diagnosis of IPF. Overall he is tolerating the Ofev just  fine except occasionally he has some GI discomfort.He is questions about research trials, exercise, pulmonary fibrosis foundation and family genetics.he is quite active and diffuse pulmonary rehabilitation is easy for him. He is willing to go to [facility] and work on aerobic strengthening and muscle conditioning  Notice that she's not had pulmonary function testing since May 2017    has a past medical history of Dyspnea; Hyperlipemia; and Pneumonia.   reports that he has never smoked. He has never used smokeless tobacco.  OV 10/25/2016  Chief Complaint  Patient presents with  . Follow-up    Pt here after PFT. Pt states his breathing is unchanged. Pt c/o PND and dry cough. Pt denies f/c/s and CP/tightenss.    Follow-up mild idiopathic pulmonary fibrosis he's on Ofev since  NOv 2017  Here with his wife. Overall he is stable. His pulmonary function tests and CT scan of the chest shows continued stability for 1 year. Symptom-wise also he is stable. His dyspnea when doing mountains. He is kayaking and biking and is able to outpace his wife. During this time he does not check his pulse oximetry. He does not have one. I've advised that he can get one. He is asking about familial issues, research and also prognosis we went over all this. Of note CT scan of the chest shows also he has concomitant emphysema. He prefers to avoid medications but he is interested in taking an inhaler if it means improving his dyspnea. He is active with pulmonary fibrosis foundation patient support groupo. Of note he reports easy bruising in his forearm when he bumps into something. His wife thinks it's been there all along and is unchanged. He is wondering if that this is related to Ofev and asa. We did discuss about the bleeding risk of Ofev and the need to hold this before any procedure. Overall he tolerates Ofev just fine except for occasional diarrhea once a month. This is associated with fiber food    OV   02/26/2017  Chief Complaint  Patient presents with  . Follow-up    PFT done today. Pt still taking OFEV and doing good on it. Denies any complaints with cough, SOB, or CP.    Follow-up idiopathic pulmonary fibrosis on Nintendanib since November 2017  This time his wife is not here with him.  He has now been on nintedanib for a year.  Overall he is tolerating it fine except for mild occasional diarrhea.  He continues to be very active working out in the yard and doing daily walks with a few miles.  He notices only mild dyspnea with this relieved by rest.  He does not feel his health has declined in the last 1 year.  He does have concomitant emphysema on the CT scan but not apparent on the PFTs.  I asked him to try Spiriva last time but it did not help.  He is dissatisfied with occasional albuterol use.  He did have pulmonary function test today which is documented  below.  The FVC is stable but there is a change in the DLCO but he is not feeling it.  He is on IPF registry by [facility];  he is due for a study visit today.  He is interested in further studies especially [place].  We discussed this in detail brief.  He has had genetics testing he got the result.  He is worried insurance will deny this.  He is participating in the pulmonary fibrosis foundation patient support group.  He is asking details about the March 17, 2017 local [place]   OV 05/22/2017  Chief Complaint  Patient presents with  . Follow-up    PFT done today.  Pt stated he had the flu 05/05/17 and was not able to eat on 05/06/17 so he missed two OFEV doses. States he has had diarrhea a couple times since then but is now doing fine with help from immodium.  States his breathing has been doing good.      Follow-up idiopathic pulmonary fibrosis on Nintendanib since November 7753   63 year old male with idiopathic pulmonary fibrosis.  He is on stable dose of nintedanib.  He tells me earlier this month he had stomach flu and that he  suddenly had subjective sense of fever associated with diarrhea and vomiting.  This resolved in 2 days.  Since then he is lost a couple of pounds of weight.  He is worried about tolerating the nintedanib.  In addition primary function test shows a 3% decline in FVC and 2 years and a 12% decline in DLCO in 2 years.  He is now wondering if the drug is effective.  Overall other than the recent illness and occasional mild diarrhea he is tolerated off of just fine.  But he is now worried about the tolerance.  He has been advised by [facility] nursing support team about taking protein with nintedanib to offset side effects and he has questions about this.   OV 08/14/2017  Chief Complaint  Patient presents with  . Follow-up    PFT done today. Pt states he has been doing good since last visit and denies any complaints.     Follow-up idiopathic pulmonary fibrosis surgical lung biopsy diagnosis February 02, 2016 on Nintendanib since November 2017  Mr. Hyland continues to do well.  At the time of last visit there was concern about weight loss and also progression in lung function.  However at that time he had a respiratory infection.  Today he comes back and tells me that he has gained 1 pound of weight.  His respiratory symptoms have resolved he is back to being at a stable mild exertional dyspnea.  He is extremely active.  Concordant with his symptom improvement his FVC has improved and his DLCO is stable.  Overall his pulmonary function test is stable since November 2018 at least with a DLCO although the DLCO has progressed since 2017.  His FVC has been stable all along since May 2017.  He is not interested in the [place] study because of the oral pill and can make his diarrhea potentially worse if he gets randomized to the drug.  He is interested in an upcoming inhaler trial of IPF.  He is active in the support group and is asking about the next meeting.  He is not on oxygen.        Ct Chest High  Resolution  Result Date: 10/25/2016 CLINICAL DATA:  Interstitial lung disease, former smoker, cough. Lung biopsy November 2017. EXAM:  CT CHEST WITHOUT CONTRAST TECHNIQUE: Multidetector CT imaging of the chest was performed following the standard protocol without intravenous contrast. High resolution imaging of the lungs, as well as inspiratory and expiratory imaging, was performed. COMPARISON:  11/23/2015 and 07/08/2015. FINDINGS: Cardiovascular: Vascular structures are unremarkable. Heart size normal. No pericardial effusion. Mediastinum/Nodes: Mediastinal lymph nodes are not enlarged by CT size criteria. Hilar regions are difficult to evaluate without IV contrast. No axillary adenopathy. Esophagus is grossly unremarkable. Lungs/Pleura: Centrilobular and paraseptal emphysema with a large bullous lesion in the left lower lobe. Post biopsy changes in the right upper, right middle and right lower lobes. Superimposed basilar predominant subpleural reticulation, ground-glass and traction bronchiectasis/bronchiolectasis, likely stable from baseline examination of 07/08/2015 when differences in slice collimation are considered. There may be scattered honeycombing. Mild subpleural nodularity along the right major fissure is unchanged from 07/08/2015, indicative of subpleural lymph nodes. No pleural fluid. Airway is unremarkable. Upper Abdomen: Visualized portions of the liver and gallbladder are unremarkable. Mild nodular thickening of both adrenal glands. Visualized portion of the right kidney is unremarkable. 2.5 cm low-attenuation lesion in the left kidney is likely a cyst. Visualized portions of the spleen, pancreas, stomach and bowel are grossly unremarkable. No upper abdominal adenopathy. Musculoskeletal: No worrisome lytic or sclerotic lesions. Degenerative changes in the spine. IMPRESSION: 1. Pulmonary parenchymal pattern of fibrosis is likely stable from 07/08/2015 when differences in slice collimation are  considered. Findings are in keeping with the pathologic diagnosis of usual interstitial pneumonitis. 2.  Emphysema (ICD10-J43.9). Electronically Signed   By: Lorin Picket M.D.   On: 10/25/2016 08:23   OV 02/26/2018  Subjective:  Patient ID: Shaun [name], male , DOB: 1955/08/01 , age 65 y.o. , MRN: [id number] , ADDRESS: [place] [address]   02/26/2018 -   Chief Complaint  Patient presents with  . Follow-up    breathing well     HPI Shaun [name] 63 y.o. -follow-up familial IPF.  Last seen in May 2019.  After that in July 2019 he is screened for the electric face to inhaler study but he screen failed because he is standard of care CT scan of the chest from July 2018 was not per sponsored protocol or 2018 ATS protocol.  At this point in time he continues on nintedanib.  He is not taking proton pump inhibitor and wants to know if that is okay.  He feels he is stable.  There are no new issues.  The main thing is with nintedanib once a month he will have sporadic diarrhea.  He says he has been unable to control this but from a respiratory standpoint he feels he is stable.  There are no new medical issues.  He is up-to-date with his vaccines although immunization record review shows that he could benefit from Prevnar 13 which she has not had.  He continues to be actively engaged in the patient support group.   ROS - per HPI    OV 04/29/2018  Subjective:  Patient ID: Shaun [name], male , DOB: 1956-01-01 , age 68 y.o. , MRN: [id number] , ADDRESS: [place] [address]   04/29/2018 -   Chief Complaint  Patient presents with  . Follow-up    PFT performed today.  Pt states he is about the same since last visit.      HPI Shaun [name] 63 y.o. -returns for IPF follow-up.  He is on nintedanib.  He reports continued stability.  He  still has ongoing occasional issue with diarrhea that happened suddenly every few weeks.  It is so random.  It  bothers him and does affect his quality of life.  He takes Imodium in response.  We did discuss about taking Imodium proactively once a week.  He is now to give this a try.  He continues to be interested in pulmonary reserve study particularly face to inhaler study sponsored by [employer].  Our research department is getting ready and probably should be able to screen him in the next few to several weeks.  His last pulmonary function test was today and shows continued stability.  Last liver function test was November 2019 and was normal.  His walk test today is baseline       Simple office walk - 250 feet x 3 laps 02/26/2018  04/29/2018   O2 used Room air Room air  Number laps completed 3 3  Comments about pace Good brisk   Resting Pulse Ox/HR 100% and 74/min 100% and 86/min      Final Pulse Ox/HR 96% and 96/min 97% and 93/min  Desaturated </= 88% no no  Desaturated <= 3% points yes Yes, 3  Got Tachycardic >/= 90/min yes yes  Symptoms at end of test No dyspnea No dyspnea  Miscellaneous comments x x    Results for Shaun [name], Shaun [name] (MRN [id number]) as of 04/29/2018 11:31  Ref. Range 08/02/2015 10:41 10/25/2016 08:44 02/26/2017 09:06 05/22/2017 08:59 08/14/2017 12:41 10/19/2017 11:24 04/29/2018 10:47  FVC-Pre Latest Units: L 4.00 4.16 4.04 3.91 4.11  4.16  FVC-%Pred-Pre Latest Units: % 93 98 95 92 97  98   Results for Shaun [name], Shaun [name] (MRN [id number]) as of 04/29/2018 11:31  Ref. Range 08/02/2015 10:41 10/25/2016 08:44 02/26/2017 09:06 05/22/2017 08:59 08/14/2017 12:41 10/19/2017 11:24 04/29/2018 10:47  DLCO unc Latest Units: ml/min/mmHg 17.33 17.25 14.23 15.14 14.42 13.93 14.84  DLCO unc % pred Latest Units: % 61 60 50 53 50 49 52   ROS - per HPI     has a past medical history of Dyspnea, Family history of colon cancer, Family history of prostate cancer, Family history of pulmonary fibrosis, Hyperlipemia, and Pneumonia.   reports that he has never smoked. He has never used smokeless  tobacco.  Past Surgical History:  Procedure Laterality Date  . COLONOSCOPY WITH PROPOFOL N/A 09/21/2014   Procedure: COLONOSCOPY WITH PROPOFOL;  Surgeon: Garlan Fair, MD;  Location: [facility];  Service: Endoscopy;  Laterality: N/A;  . FOREARM SURGERY     excision of birth mark"child"  . HERNIA REPAIR     RIH-open  . KNEE ARTHROTOMY Left    bursa excision  . LUNG BIOPSY Right 02/02/2016   Procedure: LUNG BIOPSY;  Surgeon: [name], MD;  Location: [place];  Service: Thoracic;  Laterality: Right;  . NASAL SEPTUM SURGERY    . VIDEO ASSISTED THORACOSCOPY Right 02/02/2016   Procedure: VIDEO ASSISTED THORACOSCOPY;  Surgeon: [name], MD;  Location: [place];  Service: Thoracic;  Laterality: Right;  . WRIST GANGLION EXCISION Left     Allergies  Allergen Reactions  . Penicillins Other (See Comments)    UNSPECIFIED REACTION OF CHILDHOOD Has patient had a PCN reaction causing immediate rash, facial/tongue/throat swelling, SOB or lightheadedness with hypotension:unsure Has patient had a PCN reaction causing severe rash involving mucus membranes or skin necrosis:unsure Has patient had a PCN reaction that required hospitalization:No Has patient had a PCN reaction occurring within the last 10 years:No If  all of the above answers are "NO", then may proceed with Cephalosporin use.     Immunization History  Administered Date(s) Administered  . Influenza,inj,Quad PF,6+ Mos 12/09/2016, 11/26/2017  . Influenza-Unspecified 11/02/2015, 12/09/2016  . Pneumococcal Conjugate-13 02/26/2018  . Pneumococcal Polysaccharide-23 02/05/2016  . Zoster 11/02/2015  . Zoster Recombinat (Shingrix) 12/09/2016, 02/10/2017    Family History  Problem Relation Age of Onset  . Lung cancer Mother   . Colon cancer Mother 38  . Pulmonary fibrosis Father        dx in his 22s  . Prostate cancer Brother 21  . COPD Paternal Grandfather   . Other Paternal Uncle        possible lung dx; died  around age 69  . Other Cousin        possible lung dx, died in her 2s; paternal first cousin  . Healthy Son      Current Outpatient Medications:  .  albuterol (PROVENTIL HFA;VENTOLIN HFA) 108 (90 Base) MCG/ACT inhaler, Inhale 2 puffs into the lungs every 6 (six) hours as needed for wheezing or shortness of breath., Disp: 1 Inhaler, Rfl: 5 .  aspirin EC 81 MG tablet, Take 81 mg by mouth daily., Disp: , Rfl:  .  Chlorpheniramine-Pseudoeph 4-60 MG TABS, Take 1 tablet by mouth 2 (two) times daily as needed (for allergies.)., Disp: , Rfl:  .  Multiple Vitamin (MULTIVITAMIN WITH MINERALS) TABS tablet, Take 1 tablet by mouth daily., Disp: , Rfl:  .  OFEV 150 MG CAPS, TAKE 1 CAPSULE BY MOUTH TWICE DAILY 12 HOURS APART WITH FOOD, Disp: 60 capsule, Rfl: 11 .  pantoprazole (PROTONIX) 40 MG tablet, TAKE 1 TABLET BY MOUTH ONCE DAILY, Disp: 30 tablet, Rfl: 3 .  simvastatin (ZOCOR) 10 MG tablet, Take 10 mg by mouth daily at 6 PM. , Disp: , Rfl: 0      Objective:   Vitals:   04/29/18 1145  BP: 128/64  Pulse: 78  SpO2: 96%  Weight: 145 lb 12.8 oz (66.1 kg)  Height: _0  (1.702 m)    Estimated body mass index is 22.84 kg/m as calculated from the following:   Height as of this encounter: _1  (1.702 m).   Weight as of this encounter: 145 lb 12.8 oz (66.1 kg).  _2 @  [employer]   04/29/18 1145  Weight: 145 lb 12.8 oz (66.1 kg)     Physical Exam  General Appearance:    Alert, cooperative, no distress, appears stated age - yes , Deconditioned looking - no , OBESE  - no, Sitting on Wheelchair -  no  Head:    Normocephalic, without obvious abnormality, atraumatic  Eyes:    PERRL, conjunctiva/corneas clear,  Ears:    Normal TM's and external ear canals, both ears  Nose:   Nares normal, septum midline, mucosa normal, no drainage    or sinus tenderness. OXYGEN ON  - no . Patient is @ ra   Throat:   Lips, mucosa, and tongue normal; teeth and gums normal. Cyanosis on lips - no   Neck:   Supple, symmetrical, trachea midline, no adenopathy;    thyroid:  no enlargement/tenderness/nodules; no carotid   bruit or JVD  Back:     Symmetric, no curvature, ROM normal, no CVA tenderness  Lungs:     Distress - no , Wheeze no, Barrell Chest - no, Purse lip breathing - no, Crackles -classic Velcro crackles in the lung base  Chest Wall:    No tenderness  or deformity.    Heart:    Regular rate and rhythm, S1 and S2 normal, no rub   or gallop, Murmur - no  Breast Exam:    NOT DONE  Abdomen:     Soft, non-tender, bowel sounds active all four quadrants,    no masses, no organomegaly. Visceral obesity - no  Genitalia:   NOT DONE  Rectal:   NOT DONE  Extremities:   Extremities - normal, Has Cane - no, Clubbing - n, Edema - no  Pulses:   2+ and symmetric all extremities  Skin:   Stigmata of Connective Tissue Disease - no  Lymph nodes:   Cervical, supraclavicular, and axillary nodes normal  Psychiatric:  Neurologic:   Pleasant - yes, Anxious - no, Flat affect - no  CAm-ICU - neg, Alert and Oriented x 3 - yes, Moves all 4s - yes, Speech - normal, Cognition - intact           Assessment:       ICD-10-CM   1. IPF (idiopathic pulmonary fibrosis) (HCC) K95.747 Pulmonary function test  2. Familial idiopathic pulmonary fibrosis ([place]) B40.370        Plan:     Patient Instructions     ICD-10-CM   1. IPF (idiopathic pulmonary fibrosis) ([place]) J84.112   2. Familial idiopathic pulmonary fibrosis (HCC) J84.112     Stable disease  Plan - no need for LFT because last liver check was in November 2019 -Continue nintedanib 150 mg twice daily as before -You can try Imodium on a scheduled basis once a week to see if you can get rid of the occasional mild, diarrhea that happened suddenly -I have referred you to research for that [place] inhaler study  -Hopefully we can get you enrolled in the next 2-4 weeks  Follow-up  -6 months do spirometry and DLCO -Return to regular clinic  in 6 months  -We will probably cancel this visit if you enrolled in a IPF research study     SIGNATURE    Dr. Brand Males, M.D., F.C.C.P,  Pulmonary and Critical Care Medicine Staff Physician, [facility] Director - Interstitial Lung Disease  Program  Pulmonary [place] at [place], [place], [address]  Pager: [phone], If no answer or between  15:00h - 7:00h: call [phone] Telephone: [phone]  12:40 PM 04/29/2018

## 2018-04-29 NOTE — Progress Notes (Signed)
Spirometry and Dlco done today.

## 2018-04-30 ENCOUNTER — Telehealth: Payer: Self-pay | Admitting: Internal Medicine

## 2018-04-30 NOTE — Telephone Encounter (Signed)
Dr. Alfonso Patten is this Shaun [name]  the patient you want me to screen and consent for the [place] IPF study?

## 2018-04-30 NOTE — Telephone Encounter (Signed)
Yes this is him. You need to get him for first visit for consent and related visits next 2-4 weeks   Thanks    SIGNATURE    Dr. Brand Males, M.D., F.C.C.P,  Pulmonary and Critical Care Medicine Staff Physician, [place] Director - Interstitial Lung Disease  Program  Pulmonary [place] at [place], [place], [address]  Pager: [phone], If no answer or between  15:00h - 7:00h: call [phone] Telephone: [phone]  8:53 AM 04/30/2018

## 2018-05-01 NOTE — Telephone Encounter (Signed)
Will do. And keep you posted. Thanks, Reba Reply not needed will close message

## 2018-05-09 ENCOUNTER — Ambulatory Visit: Payer: BLUE CROSS/BLUE SHIELD | Admitting: Internal Medicine

## 2018-05-13 ENCOUNTER — Other Ambulatory Visit: Payer: Self-pay | Admitting: Internal Medicine

## 2018-05-13 DIAGNOSIS — J84112 Idiopathic pulmonary fibrosis: Secondary | ICD-10-CM

## 2018-05-13 DIAGNOSIS — Z006 Encounter for examination for normal comparison and control in clinical research program: Secondary | ICD-10-CM

## 2018-05-21 ENCOUNTER — Encounter: Payer: BLUE CROSS/BLUE SHIELD | Admitting: *Deleted

## 2018-05-21 ENCOUNTER — Encounter: Payer: Self-pay | Admitting: Internal Medicine

## 2018-05-21 ENCOUNTER — Encounter (INDEPENDENT_AMBULATORY_CARE_PROVIDER_SITE_OTHER): Payer: BLUE CROSS/BLUE SHIELD | Admitting: Internal Medicine

## 2018-05-21 ENCOUNTER — Ambulatory Visit: Payer: BLUE CROSS/BLUE SHIELD

## 2018-05-21 DIAGNOSIS — Z006 Encounter for examination for normal comparison and control in clinical research program: Secondary | ICD-10-CM

## 2018-05-21 DIAGNOSIS — J84112 Idiopathic pulmonary fibrosis: Secondary | ICD-10-CM

## 2018-05-21 LAB — PULMONARY FUNCTION TEST
DL/VA % pred: 83 %
DL/VA: 3.55 ml/min/mmHg/L
DLCO unc % pred: 64 %
DLCO unc: 15.39 ml/min/mmHg

## 2018-05-21 NOTE — Progress Notes (Signed)
Title: GALACTIC-1 [facility]) is a Phase 2b, randomized, double-blind, parallel, placebo-controlled multicenter international study to evaluate evaluate the efficacy and safety of two doses (placebo v 4m v 12m of TD139 administered once a day for 52 weeks as compared to placebo in subjects with Idiopathic Pulmonary Fibrosis. Key Primary End Point: Annual rate of decline in FVC expressed in mL over 52 weeks  Protocol #: GALACTIC-1, Clinical Trials #: [id number]: [email or web address] ([employer] [place]  Protocol Version V4.8 US_dated [phone] Version 8.0 dated 07Dec2018  ICF:  Main-: IRB version 3.0 dated Mar 06, 2018;  Optional Pharmacogenetic testing: IRB version 2.0 dated Mar 06, 2018**   Key Features of TD139 the study drug: a Galectin-3 inhibitor designed specifically to modulate the fibrogenic response to tissue injury. In multiple models of organ fibrosis, it has been demonstrated that Gal-3 is potently pro-fibrotic, modulating the activity of fibroblasts and macrophages in chronically injured tissues. It is hypothesized that increased Gal-3 expression during chronic injury leads to fibroblast activation via the aggregation of growth factor receptor clusters (such as TGF-), thereby amplifying profibrotic signal cascades.  Key Inclusion Criteria:  Age ? 4077 Diagnosis of IPF established during the previous 3 years An historical diagnostic HRCT scan within the 12 months prior to screening. Note: a separate HRCT scan will not be performed as part of the study. Any existing SoC treatment must be deemed as stable by the PI/treating physician before randomization into the study. a. FVC > 45% of the predicted  b. DLCO (corrected for Hb) of 30% to 79% of the predicted value at screening c. FEV1/FVC ?  0.7  Women of childbearing potential agree to use highly effective birth control methods during the study  Key Exclusion Criteria  Has a history of malignancy within the last  2 years with the exception of basal cell carcinoma, chronic lymphocytic leukaemia and prostate cancer requiring androgens, localised treatment and/or managed by observation. Is likely to receive lung transplantation within the next 12 months Currently receiving investigational therapy for IPF or administration of such therapeutics within 4 weeks of initial screening (or 5 half-lives, whichever is longer) Currently receiving high dose corticosteroids, cytotoxic, and or vasodilator therapy for pulmonary hypertension or administration of such therapeutics within 4 weeks of initial screening (or 5 half-lives, whichever is longer). A current dose of less than or equal to 15 mg/day of prednisone or its equivalent is acceptable if the dose is anticipated to remain stable during the study. Short term use (<1 month) of higher dose corticosteroid treatment is also permitted. Has a history of unstable or deteriorating cardiac or pulmonary disease (other than IPF) within the previous six months Has clinical evidence of active infection   Half-life: 3.5-8 hours For the 3 mg dose, the t1/2 is 3.5 hours. For 10 mg dose, the t1/2 is closer to 8 hours  Interactions No clinically significant CYP interactions  Safety Data Version 3.0. 12 Mar 2017  Animal studies: - No effect on central nervous system - No effect on blood pressure, heart rate or mean arterial pressure - No effect on QTc - No effect on respiratory system - No mention of renal/hepatic effect  Phase I studies: Conducted in 36 healthy patients, 41% reported treatment emergent adverse events (TEAE); all were mild in nature. The most frequently reported TEAE was dysgeusia. This occurred in 36% of patients and was transient. 8% of patients reported cough. 2 patients (5%) were diagnosed with an upper respiratory tract infection during the study,  and  required treatment with antibiotics. 1 patient (2%) experienced a serious adverse event; pneumonia,  this was fatal. Little systemic effect seen thus far.   ............................ This visit for [name] with DOB: 1955-10-20 on 05/21/2018 for the above protocol is Visit/Encounter # 1  and is for purpose of consent/screening . Subject/LAR expressed continued interest and consent in continuing as a study subject. Subject thanked for participation in research and contribution to science.   Subjective consent earlier today.  We have been talking to him for many months.  We emailed him a copy of the latest consent a few weeks ago.  He is ready to fully.  I did consent him personally.  He is going through screening procedures today.  I did physical exam on him.  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  When I asked him his history he never had childhood asthma.  He has never had any wheezing.  He does not believe he is an asthmatic.  Therefore he does not have asthma.   In today's research visit he will undergo spirometry and screening procedures per protocol  Objective Physical exam done and documented in paper source   ASSESSMENT/PLAN    ICD-10-CM   1. Research subject Z00.6   2. IPF (idiopathic pulmonary fibrosis) ([place]) S17.793     Patient Instructions     ICD-10-CM   1. Research subject Z00.6   2. IPF (idiopathic pulmonary fibrosis) ([place]) J84.112    Proceed per protocol      SIGNATURE    Dr. Brand Males, M.D., F.C.C.P, ACRP-CPI Pulmonary and Critical Care Medicine Research Investigator, [facility] [place] Staff Physician, [place] Director - Interstitial Lung Disease  Program  Pulmonary [facility] @ [place], [place], [address]  Pager: [phone], If no answer or between  15:00h - 7:00h: call [phone] Telephone: [phone]  11:21 AM 05/21/2018

## 2018-05-21 NOTE — Research (Signed)
Title: GALACTIC-1 [facility]) is a Phase 2b, randomized, double-blind, parallel, placebo-controlled multicenter international study to evaluate evaluate the efficacy and safety of two doses (placebo v 11m v 110m of TD139 administered once a day for 52 weeks as compared to placebo in subjects with Idiopathic Pulmonary Fibrosis. Key Primary End Point: Annual rate of decline in FVC expressed in mL over 52 weeks  Clinical ReResearch officer, political party Research RN note : This visit for Subject Shaun BELCHERith DOB: [birth year] 05/21/2018 for the above protocol is Visit/Encounter # 1/Screening  and is for purpose of research .Subject thanked for participation in research and contribution to science. Subject consented to participate in the above protocol. Refer to subject source binder for informed consent documentation checklist for further information.   In this visit 05/21/2018 the subject will be evaluated by investigator named Dr. RaChase Caller This research coordinator has verified that the investigator is up to date with his/her training logs  1. Because this visit is a key visit of  (screening) this visit is under direct supervision of the PI Dr. RaChase Caller   All procedures completed per protocol. Refer to subject source binder for details.  Signed by ReHale DroneClinical Research Coordinator / Nurse [facility]  [place]:30 PM 05/21/2018

## 2018-05-21 NOTE — Patient Instructions (Addendum)
ICD-10-CM   1. Research subject Z00.6   2. IPF (idiopathic pulmonary fibrosis) ([facility]) J84.112    Proceed per protocol

## 2018-05-30 ENCOUNTER — Other Ambulatory Visit: Payer: Self-pay | Admitting: Internal Medicine

## 2018-05-30 DIAGNOSIS — J84112 Idiopathic pulmonary fibrosis: Secondary | ICD-10-CM

## 2018-05-30 DIAGNOSIS — Z006 Encounter for examination for normal comparison and control in clinical research program: Secondary | ICD-10-CM

## 2018-06-10 ENCOUNTER — Ambulatory Visit (INDEPENDENT_AMBULATORY_CARE_PROVIDER_SITE_OTHER)
Admission: RE | Admit: 2018-06-10 | Discharge: 2018-06-10 | Disposition: A | Discharge status: Home / Self Care | Payer: Self-pay | Source: Ambulatory Visit | Attending: Internal Medicine | Admitting: Internal Medicine

## 2018-06-10 DIAGNOSIS — J84112 Idiopathic pulmonary fibrosis: Secondary | ICD-10-CM

## 2018-06-10 DIAGNOSIS — Z006 Encounter for examination for normal comparison and control in clinical research program: Secondary | ICD-10-CM

## 2018-07-01 ENCOUNTER — Other Ambulatory Visit: Payer: Self-pay | Admitting: Internal Medicine

## 2018-07-01 DIAGNOSIS — Z006 Encounter for examination for normal comparison and control in clinical research program: Secondary | ICD-10-CM

## 2018-07-01 DIAGNOSIS — J84112 Idiopathic pulmonary fibrosis: Secondary | ICD-10-CM

## 2018-07-02 ENCOUNTER — Other Ambulatory Visit: Payer: Self-pay

## 2018-07-02 ENCOUNTER — Encounter: Payer: BLUE CROSS/BLUE SHIELD | Admitting: Internal Medicine

## 2018-07-02 ENCOUNTER — Encounter: Payer: BLUE CROSS/BLUE SHIELD | Admitting: *Deleted

## 2018-07-02 DIAGNOSIS — Z006 Encounter for examination for normal comparison and control in clinical research program: Secondary | ICD-10-CM

## 2018-07-02 DIAGNOSIS — J84112 Idiopathic pulmonary fibrosis: Secondary | ICD-10-CM

## 2018-07-02 NOTE — Research (Signed)
This subject reported to the site today for 2 visits, one for IPF registry questionnaires and blood draw, and two for repeat PFT for Galactic-1 trial.  The subject completed the questionnaires for the registry first and was then escorted to the lab for blood draw (refer to [place] Mosley's note regarding completion of the IPF Registry visit).  The plan was to complete the PFT for the Galactic-1 trial following the blood draw. The subject has completed blood draws in the past without any issues. Gordy Levan, CRC verified prior to the draw that the subject had consumed a healthy breakfast prior to coming in for the visit at approximately 0630. The subject had no complaints prior to the blood draw. The patients blood draw was completed at 09:45. Proceeding the blood draw the subject began to feel light headed so the phlebotomist asked him to remain seated and Terrance left to get the subject something to drink.  I, Lamont Bing, CMA, [place],  arrived to the lab within a few minutes following the draw and saw that the patient looked pale and was resting in the chair with his eyes closed. I went to the patient, stated his name, and he opened his eyes and was able to speak to me. He was able to answer my questions and denied chest pain, headache, nausea, but stated he felt lightheaded and dizzy. I checked the subjects pulse rate which was 62 bpm and verified again that he ate breakfast that morning prior to visit. Subject stated he has not had this reaction to blood draws in the past and in fact donates blood to the [facility] a few times a year. After several minutes of observing the patient he continued to converse and his skin color began to appear more normal and he stated he was feeling less dizzy and lightheaded. The phlebotomist was with the patient as well and since he seemed to be improving I advised the patient to stay seated in the lab with the phlebotomist while I went down the hall to get the  patients research chart. I returned within a minute and half and found the subject had been moved to the floor by the phlebotomist and a clinic nurse because he started to feel very lightheaded again and his skin color became very pale and the concern was the patient would injure himself if he were to pass out while sitting in the lab chair. After lowering the patient to the floor the  patients CBG was collected and resulted in 89. A cool compress was also applied to the patients forehead. The patient did not at any time experience a fall.  After the subject rested in the supine position for approx 10 minutes, he was helped to a sitting position, and then into the lab chair again. The subject was provided a snack and beverage at this time which the subject consumed. After an additional 15-20 minutes of being in the chair the subjects skin color had returned to normal, and the patient stated he no longer felt dizzy or lightheaded. I had the subject remain with me for an additional 10-15 minutes to ensure his symptoms would not return before allowing him to drive home. I had the patient stand-up and stand in place for a minute to ensure he did not feel dizzy upon standing and the subject denied feeling any symptoms return upon standing.  The patient was then escorted to his vehicle. Approximately 30 minutes after the patient left the office I called  to ensure the subject arrived home safely and was still free of symptoms. Patient verified he arrived home and has not had any symptoms since recovery.  Due to this event it was decided to reschedule the PFT for the galactic-1 trial to Thursday April 2nd. The patient is in agreement with this plan.

## 2018-07-03 NOTE — Research (Signed)
Late Entry for [id number]  IPF PRO Registry Purpose: To collect data and biological samples that will support future research studies.  Registry will describe current approaches to diagnosis and treatment of IPF, analyze participant characteristics to describe the natural history of the disease, assess quality of life, describe participants interactions with the health care system, describe IPF treatment practices across multiple institutions, and utilize biological samples linked to well characterized IPF participants to identify disease biomarkers.  Protocol Version Date: October 30, 2016  ICF: Addendum Consent To Participate In A Research Registry : [place] IRB Approved Version 22 Jan 2018 Revised 22 Jan 2018   Clinical Research Coordinator note : This visit for [name] with DOB: 1955/07/29 on 07/02/2018 for the above protocol is Visit/Encounter # 18 month follow up  and is for purpose of research . The consent for this encounter is currently IRB approved. Subject expressed continued interest and consent in continuing as a study subject. Subject confirmed that there was no change in contact information (e.g. address, telephone, email). Subject thanked for participation in research and contribution to science.   In this visit 07/02/2018 the subject came into clinic and completed patient reported outcomes questionnaires, and had blood samples collected. This was the subjects 18 month interval follow up assessments. The subject was taken to the lab by Early Chars, study coordinator and blood was drawn at 09:45. Paxgene tube was collected first with out any issue however blood flow from subjects left arm was very slow. The phlebotomist removed (and disgarded) the needle from the subjects left arm and switched to the subjects right arm to continue the collection of the final two tubes which were collected with no problem. The subject after stated that he began to feel lightheaded and dizzy. It  was at that time I noticed the subject appearing flushed in the face and neck area and I asked if the subject would like something to drink. He stated yes he would like to have some water if possible. The phlebotomist Suzzanne Cloud instructed to remain seated and hold his head back and rest while I went to get the subject some water. Upon my return the subject had become very pale and the Phlebotomist has called for help with the subject as he appeared to be becoming more lightheaded and began to loose the ability to maintain his posture in the chair. He was then taken out of the chair by the phlebotomist and nurse and placed in the supine position on the floor where he was cared for and observed by the nursing staff. For details regarding patient care please refer to [place], [place] clinic note. Once the subject was able to regain his strength. He was discharged from the clinic and sent home. I Placed a call to the subject approximately 1 hour after the subject to follow up on his status and ensure he made it home safe. The Subject informed me he was fine and made it home safely and he was just home resting. Prior to the collection of blood the subject confirmed eating an adequate breakfast around 6:30 that morning and had no reports of any other underlying complaints or illnesses.  The subject was thanked for his time in the office and was informed to contact the office should he have any further complications from today's study visit.  Signed by  T. Early Chars BS, CCRC, [place] Coordinator I [place], [place] 9:29 AM 07/03/2018 - Late entry for [id number]

## 2018-07-04 ENCOUNTER — Other Ambulatory Visit: Payer: Self-pay

## 2018-07-04 ENCOUNTER — Encounter: Payer: BLUE CROSS/BLUE SHIELD | Admitting: *Deleted

## 2018-07-04 ENCOUNTER — Ambulatory Visit: Payer: BLUE CROSS/BLUE SHIELD

## 2018-07-04 DIAGNOSIS — J84112 Idiopathic pulmonary fibrosis: Secondary | ICD-10-CM

## 2018-07-04 DIAGNOSIS — Z006 Encounter for examination for normal comparison and control in clinical research program: Secondary | ICD-10-CM

## 2018-07-04 LAB — PULMONARY FUNCTION TEST
DL/VA % pred: 76 %
DL/VA: 3.27 ml/min/mmHg/L
DLCO cor % pred: 55 %
DLCO cor: 13.34 ml/min/mmHg
DLCO unc % pred: 58 %
DLCO unc: 14.04 ml/min/mmHg
FEF 25-75 Pre: 5.02 L/sec
FEF2575-%Pred-Pre: 204 %
FEV1-%Pred-Pre: 119 %
FEV1-Pre: 3.61 L
FEV1FVC-%Pred-Pre: 116 %
FEV6-%Pred-Pre: 108 %
FEV6-Pre: 4.11 L
FEV6FVC-%Pred-Pre: 105 %
FVC-%Pred-Pre: 102 %
FVC-Pre: 4.12 L
Pre FEV1/FVC ratio: 88 %
Pre FEV6/FVC Ratio: 100 %

## 2018-07-04 NOTE — Research (Signed)
Title: GALACTIC-1 [facility]) is a Phase 2b, randomized, double-blind, parallel, placebo-controlled multicenter international study to evaluate evaluate the efficacy and safety of two doses (placebo v 21m v 120m of TD139 administered once a day for 52 weeks as compared to placebo in subjects with Idiopathic Pulmonary Fibrosis. Key Primary End Point: Annual rate of decline in FVC expressed in mL over 52 weeks  Clinical ReResearch officer, political party Research RN note : This visit for Subject Shaun LEMAYith DOB: 2/28-Jan-1957n 07/04/2018 for the above protocol is for repeat Spirometry and DLCO for screening criteria. The subjects previous breathing tests did not meet criteria so sponsor asked that it be repeated. Procedure completed per the above mentioned protocol and has been submitted to the sponsor for review.    Prior to this appointment the subject was asked all COVID-19 screening questions. Refer to the subjects paper source binder for further details and documentation of this visit.    Signed by JeJon BillingsCMQueen Valleyoordinator  [facility]  Gr[place]atiNC[place]1:36 AM 07/04/2018

## 2018-07-30 ENCOUNTER — Telehealth: Payer: Self-pay | Admitting: Internal Medicine

## 2018-07-30 NOTE — Telephone Encounter (Signed)
Allergies  Allergen Reactions  . Penicillins Other (See Comments)    UNSPECIFIED REACTION OF CHILDHOOD Has patient had a PCN reaction causing immediate rash, facial/tongue/throat swelling, SOB or lightheadedness with hypotension:unsure Has patient had a PCN reaction causing severe rash involving mucus membranes or skin necrosis:unsure Has patient had a PCN reaction that required hospitalization:No Has patient had a PCN reaction occurring within the last 10 years:No If all of the above answers are "NO", then may proceed with Cephalosporin use.      Pleas have his dentist call me - my cell  Also,dentist might be talking about cephalosporin or levaquin  From ofev standpoint those medicines are fine  Regarding research - did he get Reba's message taht he screen failed the Galactic inhaler study?  Thanks  MR

## 2018-07-30 NOTE — Telephone Encounter (Signed)
Called and spoke with pt who stated he had a lanap (osseous surgery) performed in between two teeth and pt stated the dentist wanted to put pt on an abx due to this. Pt stated dentist mentioned an abx that was similar to penicillin but did not have penicillin in it due to him having an allergy to penicillin.  Due to pt being on the [facility] and also being in research study, pt wanted to check with MR to see if there is a certain abx that he should take that would not interact with the meds or the study.  MR, please advise on this for pt. Thanks!

## 2018-07-31 ENCOUNTER — Telehealth: Payer: Self-pay | Admitting: Internal Medicine

## 2018-07-31 NOTE — Telephone Encounter (Signed)
Attempted to call pt but call went straight to VM. Left message for pt to return my call so I can let him know the info per MR.

## 2018-07-31 NOTE — Telephone Encounter (Signed)
Please see encounter from 4/28 as this is a duplicate encounter.

## 2018-07-31 NOTE — Telephone Encounter (Signed)
Pt returned call. I stated to him the info from MR and provided pt with MR's cell number for him to give to his dentist for the dentist to call MR. Nothing further needed.

## 2018-08-05 DIAGNOSIS — Z125 Encounter for screening for malignant neoplasm of prostate: Secondary | ICD-10-CM | POA: Diagnosis not present

## 2018-08-05 DIAGNOSIS — Z5181 Encounter for therapeutic drug level monitoring: Secondary | ICD-10-CM | POA: Diagnosis not present

## 2018-08-05 DIAGNOSIS — Z0000 Encounter for general adult medical examination without abnormal findings: Secondary | ICD-10-CM | POA: Diagnosis not present

## 2018-08-05 DIAGNOSIS — E7800 Pure hypercholesterolemia, unspecified: Secondary | ICD-10-CM | POA: Diagnosis not present

## 2018-08-06 DIAGNOSIS — Z0000 Encounter for general adult medical examination without abnormal findings: Secondary | ICD-10-CM | POA: Diagnosis not present

## 2018-11-29 ENCOUNTER — Ambulatory Visit: Payer: BLUE CROSS/BLUE SHIELD | Admitting: Internal Medicine

## 2018-11-29 ENCOUNTER — Encounter: Payer: Self-pay | Admitting: Internal Medicine

## 2018-11-29 ENCOUNTER — Other Ambulatory Visit: Payer: Self-pay

## 2018-11-29 VITALS — BP 128/76 | HR 72 | Temp 97.9°F | Ht 67.0 in | Wt 140.4 lb

## 2018-11-29 DIAGNOSIS — Z23 Encounter for immunization: Secondary | ICD-10-CM | POA: Diagnosis not present

## 2018-11-29 DIAGNOSIS — J84112 Idiopathic pulmonary fibrosis: Secondary | ICD-10-CM | POA: Diagnosis not present

## 2018-11-29 DIAGNOSIS — Z5181 Encounter for therapeutic drug level monitoring: Secondary | ICD-10-CM

## 2018-11-29 LAB — HEPATIC FUNCTION PANEL
ALT: 12 U/L (ref 0–53)
AST: 24 U/L (ref 0–37)
Albumin: 4.2 g/dL (ref 3.5–5.2)
Alkaline Phosphatase: 55 U/L (ref 39–117)
Bilirubin, Direct: 0.1 mg/dL (ref 0.0–0.3)
Total Bilirubin: 0.5 mg/dL (ref 0.2–1.2)
Total Protein: 6.7 g/dL (ref 6.0–8.3)

## 2018-11-29 NOTE — Progress Notes (Signed)
OV 02/22/2016  Chief Complaint  Patient presents with   Pulmonary Consult    Pt referred by Dr.Byrum for ILD. Pt states he feels he is still recovering from the lung biopsy. Pt denies cough and CP/tightness.     2nd opinionand transfer of care for ILD/UIP - IPF from DR Byrum. Presents with his wife. They related to Dr. Stanford Breed cardiologist   62 year old male found to have crackles on physical exam and subsequently surgical lung biopsy 02/02/2016 showed UIP as read by pathologist in [place].  [employer] of chest physicians interstitial lung disease questionnaire  Symptoms: He does not cough and is not troubled by shortness of breath except with strenuous exercise. Onset is only "recently" Past medical history: Positive for chronic sinus drainage mild. Recently after surgical lung biopsy was being a few bone unintentional weight loss. Otherwise negative Personal exposure history: He smoked occasional cigarettes having grown up on her [place] farm starting at 29 including when he was age 19.  Family history: All the male members in his family who smoke developed COPD but also did not smoke to the pulmonary fibrosis especially his father and his paternal uncles. His dad was diagnosed with pulmonary fibrosis at age of 36 in 05-30-2001 and died from progressive hypoxemic respiratory failure after what sounds like an IPF flare in May 30, 2005.  Home exposure history: He is lived in the same house since 1987/05/31. The home does not have a humidifier or sound or hot tub or Jacuzzi. He is not exposed to any birds. 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 Approximately 30 years of work ending 4 years ago used to cut through dry walls and was exposed to concrete dust and drywall dust and possibly asbestos. HE is to cut  through insulation safe. 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 Did not work in a minor quarry a Teacher, music on Agricultural engineer. Denies any exposure to birds of feathers  Autoimmune history: Is negative.  History of exposure to pulmonary toxic drugs: Negative   Lab work - April 2017 and November 2017 extensive autoimmune and vascular this panel is negative  - High resolution CT scan of the chest August 2017: UIP pattern that is classic - personally visualized. There is also 5 mm right lower lobe lung nodule and a 5.9 cm left-sided lower bulla [stable compared to April 2017]   Severity - Pulmonary function test 08/02/2015: Isolated reduction in diffusion capacity 17.33/61%. Otherwise FVC 4 L/93% and normal. Total lung capacity 5.9 L/91% and normal - Walking desaturation test 185 feet 3 laps on room air in the office 02/22/2016: Did not desaturate but gets symptomatic   Emotional state: He is extremely worried about the diagnosis of IPF. This is particularly so because he has a strong family history and his father died within 73 years usually was diagnosed much order at age 83. Patient is worried that he got diagnosed with the same problem and much younger age of 35 and feels that he only will have a few years to live. Multiple times during the visit he was teary-eyed   OV 07/21/2016  Chief Complaint  Patient presents with   Follow-up    Pt denies  change in breathing since last OV. Pt denies cough and SOB and CP/tightness.      Follow-up idiopathic pulmonary fibrosis familial variety  He presents with his wife. Overall he is stable. 4 routine activities of daily living and mowing the yard he does not have any shortness of breath or cough. He did find the mountains recently been quite dyspneic. This is worse than June 2017 with for the diagnosis of IPF. Overall he is tolerating the Ofev just fine  except occasionally he has some GI discomfort.He is questions about research trials, exercise, pulmonary fibrosis foundation and family genetics.he is quite active and diffuse pulmonary rehabilitation is easy for him. He is willing to go to [facility] and work on aerobic strengthening and muscle conditioning  Notice that she's not had pulmonary function testing since May 2017    has a past medical history of Dyspnea; Hyperlipemia; and Pneumonia.   reports that he has never smoked. He has never used smokeless tobacco.  OV 10/25/2016  Chief Complaint  Patient presents with   Follow-up    Pt here after PFT. Pt states his breathing is unchanged. Pt c/o PND and dry cough. Pt denies f/c/s and CP/tightenss.    Follow-up mild idiopathic pulmonary fibrosis he's on Ofev since  NOv 2017  Here with his wife. Overall he is stable. His pulmonary function tests and CT scan of the chest shows continued stability for 1 year. Symptom-wise also he is stable. His dyspnea when doing mountains. He is kayaking and biking and is able to outpace his wife. During this time he does not check his pulse oximetry. He does not have one. I've advised that he can get one. He is asking about familial issues, research and also prognosis we went over all this. Of note CT scan of the chest shows also he has concomitant emphysema. He prefers to avoid medications but he is interested in taking an inhaler if it means improving his dyspnea. He is active with pulmonary fibrosis foundation patient support groupo. Of note he reports easy bruising in his forearm when he bumps into something. His wife thinks it's been there all along and is unchanged. He is wondering if that this is related to Ofev and asa. We did discuss about the bleeding risk of Ofev and the need to hold this before any procedure. Overall he tolerates Ofev just fine except for occasional diarrhea once a month. This is associated with fiber food    OV  02/26/2017  Chief  Complaint  Patient presents with   Follow-up    PFT done today. Pt still taking OFEV and doing good on it. Denies any complaints with cough, SOB, or CP.    Follow-up idiopathic pulmonary fibrosis on Nintendanib since November 2017  This time his wife is not here with him.  He has now been on nintedanib for a year.  Overall he is tolerating it fine except for mild occasional diarrhea.  He continues to be very active working out in the yard and doing daily walks with a few miles.  He notices only mild dyspnea with this relieved by rest.  He does not feel his health has declined in the last 1 year.  He does have concomitant emphysema on the CT scan but not apparent on the PFTs.  I asked him to try Spiriva last time but it did not help.  He is dissatisfied with occasional albuterol use.  He did have pulmonary function test today which is documented below.  The FVC is stable but there is a change in the DLCO but he is not feeling it.  He is on IPF registry by [facility];  he is due for a study visit today.  He is interested in further studies especially [place].  We discussed this in detail brief.  He has had genetics testing he got the result.  He is worried insurance will deny this.  He is participating in the pulmonary fibrosis foundation patient support group.  He is asking details about the March 17, 2017 local [place]   OV 05/22/2017  Chief Complaint  Patient presents with   Follow-up    PFT done today.  Pt stated he had the flu 05/05/17 and was not able to eat on 05/06/17 so he missed two OFEV doses. States he has had diarrhea a couple times since then but is now doing fine with help from immodium.  States his breathing has been doing good.      Follow-up idiopathic pulmonary fibrosis on Nintendanib since November 7683   63 year old male with idiopathic pulmonary fibrosis.  He is on stable dose of nintedanib.  He tells me earlier this month he had stomach flu and that he suddenly had subjective  sense of fever associated with diarrhea and vomiting.  This resolved in 2 days.  Since then he is lost a couple of pounds of weight.  He is worried about tolerating the nintedanib.  In addition primary function test shows a 3% decline in FVC and 2 years and a 12% decline in DLCO in 2 years.  He is now wondering if the drug is effective.  Overall other than the recent illness and occasional mild diarrhea he is tolerated off of just fine.  But he is now worried about the tolerance.  He has been advised by [facility] nursing support team about taking protein with nintedanib to offset side effects and he has questions about this.   OV 08/14/2017  Chief Complaint  Patient presents with   Follow-up    PFT done today. Pt states he has been doing good since last visit and denies any complaints.     Follow-up idiopathic pulmonary fibrosis surgical lung biopsy diagnosis February 02, 2016 on Nintendanib since November 2017  Mr. Tones continues to do well.  At the time of last visit there was concern about weight loss and also progression in lung function.  However at that time he had a respiratory infection.  Today he comes back and tells me that he has gained 1 pound of weight.  His respiratory symptoms have resolved he is back to being at a stable mild exertional dyspnea.  He is extremely active.  Concordant with his symptom improvement his FVC has improved and his DLCO is stable.  Overall his pulmonary function test is stable since November 2018 at least with a DLCO although the DLCO has progressed since 2017.  His FVC has been stable all along since May 2017.  He is not interested in the [place] study because of the oral pill and can make his diarrhea potentially worse if he gets randomized to the drug.  He is interested in an upcoming inhaler trial of IPF.  He is active in the support group and is asking about the next meeting.  He is not on oxygen.        Ct Chest High Resolution  Result Date:  10/25/2016 CLINICAL DATA:  Interstitial lung disease, former smoker, cough. Lung biopsy November 2017. EXAM: CT CHEST  WITHOUT CONTRAST TECHNIQUE: Multidetector CT imaging of the chest was performed following the standard protocol without intravenous contrast. High resolution imaging of the lungs, as well as inspiratory and expiratory imaging, was performed. COMPARISON:  11/23/2015 and 07/08/2015. FINDINGS: Cardiovascular: Vascular structures are unremarkable. Heart size normal. No pericardial effusion. Mediastinum/Nodes: Mediastinal lymph nodes are not enlarged by CT size criteria. Hilar regions are difficult to evaluate without IV contrast. No axillary adenopathy. Esophagus is grossly unremarkable. Lungs/Pleura: Centrilobular and paraseptal emphysema with a large bullous lesion in the left lower lobe. Post biopsy changes in the right upper, right middle and right lower lobes. Superimposed basilar predominant subpleural reticulation, ground-glass and traction bronchiectasis/bronchiolectasis, likely stable from baseline examination of 07/08/2015 when differences in slice collimation are considered. There may be scattered honeycombing. Mild subpleural nodularity along the right major fissure is unchanged from 07/08/2015, indicative of subpleural lymph nodes. No pleural fluid. Airway is unremarkable. Upper Abdomen: Visualized portions of the liver and gallbladder are unremarkable. Mild nodular thickening of both adrenal glands. Visualized portion of the right kidney is unremarkable. 2.5 cm low-attenuation lesion in the left kidney is likely a cyst. Visualized portions of the spleen, pancreas, stomach and bowel are grossly unremarkable. No upper abdominal adenopathy. Musculoskeletal: No worrisome lytic or sclerotic lesions. Degenerative changes in the spine. IMPRESSION: 1. Pulmonary parenchymal pattern of fibrosis is likely stable from 07/08/2015 when differences in slice collimation are considered. Findings are in  keeping with the pathologic diagnosis of usual interstitial pneumonitis. 2.  Emphysema (ICD10-J43.9). Electronically Signed   By: Lorin Picket M.D.   On: 10/25/2016 08:23   OV 02/26/2018  Subjective:  Patient ID: Shaun [name], male , DOB: Mar 01, 1956 , age 34 y.o. , MRN: [id number] , ADDRESS: [place] [address]   02/26/2018 -   Chief Complaint  Patient presents with   Follow-up    breathing well     HPI Shaun [name] 63 y.o. -follow-up familial IPF.  Last seen in May 2019.  After that in July 2019 he is screened for the electric face to inhaler study but he screen failed because he is standard of care CT scan of the chest from July 2018 was not per sponsored protocol or 2018 ATS protocol.  At this point in time he continues on nintedanib.  He is not taking proton pump inhibitor and wants to know if that is okay.  He feels he is stable.  There are no new issues.  The main thing is with nintedanib once a month he will have sporadic diarrhea.  He says he has been unable to control this but from a respiratory standpoint he feels he is stable.  There are no new medical issues.  He is up-to-date with his vaccines although immunization record review shows that he could benefit from Prevnar 13 which she has not had.  He continues to be actively engaged in the patient support group.   ROS - per HPI    OV 04/29/2018  Subjective:  Patient ID: Shaun [name], male , DOB: [birth year] , age 64 y.o. , MRN: [id number] , ADDRESS: [place] [address]   04/29/2018 -   Chief Complaint  Patient presents with   Follow-up    PFT performed today.  Pt states he is about the same since last visit.      HPI Shaun [name] 63 y.o. -returns for IPF follow-up.  He is on nintedanib.  He reports continued stability.  He still has  ongoing occasional issue with diarrhea that happened suddenly every few weeks.  It is so random.  It bothers him and does affect his  quality of life.  He takes Imodium in response.  We did discuss about taking Imodium proactively once a week.  He is now to give this a try.  He continues to be interested in pulmonary reserve study particularly face to inhaler study sponsored by [employer].  Our research department is getting ready and probably should be able to screen him in the next few to several weeks.  His last pulmonary function test was today and shows continued stability.  Last liver function test was November 2019 and was normal.  His walk test today is baseline     ROS   OV 11/29/2018  Subjective:  Patient ID: Shaun [name], male , DOB: 1956-03-03 , age 75 y.o. , MRN: [id number] , ADDRESS: [place] [address]   11/29/2018 -   Chief Complaint  Patient presents with   IPF (idiopathic pulmonary fibrosis)    No issues, states medication is working for him.   Follow-up idiopathic pulmonary fibrosis on Nintendanib since November 2017  HPI Shaun [name] 63 y.o. -presents for follow-up of IPF.  Last visit was early in 2020.  And then the pandemic care.  He try to get down an inhaler trial of phase 2 for IPF - [place] but he screen failed because of technical issues with the spirometry.  He says overall he is doing well.  He is just following low risk activities.  He has a christening coming up for his grandson.  This will be outdoors in [place].  A total of 12 people will attend.  Everybody will be Masked.  He continues taking nintedanib and does not have any issues tolerating it.  Last liver function test was over 6 months ago.  He needs another one now.  He is also asking for a flu shot.  His symptom score and walking desaturation test showed continued stability.    SYMPTOM SCALE - ILD 11/29/2018   O2 use Room air  Shortness of Breath 0 -> 5 scale with 5 being worst (score 6 If unable to do)  At rest 0  Simple tasks - showers, clothes change, eating, shaving 0  Household (dishes,  doing bed, laundry) 0  Shopping 0  Walking level at own pace 0  Walking keeping up with others of same age 8  Walking up Stairs 1  Walking up Hill 1  Total (40 - 48) Dyspnea Score 2  How bad is your cough? 0  How bad is your fatigue 0        Simple office walk - 250 feet x 3 laps 02/26/2018  04/29/2018  11/29/2018   O2 used Room air Room air Room air  Number laps completed _0 Comments about pace Good brisk    Resting Pulse Ox/HR 100% and 74/min 100% and 86/min     100% and 76/min  Final Pulse Ox/HR 96% and 96/min 97% and 93/min 96% and 99/min  Desaturated </= 88% no no no  Desaturated <= 3% points yes Yes, 3 Yes, 4  Got Tachycardic >/= 90/min yes yes yes  Symptoms at end of test No dyspnea No dyspnea   Miscellaneous comments x x      Results for Shaun [name], Shaun [name] (MRN [id number]) as of 11/29/2018 09:56  Ref. Range 08/02/2015 10:41 10/25/2016 08:44 02/26/2017 09:06 05/22/2017 08:59 08/14/2017 12:41  10/19/2017 12:23 04/29/2018 10:47 05/21/2018 10:11 07/04/2018 08:59  FVC-Pre Latest Units: L 4.00 4.16 4.04 3.91 4.11  4.16  4.12  FVC-%Pred-Pre Latest Units: % 93 98 95 92 97  98  102  Results for Shaun [name], Shaun [name] (MRN [id number]) as of 11/29/2018 09:56  Ref. Range 08/02/2015 10:41 10/25/2016 08:44 02/26/2017 09:06 05/22/2017 08:59 08/14/2017 12:41 10/19/2017 12:23 04/29/2018 10:47 05/21/2018 10:11 07/04/2018 08:59  DLCO unc Latest Units: ml/min/mmHg 17.33 17.25 14.23 15.14 14.42 13.93 14.84 15.39 14.04  DLCO unc % pred Latest Units: % 61 60 50 53 50 49 52 64 58      ROS - per HPI     has a past medical history of Dyspnea, Family history of colon cancer, Family history of prostate cancer, Family history of pulmonary fibrosis, Hyperlipemia, and Pneumonia.   reports that he has never smoked. He has never used smokeless tobacco.  Past Surgical History:  Procedure Laterality Date   COLONOSCOPY WITH PROPOFOL N/A 09/21/2014   Procedure: COLONOSCOPY WITH PROPOFOL;  Surgeon: Garlan Fair, MD;  Location: [facility];  Service: Endoscopy;  Laterality: N/A;   FOREARM SURGERY     excision of birth mark"child"   HERNIA REPAIR     RIH-open   KNEE ARTHROTOMY Left    bursa excision   LUNG BIOPSY Right 02/02/2016   Procedure: LUNG BIOPSY;  Surgeon: [name], MD;  Location: [facility];  Service: Thoracic;  Laterality: Right;   NASAL SEPTUM SURGERY     VIDEO ASSISTED THORACOSCOPY Right 02/02/2016   Procedure: VIDEO ASSISTED THORACOSCOPY;  Surgeon: [name], MD;  Location: [facility];  Service: Thoracic;  Laterality: Right;   WRIST GANGLION EXCISION Left     Allergies  Allergen Reactions   Penicillins Other (See Comments)    UNSPECIFIED REACTION OF CHILDHOOD Has patient had a PCN reaction causing immediate rash, facial/tongue/throat swelling, SOB or lightheadedness with hypotension:unsure Has patient had a PCN reaction causing severe rash involving mucus membranes or skin necrosis:unsure Has patient had a PCN reaction that required hospitalization:No Has patient had a PCN reaction occurring within the last 10 years:No If all of the above answers are "NO", then may proceed with Cephalosporin use.     Immunization History  Administered Date(s) Administered   Influenza,inj,Quad PF,6+ Mos 12/09/2016, 11/26/2017   Influenza-Unspecified 11/02/2015, 12/09/2016   Pneumococcal Conjugate-13 02/26/2018   Pneumococcal Polysaccharide-23 02/05/2016   Zoster 11/02/2015   Zoster Recombinat (Shingrix) 12/09/2016, 02/10/2017    Family History  Problem Relation Age of Onset   Lung cancer Mother    Colon cancer Mother 6   Pulmonary fibrosis Father        dx in his 24s   Prostate cancer Brother 52   COPD Paternal Grandfather    Other Paternal Uncle        possible lung dx; died around age 64   Other Cousin        possible lung dx, died in her 40s; paternal first cousin   Healthy Son      Current Outpatient Medications:    albuterol  (PROVENTIL HFA;VENTOLIN HFA) 108 (90 Base) MCG/ACT inhaler, Inhale 2 puffs into the lungs every 6 (six) hours as needed for wheezing or shortness of breath., Disp: 1 Inhaler, Rfl: 5   aspirin EC 81 MG tablet, Take 81 mg by mouth daily., Disp: , Rfl:    Chlorpheniramine-Pseudoeph 4-60 MG TABS, Take 1 tablet by mouth 2 (two) times daily as needed (for allergies.)., Disp: , Rfl:  Multiple Vitamin (MULTIVITAMIN WITH MINERALS) TABS tablet, Take 1 tablet by mouth daily., Disp: , Rfl:    OFEV 150 MG CAPS, TAKE 1 CAPSULE BY MOUTH TWICE DAILY 12 HOURS APART WITH FOOD, Disp: 60 capsule, Rfl: 11   simvastatin (ZOCOR) 10 MG tablet, Take 10 mg by mouth daily at 6 PM. , Disp: , Rfl: 0      Objective:   Vitals:   11/29/18 0945  BP: 128/76  Pulse: 72  Temp: 97.9 F (36.6 C)  SpO2: 99%  Weight: 140 lb 6.4 oz (63.7 kg)  Height: _0  (1.702 m)    Estimated body mass index is 21.99 kg/m as calculated from the following:   Height as of this encounter: _1  (1.702 m).   Weight as of this encounter: 140 lb 6.4 oz (63.7 kg).  _2 @  [employer]   11/29/18 0945  Weight: 140 lb 6.4 oz (63.7 kg)     Physical Exam  General Appearance:    Alert, cooperative, no distress, appears stated age - yes , Deconditioned looking - no , OBESE  - no, Sitting on Wheelchair -  no  Head:    Normocephalic, without obvious abnormality, atraumatic  Eyes:    PERRL, conjunctiva/corneas clear,  Ears:    Normal TM's and external ear canals, both ears  Nose:   Nares normal, septum midline, mucosa normal, no drainage    or sinus tenderness. OXYGEN ON  - no . Patient is @ ra   Throat:   Lips, mucosa, and tongue normal; teeth and gums normal. Cyanosis on lips - no  Neck:   Supple, symmetrical, trachea midline, no adenopathy;    thyroid:  no enlargement/tenderness/nodules; no carotid   bruit or JVD  Back:     Symmetric, no curvature, ROM normal, no CVA tenderness  Lungs:     Distress - no , Wheeze no,  Barrell Chest - no, Purse lip breathing - no, Crackles - yes at base   Chest Wall:    No tenderness or deformity.    Heart:    Regular rate and rhythm, S1 and S2 normal, no rub   or gallop, Murmur - no  Breast Exam:    NOT DONE  Abdomen:     Soft, non-tender, bowel sounds active all four quadrants,    no masses, no organomegaly. Visceral obesity - no  Genitalia:   NOT DONE  Rectal:   NOT DONE  Extremities:   Extremities - normal, Has Cane - no, Clubbing - no, Edema - no  Pulses:   2+ and symmetric all extremities  Skin:   Stigmata of Connective Tissue Disease - yes  Lymph nodes:   Cervical, supraclavicular, and axillary nodes normal  Psychiatric:  Neurologic:   Pleasant - yes, Anxious - no, Flat affect - no  CAm-ICU - neg, Alert and Oriented x 3 - yes, Moves all 4s - yes, Speech - normal, Cognition - intact           Assessment:       ICD-10-CM   1. IPF (idiopathic pulmonary fibrosis) (HCC)  J84.112 Hepatic function panel  2. Familial idiopathic pulmonary fibrosis (HCC)  J84.112 Hepatic function panel  3. Encounter for therapeutic drug monitoring  Z51.81 Hepatic function panel       Plan:     Patient Instructions      ICD-10-CM   1. IPF (idiopathic pulmonary fibrosis) ([facility])  J84.112   2. Familial idiopathic pulmonary fibrosis ([facility])  N02.725  3. Encounter for therapeutic drug monitoring  Z51.81      Stable IPF on Nintedanib Too bad you screen failed for   Plan  - flu shot 11/29/2018 - lft check 11/29/2018 - continue Nintednaib (Ofev) as before - check out www.pharmquest.biz  214-106-4455 for any covid-19 vaccine trials  Followup  - 4-6 months do spirometry and dlco  - return to clinic (30 min slot) for followup in 4-6 months - return sooner if needed   > 50% of this > 25 min visit spent in face to face counseling or coordination of care - by this undersigned MD - Dr Brand Males. This includes one or more of the following documented above: discussion of  test results, diagnostic or treatment recommendations, prognosis, risks and benefits of management options, instructions, education, compliance or risk-factor reduction   SIGNATURE    Dr. Brand Males, M.D., F.C.C.P,  Pulmonary and Critical Care Medicine Staff Physician, [place] Director - Interstitial Lung Disease  Program  Pulmonary [place] at [place], [place], [address]  Pager: [phone], If no answer or between  15:00h - 7:00h: call [phone] Telephone: [phone]  10:23 AM 11/29/2018

## 2018-11-29 NOTE — Patient Instructions (Addendum)
ICD-10-CM   1. IPF (idiopathic pulmonary fibrosis) ([place])  J84.112   2. Familial idiopathic pulmonary fibrosis ([place])  J84.112   3. Encounter for therapeutic drug monitoring  Z51.81      Stable IPF on Nintedanib Too bad you screen failed for   Plan  - flu shot 11/29/2018 - lft check 11/29/2018 - continue Nintednaib (Ofev) as before - check out www.pharmquest.biz  [phone] for any covid-19 vaccine trials  Followup  - 4-6 months do spirometry and dlco  - return to clinic (30 min slot) for followup in 4-6 months - return sooner if needed

## 2018-12-02 ENCOUNTER — Telehealth: Payer: Self-pay | Admitting: Internal Medicine

## 2018-12-02 NOTE — Telephone Encounter (Signed)
Notes recorded by Brand Males, MD on 11/29/2018 at 1:03 PM EDT  .Lab       11/29/18            1032      AST     24       ALT     12       ALKPHOS   55       BILITOT   0.5      PROT     6.7      ALBUMIN   4.2       LFT normal  Spoke with pt, aware of results/recs.  Nothing further needed.

## 2018-12-19 ENCOUNTER — Encounter: Payer: Self-pay | Admitting: General Surgery

## 2018-12-26 DIAGNOSIS — H524 Presbyopia: Secondary | ICD-10-CM | POA: Diagnosis not present

## 2019-01-16 ENCOUNTER — Encounter: Payer: BC Managed Care – PPO | Admitting: Internal Medicine

## 2019-01-16 ENCOUNTER — Telehealth: Payer: Self-pay | Admitting: Internal Medicine

## 2019-01-16 DIAGNOSIS — J84112 Idiopathic pulmonary fibrosis: Secondary | ICD-10-CM

## 2019-01-16 NOTE — Telephone Encounter (Signed)
Last seen end of August 2020.  I saw him briefly today in the hallway when he came to do a research consent.  He is stable.  Please ensure follow-up in January or February 2020 with spirometry and DLCO in a 30-minute time slot

## 2019-01-20 ENCOUNTER — Other Ambulatory Visit: Payer: Self-pay

## 2019-01-20 NOTE — Research (Signed)
LATE ENTRY     IPF PRO Registry Purpose: To collect data and biological samples that will support future research studies.  Registry will describe current approaches to diagnosis and treatment of IPF, analyze participant characteristics to describe the natural history of the disease, assess quality of life, describe participants interactions with the health care system, describe IPF treatment practices across multiple institutions, and utilize biological samples linked to well characterized IPF participants to identify disease biomarkers.   On 01/16/2019, this subject reported to site for re-consent on to Addendum Consent To Participate in [place] and re-draw of DNA sample. Subject completed re-consent and DNA sample was collected per the protocol. Refer to the subjects paper source binder for further details.   [place] [employer]

## 2019-01-21 NOTE — Telephone Encounter (Signed)
Will leave in Emily's box for now as January and February's schedule is not yet posted to schedule on.

## 2019-02-10 NOTE — Telephone Encounter (Signed)
Will route this to [facility] pool as they are the ones who are scheduling the PFTs. Pt will need to have the PFT followed by OV with MR after in either January or February 2021

## 2019-02-10 NOTE — Telephone Encounter (Signed)
He is on the recall list they will contact the patient  With the appts

## 2019-04-11 ENCOUNTER — Other Ambulatory Visit: Payer: Self-pay | Admitting: Internal Medicine

## 2019-04-25 ENCOUNTER — Telehealth: Payer: Self-pay | Admitting: Pharmacy Technician

## 2019-04-25 NOTE — Telephone Encounter (Signed)
Received notification from [facility] regarding a prior authorization for OFEV 170m. Authorization has been APPROVED from 04/25/2019 to 04/24/2020.   Will send document to scan center.  Authorization # [id number]  2:58 PM RBeatriz Chancellor CPhT

## 2019-06-05 ENCOUNTER — Ambulatory Visit: Payer: BC Managed Care – PPO | Attending: Internal Medicine

## 2019-06-05 DIAGNOSIS — Z23 Encounter for immunization: Secondary | ICD-10-CM | POA: Insufficient documentation

## 2019-06-05 NOTE — Progress Notes (Signed)
   Covid-19 Vaccination Clinic  Name:  Shaun [name]    MRN: [id number] DOB: 09/25/1955  06/05/2019  [name] was observed post Covid-19 immunization for 15 minutes without incident. He was provided with Vaccine Information Sheet and instruction to access the V-Safe system.   [name] was instructed to call 911 with any severe reactions post vaccine: Marland Kitchen Difficulty breathing  . Swelling of face and throat  . A fast heartbeat  . A bad rash all over body  . Dizziness and weakness   Immunizations Administered    Name Date Dose VIS Date Route   Moderna COVID-19 Vaccine 06/05/2019  9:55 AM 0.5 mL 03/04/2019 Intramuscular   Manufacturer: Moderna   Lot: [id number]   [place]: [phone]

## 2019-06-16 ENCOUNTER — Other Ambulatory Visit: Payer: Self-pay | Admitting: Internal Medicine

## 2019-07-08 ENCOUNTER — Ambulatory Visit: Payer: BC Managed Care – PPO | Attending: Internal Medicine

## 2019-07-08 DIAGNOSIS — Z23 Encounter for immunization: Secondary | ICD-10-CM

## 2019-07-08 NOTE — Progress Notes (Signed)
   Covid-19 Vaccination Clinic  Name:  FLORIAN CHAUCA    MRN: [id number] DOB: [birth year]  07/08/2019  Mr. Cofield was observed post Covid-19 immunization for 15 minutes without incident. He was provided with Vaccine Information Sheet and instruction to access the V-Safe system.   Mr. Jandreau was instructed to call 911 with any severe reactions post vaccine: Marland Kitchen Difficulty breathing  . Swelling of face and throat  . A fast heartbeat  . A bad rash all over body  . Dizziness and weakness   Immunizations Administered    Name Date Dose VIS Date Route   Moderna COVID-19 Vaccine 07/08/2019  8:55 AM 0.5 mL 03/04/2019 Intramuscular   Manufacturer: Moderna   Lot: [id number]   [place]: [id number]

## 2019-07-15 ENCOUNTER — Ambulatory Visit: Payer: BC Managed Care – PPO | Admitting: Internal Medicine

## 2019-07-15 ENCOUNTER — Encounter (INDEPENDENT_AMBULATORY_CARE_PROVIDER_SITE_OTHER): Payer: BC Managed Care – PPO | Admitting: Internal Medicine

## 2019-07-15 ENCOUNTER — Encounter: Payer: Self-pay | Admitting: Internal Medicine

## 2019-07-15 ENCOUNTER — Other Ambulatory Visit: Payer: Self-pay

## 2019-07-15 VITALS — BP 128/68 | HR 79 | Temp 97.2°F | Ht 67.0 in | Wt 142.8 lb

## 2019-07-15 DIAGNOSIS — J84112 Idiopathic pulmonary fibrosis: Secondary | ICD-10-CM

## 2019-07-15 DIAGNOSIS — Z5181 Encounter for therapeutic drug level monitoring: Secondary | ICD-10-CM

## 2019-07-15 LAB — HEPATIC FUNCTION PANEL
ALT: 13 U/L (ref 0–53)
AST: 26 U/L (ref 0–37)
Albumin: 4 g/dL (ref 3.5–5.2)
Alkaline Phosphatase: 57 U/L (ref 39–117)
Bilirubin, Direct: 0.1 mg/dL (ref 0.0–0.3)
Total Bilirubin: 0.5 mg/dL (ref 0.2–1.2)
Total Protein: 6.2 g/dL (ref 6.0–8.3)

## 2019-07-15 NOTE — Progress Notes (Signed)
OV 02/22/2016  Chief Complaint  Patient presents with  . Pulmonary Consult    Pt referred by Dr.Byrum for ILD. Pt states he feels he is still recovering from the lung biopsy. Pt denies cough and CP/tightness.     2nd opinionand transfer of care for ILD/UIP - IPF from DR Byrum. Presents with his wife. They related to Dr. Stanford Breed cardiologist   64 year old male found to have crackles on physical exam and subsequently surgical lung biopsy 02/02/2016 showed UIP as read by pathologist in [place].  [employer] of chest physicians interstitial lung disease questionnaire  Symptoms: He does not cough and is not troubled by shortness of breath except with strenuous exercise. Onset is only "recently" Past medical history: Positive for chronic sinus drainage mild. Recently after surgical lung biopsy was being a few bone unintentional weight loss. Otherwise negative Personal exposure history: He smoked occasional cigarettes having grown up on her [place] farm starting at 89 including when he was age 77.  Family history: All the male members in his family who smoke developed COPD but also did not smoke to the pulmonary fibrosis especially his father and his paternal uncles. His dad was diagnosed with pulmonary fibrosis at age of 39 in 2001/05/29 and died from progressive hypoxemic respiratory failure after what sounds like an IPF flare in 05-29-2005.  Home exposure history: He is lived in the same house since 05-30-87. The home does not have a humidifier or sound or hot tub or Jacuzzi. He is not exposed to any birds. 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 Approximately 30 years of work ending 4 years ago used to cut through dry walls and was exposed to concrete dust and drywall dust and possibly asbestos. HE is to cut  through insulation safe. 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 Did not work in a minor quarry a Teacher, music on Agricultural engineer. Denies any exposure to birds of feathers  Autoimmune history: Is negative.  History of exposure to pulmonary toxic drugs: Negative   Lab work - April 2017 and November 2017 extensive autoimmune and vascular this panel is negative  - High resolution CT scan of the chest August 2017: UIP pattern that is classic - personally visualized. There is also 5 mm right lower lobe lung nodule and a 5.9 cm left-sided lower bulla [stable compared to April 2017]   Severity - Pulmonary function test 08/02/2015: Isolated reduction in diffusion capacity 17.33/61%. Otherwise FVC 4 L/93% and normal. Total lung capacity 5.9 L/91% and normal - Walking desaturation test 185 feet 3 laps on room air in the office 02/22/2016: Did not desaturate but gets symptomatic   Emotional state: He is extremely worried about the diagnosis of IPF. This is particularly so because he has a strong family history and his father died within 42 years usually was diagnosed much order at age 21. Patient is worried that he got diagnosed with the same problem and much younger age of 76 and feels that he only will have a few years to live. Multiple times during the visit he was teary-eyed   OV 07/21/2016  Chief Complaint  Patient presents with  . Follow-up    Pt  denies change in breathing since last OV. Pt denies cough and SOB and CP/tightness.      Follow-up idiopathic pulmonary fibrosis familial variety  He presents with his wife. Overall he is stable. 4 routine activities of daily living and mowing the yard he does not have any shortness of breath or cough. He did find the mountains recently been quite dyspneic. This is worse than June 2017 with for the diagnosis of IPF. Overall he is tolerating the Ofev just fine  except occasionally he has some GI discomfort.He is questions about research trials, exercise, pulmonary fibrosis foundation and family genetics.he is quite active and diffuse pulmonary rehabilitation is easy for him. He is willing to go to [facility] and work on aerobic strengthening and muscle conditioning  Notice that she's not had pulmonary function testing since May 2017    has a past medical history of Dyspnea; Hyperlipemia; and Pneumonia.   reports that he has never smoked. He has never used smokeless tobacco.  OV 10/25/2016  Chief Complaint  Patient presents with  . Follow-up    Pt here after PFT. Pt states his breathing is unchanged. Pt c/o PND and dry cough. Pt denies f/c/s and CP/tightenss.    Follow-up mild idiopathic pulmonary fibrosis he's on Ofev since  NOv 2017  Here with his wife. Overall he is stable. His pulmonary function tests and CT scan of the chest shows continued stability for 1 year. Symptom-wise also he is stable. His dyspnea when doing mountains. He is kayaking and biking and is able to outpace his wife. During this time he does not check his pulse oximetry. He does not have one. I've advised that he can get one. He is asking about familial issues, research and also prognosis we went over all this. Of note CT scan of the chest shows also he has concomitant emphysema. He prefers to avoid medications but he is interested in taking an inhaler if it means improving his dyspnea. He is active with pulmonary fibrosis foundation patient support groupo. Of note he reports easy bruising in his forearm when he bumps into something. His wife thinks it's been there all along and is unchanged. He is wondering if that this is related to Ofev and asa. We did discuss about the bleeding risk of Ofev and the need to hold this before any procedure. Overall he tolerates Ofev just fine except for occasional diarrhea once a month. This is associated with fiber food    OV  02/26/2017  Chief  Complaint  Patient presents with  . Follow-up    PFT done today. Pt still taking OFEV and doing good on it. Denies any complaints with cough, SOB, or CP.    Follow-up idiopathic pulmonary fibrosis on Nintendanib since November 2017  This time his wife is not here with him.  He has now been on nintedanib for a year.  Overall he is tolerating it fine except for mild occasional diarrhea.  He continues to be very active working out in the yard and doing daily walks with a few miles.  He notices only mild dyspnea with this relieved by rest.  He does not feel his health has declined in the last 1 year.  He does have concomitant emphysema on the CT scan but not apparent on the PFTs.  I asked him to try Spiriva last time but it did not help.  He is dissatisfied with occasional albuterol use.  He did have pulmonary function test today which is documented below.  The FVC is stable but there is a change in the DLCO but he is not feeling it.  He is on IPF registry by [facility];  he is due for a study visit today.  He is interested in further studies especially [place].  We discussed this in detail brief.  He has had genetics testing he got the result.  He is worried insurance will deny this.  He is participating in the pulmonary fibrosis foundation patient support group.  He is asking details about the March 17, 2017 local [place]   OV 05/22/2017  Chief Complaint  Patient presents with  . Follow-up    PFT done today.  Pt stated he had the flu 05/05/17 and was not able to eat on 05/06/17 so he missed two OFEV doses. States he has had diarrhea a couple times since then but is now doing fine with help from immodium.  States his breathing has been doing good.      Follow-up idiopathic pulmonary fibrosis on Nintendanib since November 7258   64 year old male with idiopathic pulmonary fibrosis.  He is on stable dose of nintedanib.  He tells me earlier this month he had stomach flu and that he suddenly had subjective  sense of fever associated with diarrhea and vomiting.  This resolved in 2 days.  Since then he is lost a couple of pounds of weight.  He is worried about tolerating the nintedanib.  In addition primary function test shows a 3% decline in FVC and 2 years and a 12% decline in DLCO in 2 years.  He is now wondering if the drug is effective.  Overall other than the recent illness and occasional mild diarrhea he is tolerated off of just fine.  But he is now worried about the tolerance.  He has been advised by [facility] nursing support team about taking protein with nintedanib to offset side effects and he has questions about this.   OV 08/14/2017  Chief Complaint  Patient presents with  . Follow-up    PFT done today. Pt states he has been doing good since last visit and denies any complaints.     Follow-up idiopathic pulmonary fibrosis surgical lung biopsy diagnosis February 02, 2016 on Nintendanib since November 2017  Mr. Riva continues to do well.  At the time of last visit there was concern about weight loss and also progression in lung function.  However at that time he had a respiratory infection.  Today he comes back and tells me that he has gained 1 pound of weight.  His respiratory symptoms have resolved he is back to being at a stable mild exertional dyspnea.  He is extremely active.  Concordant with his symptom improvement his FVC has improved and his DLCO is stable.  Overall his pulmonary function test is stable since November 2018 at least with a DLCO although the DLCO has progressed since 2017.  His FVC has been stable all along since May 2017.  He is not interested in the [place] study because of the oral pill and can make his diarrhea potentially worse if he gets randomized to the drug.  He is interested in an upcoming inhaler trial of IPF.  He is active in the support group and is asking about the next meeting.  He is not on oxygen.        Ct Chest High Resolution  Result Date:  10/25/2016 CLINICAL DATA:  Interstitial lung disease, former smoker, cough. Lung biopsy November 2017. EXAM: CT CHEST  WITHOUT CONTRAST TECHNIQUE: Multidetector CT imaging of the chest was performed following the standard protocol without intravenous contrast. High resolution imaging of the lungs, as well as inspiratory and expiratory imaging, was performed. COMPARISON:  11/23/2015 and 07/08/2015. FINDINGS: Cardiovascular: Vascular structures are unremarkable. Heart size normal. No pericardial effusion. Mediastinum/Nodes: Mediastinal lymph nodes are not enlarged by CT size criteria. Hilar regions are difficult to evaluate without IV contrast. No axillary adenopathy. Esophagus is grossly unremarkable. Lungs/Pleura: Centrilobular and paraseptal emphysema with a large bullous lesion in the left lower lobe. Post biopsy changes in the right upper, right middle and right lower lobes. Superimposed basilar predominant subpleural reticulation, ground-glass and traction bronchiectasis/bronchiolectasis, likely stable from baseline examination of 07/08/2015 when differences in slice collimation are considered. There may be scattered honeycombing. Mild subpleural nodularity along the right major fissure is unchanged from 07/08/2015, indicative of subpleural lymph nodes. No pleural fluid. Airway is unremarkable. Upper Abdomen: Visualized portions of the liver and gallbladder are unremarkable. Mild nodular thickening of both adrenal glands. Visualized portion of the right kidney is unremarkable. 2.5 cm low-attenuation lesion in the left kidney is likely a cyst. Visualized portions of the spleen, pancreas, stomach and bowel are grossly unremarkable. No upper abdominal adenopathy. Musculoskeletal: No worrisome lytic or sclerotic lesions. Degenerative changes in the spine. IMPRESSION: 1. Pulmonary parenchymal pattern of fibrosis is likely stable from 07/08/2015 when differences in slice collimation are considered. Findings are in  keeping with the pathologic diagnosis of usual interstitial pneumonitis. 2.  Emphysema (ICD10-J43.9). Electronically Signed   By: Lorin Picket M.D.   On: 10/25/2016 08:23   OV 02/26/2018  Subjective:  Patient ID: Shaun [name], male , DOB: June 19, 1955 , age 50 y.o. , MRN: [id number] , ADDRESS: [place] [address]   02/26/2018 -   Chief Complaint  Patient presents with  . Follow-up    breathing well     HPI Shaun [name] 64 y.o. -follow-up familial IPF.  Last seen in May 2019.  After that in July 2019 he is screened for the electric face to inhaler study but he screen failed because he is standard of care CT scan of the chest from July 2018 was not per sponsored protocol or 2018 ATS protocol.  At this point in time he continues on nintedanib.  He is not taking proton pump inhibitor and wants to know if that is okay.  He feels he is stable.  There are no new issues.  The main thing is with nintedanib once a month he will have sporadic diarrhea.  He says he has been unable to control this but from a respiratory standpoint he feels he is stable.  There are no new medical issues.  He is up-to-date with his vaccines although immunization record review shows that he could benefit from Prevnar 13 which she has not had.  He continues to be actively engaged in the patient support group.   ROS - per HPI    OV 04/29/2018  Subjective:  Patient ID: Shaun [name], male , DOB: 1955/10/08 , age 36 y.o. , MRN: [id number] , ADDRESS: [place] [address]   04/29/2018 -   Chief Complaint  Patient presents with  . Follow-up    PFT performed today.  Pt states he is about the same since last visit.      HPI Shaun [name] 64 y.o. -returns for IPF follow-up.  He is on nintedanib.  He reports continued stability.  He still has  ongoing occasional issue with diarrhea that happened suddenly every few weeks.  It is so random.  It bothers him and does affect his  quality of life.  He takes Imodium in response.  We did discuss about taking Imodium proactively once a week.  He is now to give this a try.  He continues to be interested in pulmonary reserve study particularly face to inhaler study sponsored by [employer].  Our research department is getting ready and probably should be able to screen him in the next few to several weeks.  His last pulmonary function test was today and shows continued stability.  Last liver function test was November 2019 and was normal.  His walk test today is baseline     ROS   OV 11/29/2018  Subjective:  Patient ID: Shaun [name], male , DOB: 1956/03/04 , age 32 y.o. , MRN: [id number] , ADDRESS: [place] [address]   11/29/2018 -   Chief Complaint  Patient presents with  . IPF (idiopathic pulmonary fibrosis)    No issues, states medication is working for him.   Follow-up idiopathic pulmonary fibrosis on Nintendanib since November 2017  HPI Shaun [name] 64 y.o. -presents for follow-up of IPF.  Last visit was early in 2020.  And then the pandemic care.  He try to get down an inhaler trial of phase 2 for IPF - [place] but he screen failed because of technical issues with the spirometry.  He says overall he is doing well.  He is just following low risk activities.  He has a christening coming up for his grandson.  This will be outdoors in [place].  A total of 12 people will attend.  Everybody will be Masked.  He continues taking nintedanib and does not have any issues tolerating it.  Last liver function test was over 6 months ago.  He needs another one now.  He is also asking for a flu shot.  His symptom score and walking desaturation test showed continued stability.  OV 07/15/2019  Subjective:  Patient ID: Shaun [name], male , DOB: May 04, 1955 , age 12 y.o. , MRN: [id number] , ADDRESS: [place] [address]   07/15/2019 -   Chief Complaint  Patient presents with  .  Follow-up    Follow-up idiopathic pulmonary fibrosis on Nintendanib since November 2017   HPI Shaun [name] 64 y.o. -presents for IPF follow-up.  Last seen August 2020.  This visit was supposed to have a pulmonary function test but this was not scheduled.  Patient overall feels stable.  Dyspnea is stable and minimal.  He walks on a regular basis 45 minutes.  He tolerates nintedanib but has mild symptoms.  He is asking about going to a lower dose but tells me that if the drug has been beneficial he says he can manage with the current symptoms.  He says he has had the COVID-19 vaccine at this point.  Last second dose was 1 week ago.  He is in the IPF pro registry.  He is not on any interventional trial.  He screen fell for an inhaler trial but that has been withdrawn because of safety reasons.  He was never a participant in the trial.  He is interested in other trials.  He reported that he is going to be 64 years old and will become a Medicare recipient in the next several months.  He is going to look for insurance options.  Walking desaturation  test was stable.  He is okay waiting till the next visit for spirometry.    SYMPTOM SCALE - ILD 11/29/2018  07/15/2019   O2 use Room air   Shortness of Breath 0 -> 5 scale with 5 being worst (score 6 If unable to do)   At rest 0 0  Simple tasks - showers, clothes change, eating, shaving 0 0  Household (dishes, doing bed, laundry) 0 0  Shopping 0 0  Walking keeping up with others of same age 30 0  Walking up Stairs 1 1  Total (40 - 48) Dyspnea Score 1 1  How bad is your cough? 0 0  How bad is your fatigue 0 1  nausea  1  vomit  0  diarhea  1  anxity  1  depresion  1        Simple office walk - 250 feet x 3 laps 02/26/2018  04/29/2018  11/29/2018  07/15/2019   O2 used Room air Room air Room air ra  Number laps completed _0 Comments about pace Good brisk     Resting Pulse Ox/HR 100% and 74/min 100% and 86/min     100% and 76/min  100%and 79/min  Final Pulse Ox/HR 96% and 96/min 97% and 93/min 96% and 99/min 96% and 111/min  Desaturated </= 88% no no no no  Desaturated <= 3% points yes Yes, 3 Yes, 4 Yes, 4 points  Got Tachycardic >/= 90/min yes yes yes yes  Symptoms at end of test No dyspnea No dyspnea  asympt  Miscellaneous comments x x       Results for MILLS, MITTON (MRN [id number]) as of 11/29/2018 09:56  Ref. Range 08/02/2015 10:41 10/25/2016 08:44 02/26/2017 09:06 05/22/2017 08:59 08/14/2017 12:41 10/19/2017 12:23 04/29/2018 10:47 05/21/2018 10:11 07/04/2018 08:59  FVC-Pre Latest Units: L 4.00 4.16 4.04 3.91 4.11  4.16  4.12  FVC-%Pred-Pre Latest Units: % 93 98 95 92 97  98  102  Results for CERVANDO, DURNIN (MRN [id number]) as of 11/29/2018 09:56  Ref. Range 08/02/2015 10:41 10/25/2016 08:44 02/26/2017 09:06 05/22/2017 08:59 08/14/2017 12:41 10/19/2017 12:23 04/29/2018 10:47 05/21/2018 10:11 07/04/2018 08:59  DLCO unc Latest Units: ml/min/mmHg 17.33 17.25 14.23 15.14 14.42 13.93 14.84 15.39 14.04  DLCO unc % pred Latest Units: % 61 60 50 53 50 49 52 64 58       ROS - per HPI     has a past medical history of Dyspnea, Family history of colon cancer, Family history of prostate cancer, Family history of pulmonary fibrosis, Hyperlipemia, and Pneumonia.   reports that he has never smoked. He has never used smokeless tobacco.  Past Surgical History:  Procedure Laterality Date  . COLONOSCOPY WITH PROPOFOL N/A 09/21/2014   Procedure: COLONOSCOPY WITH PROPOFOL;  Surgeon: Garlan Fair, MD;  Location: [facility];  Service: Endoscopy;  Laterality: N/A;  . FOREARM SURGERY     excision of birth mark"child"  . HERNIA REPAIR     RIH-open  . KNEE ARTHROTOMY Left    bursa excision  . LUNG BIOPSY Right 02/02/2016   Procedure: LUNG BIOPSY;  Surgeon: [name], MD;  Location: [facility];  Service: Thoracic;  Laterality: Right;  . NASAL SEPTUM SURGERY    . VIDEO ASSISTED THORACOSCOPY Right 02/02/2016   Procedure: VIDEO  ASSISTED THORACOSCOPY;  Surgeon: [name], MD;  Location: [facility];  Service: Thoracic;  Laterality: Right;  . WRIST GANGLION EXCISION Left  Allergies  Allergen Reactions  . Penicillins Other (See Comments)    UNSPECIFIED REACTION OF CHILDHOOD Has patient had a PCN reaction causing immediate rash, facial/tongue/throat swelling, SOB or lightheadedness with hypotension:unsure Has patient had a PCN reaction causing severe rash involving mucus membranes or skin necrosis:unsure Has patient had a PCN reaction that required hospitalization:No Has patient had a PCN reaction occurring within the last 10 years:No If all of the above answers are "NO", then may proceed with Cephalosporin use.     Immunization History  Administered Date(s) Administered  . Influenza,inj,Quad PF,6+ Mos 12/09/2016, 11/26/2017, 11/29/2018  . Influenza-Unspecified 11/02/2015, 12/09/2016  . Moderna SARS-COVID-2 Vaccination 06/05/2019, 07/08/2019  . Pneumococcal Conjugate-13 02/26/2018  . Pneumococcal Polysaccharide-23 02/05/2016  . Zoster 11/02/2015  . Zoster Recombinat (Shingrix) 12/09/2016, 02/10/2017    Family History  Problem Relation Age of Onset  . Lung cancer Mother   . Colon cancer Mother 27  . Pulmonary fibrosis Father        dx in his 63s  . Prostate cancer Brother 86  . COPD Paternal Grandfather   . Other Paternal Uncle        possible lung dx; died around age 12  . Other Cousin        possible lung dx, died in her 33s; paternal first cousin  . Healthy Son      Current Outpatient Medications:  .  albuterol (PROVENTIL HFA;VENTOLIN HFA) 108 (90 Base) MCG/ACT inhaler, Inhale 2 puffs into the lungs every 6 (six) hours as needed for wheezing or shortness of breath., Disp: 1 Inhaler, Rfl: 5 .  aspirin EC 81 MG tablet, Take 81 mg by mouth daily., Disp: , Rfl:  .  Chlorpheniramine-Pseudoeph 4-60 MG TABS, Take 1 tablet by mouth 2 (two) times daily as needed (for allergies.)., Disp: , Rfl:  .   Multiple Vitamin (MULTIVITAMIN WITH MINERALS) TABS tablet, Take 1 tablet by mouth daily., Disp: , Rfl:  .  OFEV 150 MG CAPS, TAKE 1 CAPSULE BY MOUTH TWICE DAILY 12 HOURS APART WITH FOOD, Disp: 60 capsule, Rfl: 11 .  simvastatin (ZOCOR) 10 MG tablet, Take 10 mg by mouth daily at 6 PM. , Disp: , Rfl: 0      Objective:   Vitals:   07/15/19 0935  BP: 128/68  Pulse: 79  Temp: (!) 97.2 F (36.2 C)  TempSrc: Temporal  SpO2: 100%  Weight: 142 lb 12.8 oz (64.8 kg)  Height: _0  (1.702 m)    Estimated body mass index is 22.37 kg/m as calculated from the following:   Height as of this encounter: _1  (1.702 m).   Weight as of this encounter: 142 lb 12.8 oz (64.8 kg).  _2 @  [employer]   07/15/19 0935  Weight: 142 lb 12.8 oz (64.8 kg)     Physical Exam Bilateral bibasal crackles otherwise nonfocal exam.  Alert and oriented x3 normal heart sounds.        Assessment:       ICD-10-CM   1. IPF (idiopathic pulmonary fibrosis) (HCC)  U98.119 Pulmonary Function Test    Hepatic function panel  2. Familial idiopathic pulmonary fibrosis ([place])  J84.112   3. Encounter for therapeutic drug monitoring  Z51.81 Hepatic function panel       Plan:     Patient Instructions      ICD-10-CM   1. IPF (idiopathic pulmonary fibrosis) ([place])  J84.112   2. Familial idiopathic pulmonary fibrosis ([place])  J84.112   3. Encounter for therapeutic drug  monitoring  Z51.81      Stable IPF on Nintedanib   Plan  - continue Nintednaib (Ofev) as before - check LFT 07/15/2019 -  - complete IPF PRO registry study questions and do blood work for [employer] - we discussed trials as care option- in future can look at what trials might be available; currently none  Followup  - 4 months do spirometry and dlco  - return to clinic (30 min slot) for followup in 4 months - return sooner if needed     SIGNATURE    Dr. Brand Males, M.D., F.C.C.P,  Pulmonary and Critical Care  Medicine Staff Physician, [place] Director - Interstitial Lung Disease  Program  Pulmonary [place] [facility] [place], [place], [address]  Pager: [phone], If no answer or between  15:00h - 7:00h: call [phone] Telephone: [phone]  10:19 AM 07/15/2019

## 2019-07-15 NOTE — Progress Notes (Signed)
Lab             07/15/19                      1023         AST          26           ALT          13           ALKPHOS      57           BILITOT      0.5          PROT         6.2          ALBUMIN      4.0           LFT normal

## 2019-07-15 NOTE — Progress Notes (Signed)
Lab             07/15/19                      1023         AST          26           ALT          13           ALKPHOS      57           BILITOT      0.5          PROT         6.2          ALBUMIN      4.0           LFT Normal

## 2019-07-15 NOTE — Patient Instructions (Addendum)
ICD-10-CM   1. IPF (idiopathic pulmonary fibrosis) ([place])  J84.112   2. Familial idiopathic pulmonary fibrosis ([place])  J84.112   3. Encounter for therapeutic drug monitoring  Z51.81      Stable IPF on Nintedanib   Plan  - continue Nintednaib (Ofev) as before - check LFT 07/15/2019 -  - complete IPF PRO registry study questions and do blood work for [employer] - we discussed trials as care option- in future can look at what trials might be available; currently none  Followup  - 4 months do spirometry and dlco  - return to clinic (30 min slot) for followup in 4 months - return sooner if needed

## 2019-07-15 NOTE — Research (Signed)
IPF-PRO Registry  Purpose: To collect data and biological samples that will support future research studies.  The goal of the registry is to research the current approaches to diagnosis, treatment, and progression of IPF. In addition, the registry will analyze participant characteristics, assess quality of life, describe participants interactions with the health care system, determine IPF treatment practices across multiple institutions, and utilize biological samples to identify disease biomarkers.   Clinical Research Coordinator / Research RN note : This visit for [name] with DOB: [birth year] on 07/15/2019 for the above protocol is Visit/Encounter # 48 Month and is for purpose of research. The consent for this encounter is under Protocol Version Amendment 4 (Version Date:13 December 2017)  and is currently IRB approved. Subject expressed continued interest and consent in continuing as a study subject. Subject confirmed that there was no change in contact information (e.g. address, telephone, email). Subject thanked for participation in research and contribution to science.   During this visit on 07/15/2019, the subject completed the blood work and questionnaires per the above referenced protocol. Please refer to the subject's paper source binder for further details.   Signed by  [place] Assistant [employer]  [place], [place] 11:17 AM 07/15/2019

## 2019-08-12 DIAGNOSIS — Z125 Encounter for screening for malignant neoplasm of prostate: Secondary | ICD-10-CM | POA: Diagnosis not present

## 2019-08-12 DIAGNOSIS — R7301 Impaired fasting glucose: Secondary | ICD-10-CM | POA: Diagnosis not present

## 2019-08-12 DIAGNOSIS — Z0000 Encounter for general adult medical examination without abnormal findings: Secondary | ICD-10-CM | POA: Diagnosis not present

## 2019-08-12 DIAGNOSIS — E7800 Pure hypercholesterolemia, unspecified: Secondary | ICD-10-CM | POA: Diagnosis not present

## 2019-08-25 ENCOUNTER — Telehealth: Payer: Self-pay | Admitting: Internal Medicine

## 2019-08-25 NOTE — Telephone Encounter (Signed)
May leave message on the [phone] number.

## 2019-08-25 NOTE — Telephone Encounter (Signed)
Pt returned call , please call back   °

## 2019-08-25 NOTE — Telephone Encounter (Signed)
Left a voicemail for patient to call our office back regarding his prior message.

## 2019-08-25 NOTE — Telephone Encounter (Signed)
Pt returning call regarding Ofev paperwork.  [phone]

## 2019-08-26 NOTE — Telephone Encounter (Signed)
Paperwork has been located and is up in MR's office waiting on signature from him.  Once paperwork has been signed by him, will update message.  Since Shaun [name] is the one currently working with MR, routing this message to her for her to document once paperwork has been signed by him.

## 2019-08-29 NOTE — Telephone Encounter (Signed)
I believe I signed it.  Please check on my desk on the side table.  If it is not there which I do not think it is because I remember giving it to either Jonelle Sidle or [place] 48 hours ago.  There is a small possibility that I gave it to [place] yesterday

## 2019-08-29 NOTE — Telephone Encounter (Signed)
MR, please advise if you signed the OFEV pt assistance paperwork on pt and if you did, whom did you give it to afterwards?

## 2019-08-29 NOTE — Telephone Encounter (Signed)
I have the Ofev paperwork. Will finish filling out the missing parts and fax to [facility]. Will place forms back in MR's folder in B Pod.

## 2019-09-02 NOTE — Telephone Encounter (Signed)
Paperwork was filled out and signed by MR and was faxed to [facility] 08/29/19. Nothing further needed.

## 2019-10-11 ENCOUNTER — Other Ambulatory Visit (HOSPITAL_COMMUNITY): Payer: BC Managed Care – PPO

## 2019-10-14 ENCOUNTER — Other Ambulatory Visit: Payer: Self-pay

## 2019-10-14 ENCOUNTER — Ambulatory Visit (INDEPENDENT_AMBULATORY_CARE_PROVIDER_SITE_OTHER): Payer: BC Managed Care – PPO | Admitting: Internal Medicine

## 2019-10-14 DIAGNOSIS — J84112 Idiopathic pulmonary fibrosis: Secondary | ICD-10-CM

## 2019-10-14 LAB — PULMONARY FUNCTION TEST
DL/VA % pred: 61 %
DL/VA: 2.6 ml/min/mmHg/L
DLCO cor % pred: 51 %
DLCO cor: 12.16 ml/min/mmHg
DLCO unc % pred: 51 %
DLCO unc: 12.16 ml/min/mmHg
FEF 25-75 Pre: 4.79 L/sec
FEF2575-%Pred-Pre: 198 %
FEV1-%Pred-Pre: 119 %
FEV1-Pre: 3.57 L
FEV1FVC-%Pred-Pre: 117 %
FEV6-%Pred-Pre: 107 %
FEV6-Pre: 4.06 L
FEV6FVC-%Pred-Pre: 105 %
FVC-%Pred-Pre: 102 %
FVC-Pre: 4.07 L
Pre FEV1/FVC ratio: 88 %
Pre FEV6/FVC Ratio: 100 %

## 2019-10-14 NOTE — Progress Notes (Signed)
Spirometry and dlco done today.

## 2019-11-16 NOTE — Progress Notes (Signed)
PFT mid July stable/mild decline over some years. He has IPF  Plan  - repet spiro/dlco in mid oct 2021 and see me for followup 30 min slot then (any day). OCt schedule will be out soon

## 2019-11-17 ENCOUNTER — Telehealth: Payer: Self-pay | Admitting: Internal Medicine

## 2019-11-18 ENCOUNTER — Other Ambulatory Visit: Payer: Self-pay | Admitting: *Deleted

## 2019-11-18 DIAGNOSIS — J84112 Idiopathic pulmonary fibrosis: Secondary | ICD-10-CM

## 2019-11-18 NOTE — Telephone Encounter (Signed)
Spoke with pt letting him know the results of the PFT and he verbalized understanding. Pt has been scheduled for PFT in Oct 2021 and recall has been placed for f/u with MR after PFT in Oct 2021. Nothing further needed.

## 2019-11-18 NOTE — Telephone Encounter (Signed)
Pt returning a phone call Pt can be reached at [phone]

## 2019-12-16 DIAGNOSIS — Z23 Encounter for immunization: Secondary | ICD-10-CM | POA: Diagnosis not present

## 2020-01-13 ENCOUNTER — Other Ambulatory Visit: Payer: Self-pay

## 2020-01-13 ENCOUNTER — Ambulatory Visit (INDEPENDENT_AMBULATORY_CARE_PROVIDER_SITE_OTHER): Payer: BC Managed Care – PPO | Admitting: Internal Medicine

## 2020-01-13 ENCOUNTER — Encounter: Payer: BC Managed Care – PPO | Admitting: *Deleted

## 2020-01-13 DIAGNOSIS — J84112 Idiopathic pulmonary fibrosis: Secondary | ICD-10-CM

## 2020-01-13 LAB — PULMONARY FUNCTION TEST
DL/VA % pred: 65 %
DL/VA: 2.78 ml/min/mmHg/L
DLCO cor % pred: 57 %
DLCO cor: 13.56 ml/min/mmHg
DLCO unc % pred: 57 %
DLCO unc: 13.56 ml/min/mmHg
FEF 25-75 Pre: 4.71 L/sec
FEF2575-%Pred-Pre: 195 %
FEV1-%Pred-Pre: 119 %
FEV1-Pre: 3.55 L
FEV1FVC-%Pred-Pre: 120 %
FEV6-%Pred-Pre: 104 %
FEV6-Pre: 3.95 L
FEV6FVC-%Pred-Pre: 105 %
FVC-%Pred-Pre: 99 %
FVC-Pre: 3.95 L
Pre FEV1/FVC ratio: 90 %
Pre FEV6/FVC Ratio: 100 %

## 2020-01-13 NOTE — Progress Notes (Signed)
Spirometry and Dlco done today. 

## 2020-01-13 NOTE — Research (Signed)
IPF-PRO Registry  Purpose: To collect data and biological samples that will support future research studies.  The goal of the registry is to research the current approaches to diagnosis, treatment, and progression of IPF. In addition, the registry will analyze participant characteristics, assess quality of life, describe participants interactions with the health care system, determine IPF treatment practices across multiple institutions, and utilize biological samples to identify disease biomarkers.   Clinical Research Coordinator / Research RN note : This visit for [name] with DOB: 11/30/1955 on 01/13/2020 for the above protocol is Visit/Encounter # 53 Month and is for purpose of research. The consent for this encounter is under Protocol Version Amendment 4 (Version Date:13 December 2017)  and is currently IRB approved. Subject expressed continued interest and consent in continuing as a study subject. Subject confirmed that there was no change in contact information (e.g. address, telephone, email). Subject thanked for participation in research and contribution to science.   During this visit on 01/13/2020, the subject completed the blood work and questionnaires per the above referenced protocol. Please refer to the subject's paper source binder for further details.   Signed by  [place], [place] 10:27 AM 01/13/2020

## 2020-01-14 ENCOUNTER — Encounter: Payer: Self-pay | Admitting: Internal Medicine

## 2020-01-14 ENCOUNTER — Ambulatory Visit: Payer: BC Managed Care – PPO | Admitting: Internal Medicine

## 2020-01-14 ENCOUNTER — Other Ambulatory Visit: Payer: Self-pay

## 2020-01-14 VITALS — BP 114/70 | HR 77 | Ht 67.0 in | Wt 138.8 lb

## 2020-01-14 DIAGNOSIS — Z5181 Encounter for therapeutic drug level monitoring: Secondary | ICD-10-CM

## 2020-01-14 DIAGNOSIS — J84112 Idiopathic pulmonary fibrosis: Secondary | ICD-10-CM | POA: Diagnosis not present

## 2020-01-14 LAB — HEPATIC FUNCTION PANEL
ALT: 11 U/L (ref 0–53)
AST: 21 U/L (ref 0–37)
Albumin: 4 g/dL (ref 3.5–5.2)
Alkaline Phosphatase: 54 U/L (ref 39–117)
Bilirubin, Direct: 0.1 mg/dL (ref 0.0–0.3)
Total Bilirubin: 0.5 mg/dL (ref 0.2–1.2)
Total Protein: 6.4 g/dL (ref 6.0–8.3)

## 2020-01-14 NOTE — Progress Notes (Signed)
LFT normal onn ofev. Will not be calling with normal result

## 2020-01-14 NOTE — Addendum Note (Signed)
Addended by: Suzzanne Cloud E on: 01/14/2020 02:11 PM   Modules accepted: Orders

## 2020-01-14 NOTE — Patient Instructions (Addendum)
ICD-10-CM   1. IPF (idiopathic pulmonary fibrosis) ([place])  J84.112   2. Encounter for therapeutic drug monitoring  Z51.81   3. Familial idiopathic pulmonary fibrosis ([facility])  J84.112       Stable IPF on Nintedanib but slightly progressive slowly over time since 2017 and some since 2020  Mild to moderate diarrhea from ofev but weight stable  Thanks for participating in the IPF-pro registry study  Plan  - continue Nintednaib [facility]) as before  - do not recommend that you reduce the nintedanib but on the other hand I recommend that you manage the diarrhea  -Regarding diarrhea: Cut down all sugary foods and artificial sugar substitute and soft drinks to the extent possible  -Regarding diarrhea: Also try Imodium on a once a week or twice a week scheduled basis in  - check LFT today   - we discussed trials as care option-I will have Katherine Mantle email you consent form for STARSCAPE study.  We can consider TETON inhaler study in future. Understand you are NOT interested in pliant study  Followup  - 5 months do spirometry and dlco  - return to clinic (30 min slot) for followup in 5 months - return sooner if needed

## 2020-01-14 NOTE — Addendum Note (Signed)
Addended by: Lorretta Harp on: 01/14/2020 02:10 PM   Modules accepted: Orders

## 2020-01-14 NOTE — Progress Notes (Signed)
OV 02/22/2016  Chief Complaint  Patient presents with  . Pulmonary Consult    Pt referred by Dr.Byrum for ILD. Pt states he feels he is still recovering from the lung biopsy. Pt denies cough and CP/tightness.     2nd opinionand transfer of care for ILD/UIP - IPF from DR Byrum. Presents with his wife. They related to Dr. Stanford Breed cardiologist   64 year old male found to have crackles on physical exam and subsequently surgical lung biopsy 02/02/2016 showed UIP as read by pathologist in [place].  [employer] of chest physicians interstitial lung disease questionnaire  Symptoms: He does not cough and is not troubled by shortness of breath except with strenuous exercise. Onset is only "recently" Past medical history: Positive for chronic sinus drainage mild. Recently after surgical lung biopsy was being a few bone unintentional weight loss. Otherwise negative Personal exposure history: He smoked occasional cigarettes having grown up on her tobacco farm starting at 50 including when he was age 25.  Family history: All the male members in his family who smoke developed COPD but also did not smoke to the pulmonary fibrosis especially his father and his paternal uncles. His dad was diagnosed with pulmonary fibrosis at age of 7 in 2001/05/11 and died from progressive hypoxemic respiratory failure after what sounds like an IPF flare in 11-May-2005.  Home exposure history: He is lived in the same house since 05-12-87. The home does not have a humidifier or sound or hot tub or Jacuzzi. He is not exposed to any birds. 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 Approximately 30 years of work ending 4 years ago used to cut through dry walls and was exposed to concrete dust and drywall dust and possibly asbestos. HE is to  cut through insulation safe. 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 Did not work in a minor quarry a Teacher, music on Agricultural engineer. Denies any exposure to birds of feathers  Autoimmune history: Is negative.  History of exposure to pulmonary toxic drugs: Negative   Lab work - April 2017 and November 2017 extensive autoimmune and vascular this panel is negative  - High resolution CT scan of the chest August 2017: UIP pattern that is classic - personally visualized. There is also 5 mm right lower lobe lung nodule and a 5.9 cm left-sided lower bulla [stable compared to April 2017]   Severity - Pulmonary function test 08/02/2015: Isolated reduction in diffusion capacity 17.33/61%. Otherwise FVC 4 L/93% and normal. Total lung capacity 5.9 L/91% and normal - Walking desaturation test 185 feet 3 laps on room air in the office 02/22/2016: Did not desaturate but gets symptomatic   Emotional state: He is extremely worried about the diagnosis of IPF. This is particularly so because he has a strong family history and his father died within 8 years usually was diagnosed much order at age 88. Patient is worried that he got diagnosed with the same problem and much younger age of 56 and feels that he only will have a few years to live. Multiple times during the visit he was teary-eyed   OV 07/21/2016  Chief Complaint  Patient presents with  . Follow-up  Pt denies change in breathing since last OV. Pt denies cough and SOB and CP/tightness.      Follow-up idiopathic pulmonary fibrosis familial variety  He presents with his wife. Overall he is stable. 4 routine activities of daily living and mowing the yard he does not have any shortness of breath or cough. He did find the mountains recently been quite dyspneic. This is worse than June 2017 with for the diagnosis of IPF. Overall he is tolerating the Ofev just  fine except occasionally he has some GI discomfort.He is questions about research trials, exercise, pulmonary fibrosis foundation and family genetics.he is quite active and diffuse pulmonary rehabilitation is easy for him. He is willing to go to [facility] and work on aerobic strengthening and muscle conditioning  Notice that she's not had pulmonary function testing since May 2017    has a past medical history of Dyspnea; Hyperlipemia; and Pneumonia.   reports that he has never smoked. He has never used smokeless tobacco.  OV 10/25/2016  Chief Complaint  Patient presents with  . Follow-up    Pt here after PFT. Pt states his breathing is unchanged. Pt c/o PND and dry cough. Pt denies f/c/s and CP/tightenss.    Follow-up mild idiopathic pulmonary fibrosis he's on Ofev since  NOv 2017  Here with his wife. Overall he is stable. His pulmonary function tests and CT scan of the chest shows continued stability for 1 year. Symptom-wise also he is stable. His dyspnea when doing mountains. He is kayaking and biking and is able to outpace his wife. During this time he does not check his pulse oximetry. He does not have one. I've advised that he can get one. He is asking about familial issues, research and also prognosis we went over all this. Of note CT scan of the chest shows also he has concomitant emphysema. He prefers to avoid medications but he is interested in taking an inhaler if it means improving his dyspnea. He is active with pulmonary fibrosis foundation patient support groupo. Of note he reports easy bruising in his forearm when he bumps into something. His wife thinks it's been there all along and is unchanged. He is wondering if that this is related to Ofev and asa. We did discuss about the bleeding risk of Ofev and the need to hold this before any procedure. Overall he tolerates Ofev just fine except for occasional diarrhea once a month. This is associated with fiber food    OV   02/26/2017  Chief Complaint  Patient presents with  . Follow-up    PFT done today. Pt still taking OFEV and doing good on it. Denies any complaints with cough, SOB, or CP.    Follow-up idiopathic pulmonary fibrosis on Nintendanib since November 2017  This time his wife is not here with him.  He has now been on nintedanib for a year.  Overall he is tolerating it fine except for mild occasional diarrhea.  He continues to be very active working out in the yard and doing daily walks with a few miles.  He notices only mild dyspnea with this relieved by rest.  He does not feel his health has declined in the last 1 year.  He does have concomitant emphysema on the CT scan but not apparent on the PFTs.  I asked him to try Spiriva last time but it did not help.  He is dissatisfied with occasional albuterol use.  He did have pulmonary function test today which is documented  below.  The FVC is stable but there is a change in the DLCO but he is not feeling it.  He is on IPF registry by [facility];  he is due for a study visit today.  He is interested in further studies especially [place].  We discussed this in detail brief.  He has had genetics testing he got the result.  He is worried insurance will deny this.  He is participating in the pulmonary fibrosis foundation patient support group.  He is asking details about the March 17, 2017 local [place]   OV 05/22/2017  Chief Complaint  Patient presents with  . Follow-up    PFT done today.  Pt stated he had the flu 05/05/17 and was not able to eat on 05/06/17 so he missed two OFEV doses. States he has had diarrhea a couple times since then but is now doing fine with help from immodium.  States his breathing has been doing good.      Follow-up idiopathic pulmonary fibrosis on Nintendanib since November 5664   64 year old male with idiopathic pulmonary fibrosis.  He is on stable dose of nintedanib.  He tells me earlier this month he had stomach flu and that he  suddenly had subjective sense of fever associated with diarrhea and vomiting.  This resolved in 2 days.  Since then he is lost a couple of pounds of weight.  He is worried about tolerating the nintedanib.  In addition primary function test shows a 3% decline in FVC and 2 years and a 12% decline in DLCO in 2 years.  He is now wondering if the drug is effective.  Overall other than the recent illness and occasional mild diarrhea he is tolerated off of just fine.  But he is now worried about the tolerance.  He has been advised by [facility] nursing support team about taking protein with nintedanib to offset side effects and he has questions about this.   OV 08/14/2017  Chief Complaint  Patient presents with  . Follow-up    PFT done today. Pt states he has been doing good since last visit and denies any complaints.     Follow-up idiopathic pulmonary fibrosis surgical lung biopsy diagnosis February 02, 2016 on Nintendanib since November 2017  Mr. Reser continues to do well.  At the time of last visit there was concern about weight loss and also progression in lung function.  However at that time he had a respiratory infection.  Today he comes back and tells me that he has gained 1 pound of weight.  His respiratory symptoms have resolved he is back to being at a stable mild exertional dyspnea.  He is extremely active.  Concordant with his symptom improvement his FVC has improved and his DLCO is stable.  Overall his pulmonary function test is stable since November 2018 at least with a DLCO although the DLCO has progressed since 2017.  His FVC has been stable all along since May 2017.  He is not interested in the [place] study because of the oral pill and can make his diarrhea potentially worse if he gets randomized to the drug.  He is interested in an upcoming inhaler trial of IPF.  He is active in the support group and is asking about the next meeting.  He is not on oxygen.        Ct Chest High  Resolution  Result Date: 10/25/2016 CLINICAL DATA:  Interstitial lung disease, former smoker, cough. Lung biopsy November 2017. EXAM:  CT CHEST WITHOUT CONTRAST TECHNIQUE: Multidetector CT imaging of the chest was performed following the standard protocol without intravenous contrast. High resolution imaging of the lungs, as well as inspiratory and expiratory imaging, was performed. COMPARISON:  11/23/2015 and 07/08/2015. FINDINGS: Cardiovascular: Vascular structures are unremarkable. Heart size normal. No pericardial effusion. Mediastinum/Nodes: Mediastinal lymph nodes are not enlarged by CT size criteria. Hilar regions are difficult to evaluate without IV contrast. No axillary adenopathy. Esophagus is grossly unremarkable. Lungs/Pleura: Centrilobular and paraseptal emphysema with a large bullous lesion in the left lower lobe. Post biopsy changes in the right upper, right middle and right lower lobes. Superimposed basilar predominant subpleural reticulation, ground-glass and traction bronchiectasis/bronchiolectasis, likely stable from baseline examination of 07/08/2015 when differences in slice collimation are considered. There may be scattered honeycombing. Mild subpleural nodularity along the right major fissure is unchanged from 07/08/2015, indicative of subpleural lymph nodes. No pleural fluid. Airway is unremarkable. Upper Abdomen: Visualized portions of the liver and gallbladder are unremarkable. Mild nodular thickening of both adrenal glands. Visualized portion of the right kidney is unremarkable. 2.5 cm low-attenuation lesion in the left kidney is likely a cyst. Visualized portions of the spleen, pancreas, stomach and bowel are grossly unremarkable. No upper abdominal adenopathy. Musculoskeletal: No worrisome lytic or sclerotic lesions. Degenerative changes in the spine. IMPRESSION: 1. Pulmonary parenchymal pattern of fibrosis is likely stable from 07/08/2015 when differences in slice collimation are  considered. Findings are in keeping with the pathologic diagnosis of usual interstitial pneumonitis. 2.  Emphysema (ICD10-J43.9). Electronically Signed   By: Lorin Picket M.D.   On: 10/25/2016 08:23   OV 02/26/2018  Subjective:  Patient ID: Shaun [name], male , DOB: 1955/08/01 , age 65 y.o. , MRN: [id number] , ADDRESS: [place] [address]   02/26/2018 -   Chief Complaint  Patient presents with  . Follow-up    breathing well     HPI MABRY TIFT 64 y.o. -follow-up familial IPF.  Last seen in May 2019.  After that in July 2019 he is screened for the electric face to inhaler study but he screen failed because he is standard of care CT scan of the chest from July 2018 was not per sponsored protocol or 2018 ATS protocol.  At this point in time he continues on nintedanib.  He is not taking proton pump inhibitor and wants to know if that is okay.  He feels he is stable.  There are no new issues.  The main thing is with nintedanib once a month he will have sporadic diarrhea.  He says he has been unable to control this but from a respiratory standpoint he feels he is stable.  There are no new medical issues.  He is up-to-date with his vaccines although immunization record review shows that he could benefit from Prevnar 13 which she has not had.  He continues to be actively engaged in the patient support group.   ROS - per HPI    OV 04/29/2018  Subjective:  Patient ID: Shaun [name], male , DOB: 1956-01-01 , age 68 y.o. , MRN: [id number] , ADDRESS: [place] [id number]   04/29/2018 -   Chief Complaint  Patient presents with  . Follow-up    PFT performed today.  Pt states he is about the same since last visit.      HPI ALUCARD FEARNOW 64 y.o. -returns for IPF follow-up.  He is on nintedanib.  He reports continued stability.  He  still has ongoing occasional issue with diarrhea that happened suddenly every few weeks.  It is so random.  It  bothers him and does affect his quality of life.  He takes Imodium in response.  We did discuss about taking Imodium proactively once a week.  He is now to give this a try.  He continues to be interested in pulmonary reserve study particularly face to inhaler study sponsored by [employer].  Our research department is getting ready and probably should be able to screen him in the next few to several weeks.  His last pulmonary function test was today and shows continued stability.  Last liver function test was November 2019 and was normal.  His walk test today is baseline     ROS   OV 11/29/2018  Subjective:  Patient ID: Shaun [name], male , DOB: 04-20-1955 , age 43 y.o. , MRN: [id number] , ADDRESS: [place] [address]   11/29/2018 -   Chief Complaint  Patient presents with  . IPF (idiopathic pulmonary fibrosis)    No issues, states medication is working for him.   Follow-up idiopathic pulmonary fibrosis on Nintendanib since November 2017  HPI PAOLO OKANE 64 y.o. -presents for follow-up of IPF.  Last visit was early in 2020.  And then the pandemic care.  He try to get down an inhaler trial of phase 2 for IPF - [place] but he screen failed because of technical issues with the spirometry.  He says overall he is doing well.  He is just following low risk activities.  He has a christening coming up for his grandson.  This will be outdoors in [place].  A total of 12 people will attend.  Everybody will be Masked.  He continues taking nintedanib and does not have any issues tolerating it.  Last liver function test was over 6 months ago.  He needs another one now.  He is also asking for a flu shot.  His symptom score and walking desaturation test showed continued stability.  OV 07/15/2019  Subjective:  Patient ID: Shaun [name], male , DOB: [birth year] , age 75 y.o. , MRN: [id number] , ADDRESS: [place] [address]   07/15/2019 -   Chief Complaint   Patient presents with  . Follow-up    Follow-up idiopathic pulmonary fibrosis on Nintendanib since November 2017   HPI CHASTIN RIESGO 64 y.o. -presents for IPF follow-up.  Last seen August 2020.  This visit was supposed to have a pulmonary function test but this was not scheduled.  Patient overall feels stable.  Dyspnea is stable and minimal.  He walks on a regular basis 45 minutes.  He tolerates nintedanib but has mild symptoms.  He is asking about going to a lower dose but tells me that if the drug has been beneficial he says he can manage with the current symptoms.  He says he has had the COVID-19 vaccine at this point.  Last second dose was 1 week ago.  He is in the IPF pro registry.  He is not on any interventional trial.  He screen fell for an inhaler trial but that has been withdrawn because of safety reasons.  He was never a participant in the trial.  He is interested in other trials.  He reported that he is going to be 64 years old and will become a Medicare recipient in the next several months.  He is going to look for insurance options.  Walking desaturation test was stable.  He is okay waiting till the next visit for spirometry.     OV 01/14/2020   Subjective:  Patient ID: Shaun [name], male , DOB: [birth year], age 40 y.o. years. , MRN: [id number],  ADDRESS: [place] [address] PCP  Mayra Neer, MD Providers : Treatment Team:  Attending Provider: Brand Males, MD Patient Care Team: Mayra Neer, MD as PCP - General (Family Medicine)    Chief Complaint  Patient presents with  . Follow-up    PFT performed 10/12.  Pt states he has been doing okay since last visit. States that he is still having problems with diarrhea which he believes has become worse.    IPF diagnosed in November 2017.   HPI ISSA KOSMICKI 64 y.o. -returns for follow-up of his pulmonary fibrosis IPF.  He is coming up for years since diagnosis.  Overall he reports  that the stability in his symptoms see below.  He tells me that when he goes for a 35/45-minute walk he has difficulty with inclines.  He did say this is overall stable but may be compared to a year or 2 ago it is slightly worse.  Review of his pulmonary function test shows compared to 2017 there are some decline in both FVC and DLCO and compared to early 2020 there is slight decline in DLCO although compared to most recent numbers his DLCO is better.  His weight is stable.  Overall he is tolerating the nintedanib fine but he is having diarrhea.  The diarrhea is uncomfortable but nevertheless is only 2 episodes a week.  Last visit I advised him to take Imodium once a week on a scheduled basis but he says he prefers not to take medicine and he takes it as needed.  If this is then restarting to him using Imodium twice a week.  We discussed and is willing to try once a week schedule.  The other alternative he wanted to discuss was reducing the nintedanib to 100 mg twice daily.  I cautioned against this given the fact his lungs have had only a slight progression nevertheless there is progression and with the lower dose of nintedanib may be it is less effective.  Therefore is going to try the Imodium route.  He has upcoming colonoscopy because of his diarrhea.  We advised the to stop the nintedanib 2 days before and 1-2 days after the procedure.  Similarly for dental procedure he will stop the nintedanib 2 days before and resume 1 or 2 days after the procedure.   He is interested in research protocol.  We discussed several studies.  He is not interested in the Phase 2 [place] study.  We discussed promedior IV infusion study.  He wants to think about this.  We will send in the consent form.  This upcoming study with an inhaler that is already preapproved in the market for pulmonary hypertension.  Is called treprostinil.  If we get the study then he might be interested in this.     SYMPTOM SCALE - ILD 11/29/2018   07/15/2019  01/14/2020   O2 use Room air    Shortness of Breath 0 -> 5 scale with 5 being worst (score 6 If unable to do)    At rest 0 0 0  Simple tasks - showers, clothes change, eating, shaving 0 0 0  Household (dishes, doing bed, laundry) 0 0   Shopping 0 0 0  Walking keeping up with  others of same age 21 0 00  Walking up Stairs _0 Total (40 - 48) Dyspnea Score _1 How bad is your cough? 0 0 1  How bad is your fatigue 0 1 1  nausea  1 1  vomit  0 0  diarhea  1 2  anxity  1 1  depresion  1 1    CBC Latest Ref Rng & Units 02/04/2016 02/03/2016 01/31/2016  WBC 4.0 - 10.5 K/uL 12.9(H) 17.6(H) 8.0  Hemoglobin 13.0 - 17.0 g/dL 14.7 14.3 16.7  Hematocrit 39 - 52 % 44.0 43.0 49.1  Platelets 150 - 400 K/uL 235 250 252    Wt Readings from Last 3 Encounters:  01/14/20 138 lb 12.8 oz (63 kg)  07/15/19 142 lb 12.8 oz (64.8 kg)  11/29/18 140 lb 6.4 oz (63.7 kg)       Simple office walk - 250 feet x 3 laps 02/26/2018  04/29/2018  11/29/2018  07/15/2019   O2 used Room air Room air Room air ra  Number laps completed _2 Comments about pace Good brisk     Resting Pulse Ox/HR 100% and 74/min 100% and 86/min     100% and 76/min 100%and 79/min  Final Pulse Ox/HR 96% and 96/min 97% and 93/min 96% and 99/min 96% and 111/min  Desaturated </= 88% no no no no  Desaturated <= 3% points yes Yes, 3 Yes, 4 Yes, 4 points  Got Tachycardic >/= 90/min yes yes yes yes  Symptoms at end of test No dyspnea No dyspnea  asympt  Miscellaneous comments x x      PFT Results Latest Ref Rng & Units 01/13/2020 10/14/2019 07/04/2018 05/21/2018 04/29/2018 10/19/2017 08/14/2017  FVC-Pre L 3.95 4.07 4.12 - 4.16 - 4.11  FVC-Predicted Pre % 99 102 102 - 98 - 97  FVC-Post L - - - - - - -  FVC-Predicted Post % - - - - - - -  Pre FEV1/FVC % % 90 88 88 - 85 - 88  Post FEV1/FCV % % - - - - - - -  FEV1-Pre L 3.55 3.57 3.61 - 3.55 - 3.60  FEV1-Predicted Pre % 119 119 119 - 112 - 114  FEV1-Post L - - - - - - -   DLCO uncorrected ml/min/mmHg 13.56 12.16 14.04 15.39 14.84 13.93 14.42  DLCO UNC% % 57 51 58 64 52 49 50  DLCO corrected ml/min/mmHg 13.56 12.16 13.34 - - - -  DLCO COR %Predicted % 57 51 55 - - - -  DLVA Predicted % 65 61 76 83 75 67 74  TLC L - - - - - - -  TLC % Predicted % - - - - - - -  RV % Predicted % - - - - - - -     Results for TAUREAN, JU (MRN [id number]) as of 11/29/2018 09:56  Ref. Range 08/02/2015 10:41 10/25/2016 08:44 02/26/2017 09:06 05/22/2017 08:59 08/14/2017 12:41 10/19/2017 12:23 04/29/2018 10:47 05/21/2018 10:11 07/04/2018 08:59  FVC-Pre Latest Units: L 4.00 4.16 4.04 3.91 4.11  4.16  4.12  FVC-%Pred-Pre Latest Units: % 93 98 95 92 97  98  102  Results for TIVIS, WHERRY (MRN [id number]) as of 11/29/2018 09:56  Ref. Range 08/02/2015 10:41 10/25/2016 08:44 02/26/2017 09:06 05/22/2017 08:59 08/14/2017 12:41 10/19/2017 12:23 04/29/2018 10:47 05/21/2018 10:11 07/04/2018 08:59  DLCO unc Latest Units: ml/min/mmHg 17.33 17.25 14.23 15.14 14.42 13.93 14.84 15.39 14.04  DLCO unc % pred Latest Units: % 61 60 50 53 50 49 52 64 58       has a past medical history of Dyspnea, Family history of colon cancer, Family history of prostate cancer, Family history of pulmonary fibrosis, Hyperlipemia, and Pneumonia.   reports that he has never smoked. He has never used smokeless tobacco.  Past Surgical History:  Procedure Laterality Date  . COLONOSCOPY WITH PROPOFOL N/A 09/21/2014   Procedure: COLONOSCOPY WITH PROPOFOL;  Surgeon: Garlan Fair, MD;  Location: [facility];  Service: Endoscopy;  Laterality: N/A;  . FOREARM SURGERY     excision of birth mark"child"  . HERNIA REPAIR     RIH-open  . KNEE ARTHROTOMY Left    bursa excision  . LUNG BIOPSY Right 02/02/2016   Procedure: LUNG BIOPSY;  Surgeon: [name], MD;  Location: [facility];  Service: Thoracic;  Laterality: Right;  . NASAL SEPTUM SURGERY    . VIDEO ASSISTED THORACOSCOPY Right 02/02/2016   Procedure: VIDEO ASSISTED  THORACOSCOPY;  Surgeon: [name], MD;  Location: [facility];  Service: Thoracic;  Laterality: Right;  . WRIST GANGLION EXCISION Left     Allergies  Allergen Reactions  . Penicillins Other (See Comments)    UNSPECIFIED REACTION OF CHILDHOOD Has patient had a PCN reaction causing immediate rash, facial/tongue/throat swelling, SOB or lightheadedness with hypotension:unsure Has patient had a PCN reaction causing severe rash involving mucus membranes or skin necrosis:unsure Has patient had a PCN reaction that required hospitalization:No Has patient had a PCN reaction occurring within the last 10 years:No If all of the above answers are "NO", then may proceed with Cephalosporin use.     Immunization History  Administered Date(s) Administered  . Influenza,inj,Quad PF,6+ Mos 12/09/2016, 11/26/2017, 11/29/2018, 12/31/2019  . Influenza-Unspecified 11/02/2015, 12/09/2016  . Moderna SARS-COVID-2 Vaccination 06/05/2019, 07/08/2019  . Pneumococcal Conjugate-13 02/26/2018  . Pneumococcal Polysaccharide-23 02/05/2016  . Zoster 11/02/2015  . Zoster Recombinat (Shingrix) 12/09/2016, 02/10/2017    Family History  Problem Relation Age of Onset  . Lung cancer Mother   . Colon cancer Mother 72  . Pulmonary fibrosis Father        dx in his 63s  . Prostate cancer Brother 64  . COPD Paternal Grandfather   . Other Paternal Uncle        possible lung dx; died around age 65  . Other Cousin        possible lung dx, died in her 39s; paternal first cousin  . Healthy Son      Current Outpatient Medications:  .  albuterol (PROVENTIL HFA;VENTOLIN HFA) 108 (90 Base) MCG/ACT inhaler, Inhale 2 puffs into the lungs every 6 (six) hours as needed for wheezing or shortness of breath., Disp: 1 Inhaler, Rfl: 5 .  aspirin EC 81 MG tablet, Take 81 mg by mouth daily., Disp: , Rfl:  .  Chlorpheniramine-Pseudoeph 4-60 MG TABS, Take 1 tablet by mouth 2 (two) times daily as needed (for allergies.)., Disp: , Rfl:    .  Multiple Vitamin (MULTIVITAMIN WITH MINERALS) TABS tablet, Take 1 tablet by mouth daily., Disp: , Rfl:  .  OFEV 150 MG CAPS, TAKE 1 CAPSULE BY MOUTH TWICE DAILY 12 HOURS APART WITH FOOD, Disp: 60 capsule, Rfl: 11 .  simvastatin (ZOCOR) 10 MG tablet, Take 10 mg by mouth daily at 6 PM. , Disp: , Rfl: 0      Objective:   Vitals:   01/14/20 1326  BP: 114/70  Pulse: 77  SpO2: 98%  Weight: 138 lb 12.8 oz (63 kg)  Height: 5' 7" (1.702 m)    Estimated body mass index is 21.74 kg/m as calculated from the following:   Height as of this encounter: 5' 7" (1.702 m).   Weight as of this encounter: 138 lb 12.8 oz (63 kg).  _0 @  Filed Weights   01/14/20 1326  Weight: 138 lb 12.8 oz (63 kg)     Physical Exam General: No distress. Looks well Neuro: Alert and Oriented x 3. GCS 15. Speech normal Psych: Pleasant Resp:  Barrel Chest - no.  Wheeze - no, Crackles - at base yes, No overt respiratory distress CVS: Normal heart sounds. Murmurs - no Ext: Stigmata of Connective Tissue Disease - no HEENT: Normal upper airway. PEERL +. No post nasal drip        Assessment:       ICD-10-CM   1. IPF (idiopathic pulmonary fibrosis) ([place])  J84.112   2. Encounter for therapeutic drug monitoring  Z51.81   3. Familial idiopathic pulmonary fibrosis ([place])  X41.287        Plan:     Patient Instructions      ICD-10-CM   1. IPF (idiopathic pulmonary fibrosis) ([place])  J84.112   2. Encounter for therapeutic drug monitoring  Z51.81   3. Familial idiopathic pulmonary fibrosis (HCC)  J84.112       Stable IPF on Nintedanib but slightly progressive slowly over time since 2017 and some since 2020  Mild to moderate diarrhea from ofev but weight stable  Thanks for participating in the IPF-pro registry study  Plan  - continue Nintednaib [facility]) as before  - do not recommend that you reduce the nintedanib but on the other hand I recommend that you manage the diarrhea  -Regarding  diarrhea: Cut down all sugary foods and artificial sugar substitute and soft drinks to the extent possible  -Regarding diarrhea: Also try Imodium on a once a week or twice a week scheduled basis in  - check LFT today   - we discussed trials as care option-I will have Katherine Mantle email you consent form for STARSCAPE study.  We can consider TETON inhaler study in future. Understand you are NOT interested in pliant study  Followup  - 5 months do spirometry and dlco  - return to clinic (30 min slot) for followup in 5 months - return sooner if needed     SIGNATURE    Dr. Brand Males, M.D., F.C.C.P,  Pulmonary and Critical Care Medicine Staff Physician, [place] Director - Interstitial Lung Disease  Program  [facility] [place], [place], [address]  Pager: [phone], If no answer or between  15:00h - 7:00h: call [phone] Telephone: [phone]  2:08 PM 01/14/2020

## 2020-05-17 ENCOUNTER — Telehealth: Payer: Self-pay | Admitting: Internal Medicine

## 2020-05-17 DIAGNOSIS — J84112 Idiopathic pulmonary fibrosis: Secondary | ICD-10-CM

## 2020-05-17 NOTE — Telephone Encounter (Signed)
Will place in Dr. Donald Prose box for MD signature.

## 2020-05-19 NOTE — Telephone Encounter (Signed)
Received signed [facility] Provider form. Faxed to [employer].  Phone# [phone]

## 2020-05-31 ENCOUNTER — Other Ambulatory Visit (HOSPITAL_COMMUNITY)
Admission: RE | Admit: 2020-05-31 | Discharge: 2020-05-31 | Disposition: A | Discharge status: Home / Self Care | Payer: Medicare Other | Source: Ambulatory Visit | Attending: Internal Medicine | Admitting: Internal Medicine

## 2020-05-31 DIAGNOSIS — Z01812 Encounter for preprocedural laboratory examination: Secondary | ICD-10-CM | POA: Insufficient documentation

## 2020-05-31 DIAGNOSIS — Z20822 Contact with and (suspected) exposure to covid-19: Secondary | ICD-10-CM | POA: Diagnosis not present

## 2020-06-01 LAB — SARS CORONAVIRUS 2 (TAT 6-24 HRS): SARS Coronavirus 2: NEGATIVE

## 2020-06-02 NOTE — Telephone Encounter (Signed)
Called [employer] to check on patient's application. Rep advised prescription page and patient's income was not received.  Refaxed patient's prescription page. We did not receive patient portion and income. Called patient and left message to see if he submitted his self and advised that information needs to be refaxed to [phone].  [employer] [facility] phone# [phone]

## 2020-06-03 ENCOUNTER — Other Ambulatory Visit: Payer: Self-pay | Admitting: Adult Health

## 2020-06-03 ENCOUNTER — Ambulatory Visit (INDEPENDENT_AMBULATORY_CARE_PROVIDER_SITE_OTHER): Payer: Medicare Other | Admitting: Internal Medicine

## 2020-06-03 ENCOUNTER — Ambulatory Visit: Payer: BC Managed Care – PPO | Admitting: Internal Medicine

## 2020-06-03 ENCOUNTER — Other Ambulatory Visit: Payer: Self-pay

## 2020-06-03 ENCOUNTER — Encounter: Payer: BC Managed Care – PPO | Admitting: *Deleted

## 2020-06-03 ENCOUNTER — Other Ambulatory Visit (INDEPENDENT_AMBULATORY_CARE_PROVIDER_SITE_OTHER): Payer: Medicare Other

## 2020-06-03 DIAGNOSIS — J84112 Idiopathic pulmonary fibrosis: Secondary | ICD-10-CM

## 2020-06-03 DIAGNOSIS — Z5181 Encounter for therapeutic drug level monitoring: Secondary | ICD-10-CM | POA: Diagnosis not present

## 2020-06-03 LAB — HEPATIC FUNCTION PANEL
ALT: 12 U/L (ref 0–53)
AST: 23 U/L (ref 0–37)
Albumin: 3.9 g/dL (ref 3.5–5.2)
Alkaline Phosphatase: 53 U/L (ref 39–117)
Bilirubin, Direct: 0.1 mg/dL (ref 0.0–0.3)
Total Bilirubin: 0.6 mg/dL (ref 0.2–1.2)
Total Protein: 6.4 g/dL (ref 6.0–8.3)

## 2020-06-03 LAB — PULMONARY FUNCTION TEST
DL/VA % pred: 66 %
DL/VA: 2.78 ml/min/mmHg/L
DLCO cor % pred: 63 %
DLCO cor: 15.03 ml/min/mmHg
DLCO unc % pred: 63 %
DLCO unc: 15.03 ml/min/mmHg
FEF 25-75 Pre: 3.27 L/sec
FEF2575-%Pred-Pre: 138 %
FEV1-%Pred-Pre: 109 %
FEV1-Pre: 3.22 L
FEV1FVC-%Pred-Pre: 109 %
FEV6-%Pred-Pre: 105 %
FEV6-Pre: 3.95 L
FEV6FVC-%Pred-Pre: 105 %
FVC-%Pred-Pre: 99 %
FVC-Pre: 3.95 L
Pre FEV1/FVC ratio: 82 %
Pre FEV6/FVC Ratio: 100 %

## 2020-06-03 NOTE — Progress Notes (Signed)
Spirometry/DLCO performed today.

## 2020-06-03 NOTE — Progress Notes (Signed)
Lab order entry

## 2020-06-03 NOTE — Research (Signed)
IPF-PRO Registry  Purpose: To collect data and biological samples that will support future research studies.  The goal of the registry is to research the current approaches to diagnosis, treatment, and progression of IPF. In addition, the registry will analyze participant characteristics, assess quality of life, describe participants interactions with the health care system, determine IPF treatment practices across multiple institutions, and utilize biological samples to identify disease biomarkers.   Clinical Research Coordinator / Research RN note : This visit for [name] with DOB: March 06, 1956 on 06/03/2020 for the above protocol is Visit/Encounter #48 Month and is for purpose of research.   The consent for this encounter is under Protocol Version Amendment 4 (Version Date: [id number]) and is currently IRB approved.  Subject expressed continued interest and consent in continuing as a study subject. Subject confirmed that there was no change in contact information (e.g. address, telephone, email). Subject thanked for participation in research and contribution to science.   During this visit on 06/03/20, the subject completed the blood work and questionnaires per the above referenced protocol. Please refer to the subject's paper source binder for further details.  Signed by  Lazaro Arms  Clinical Research Coordinator / Nurse Horatio Pel, [place] 11:23 AM 06/03/2020

## 2020-06-03 NOTE — Patient Instructions (Signed)
Spirometry and DLCO performed today.

## 2020-06-04 ENCOUNTER — Ambulatory Visit (INDEPENDENT_AMBULATORY_CARE_PROVIDER_SITE_OTHER): Payer: Medicare Other | Admitting: Adult Health

## 2020-06-04 ENCOUNTER — Encounter: Payer: Self-pay | Admitting: Adult Health

## 2020-06-04 VITALS — BP 116/72 | HR 73 | Temp 97.3°F | Ht 67.0 in | Wt 137.0 lb

## 2020-06-04 DIAGNOSIS — R197 Diarrhea, unspecified: Secondary | ICD-10-CM | POA: Diagnosis not present

## 2020-06-04 DIAGNOSIS — R5381 Other malaise: Secondary | ICD-10-CM | POA: Diagnosis not present

## 2020-06-04 DIAGNOSIS — J84112 Idiopathic pulmonary fibrosis: Secondary | ICD-10-CM | POA: Diagnosis not present

## 2020-06-04 DIAGNOSIS — Z5181 Encounter for therapeutic drug level monitoring: Secondary | ICD-10-CM | POA: Diagnosis not present

## 2020-06-04 NOTE — Assessment & Plan Note (Signed)
Liver function testing was reviewed and is normal.

## 2020-06-04 NOTE — Assessment & Plan Note (Signed)
IPF-clinically patient appears to be stable.  He remains active with no change in level of dyspnea or activity tolerance.  Pulmonary function testing today shows stability.  DLCO is slightly improved. Patient is tolerating Ofev.  Does have chronic diarrhea but appears to be manageable.  Patient is instructed on dietary helpful tips and to use Imodium as needed.  Would like for him to keep a close eye on weight.  And instructed on high-protein diet. Last high-resolution CT chest was in 2020.  We will repeat spirometry and DLCO on return.  Consider a repeat high-res CT chest if indicated on return.  We will also check 6-minute walk test on return  Plan  Patient Instructions  Continue on OFEV 134m Twice daily   Immodium As needed   High protein diet .  Keep weight log .  Advance activity and exercise as able.  Follow up with Dr. RChase Callerin ILD clinic in 5 months with Spirometry with DLCO and 6 min walk test .

## 2020-06-04 NOTE — Patient Instructions (Addendum)
Continue on OFEV 179m Twice daily   Immodium As needed   High protein diet .  Keep weight log .  Advance activity and exercise as able.  Follow up with Dr. RChase Callerin ILD clinic in 5 months with Spirometry with DLCO and 6 min walk test .

## 2020-06-04 NOTE — Telephone Encounter (Signed)
Patient brought income documents to [place] today for his pending Ofev PAP application. Faxed to [employer]. Will update once we receive a response.  Phone# [phone]

## 2020-06-04 NOTE — Assessment & Plan Note (Signed)
Chronic diarrhea appears to be stable.  Most likely secondary side effect of Ofev.  Have encouraged on helpful food and dietary hints.  Imodium as needed.  Follow-up for colonoscopy as discussed.  Keep weight log.

## 2020-06-04 NOTE — Progress Notes (Signed)
_0  ID: Shaun [name], male    DOB: June 09, 1955, 65 y.o.   MRN: [id number]  Chief Complaint  Patient presents with  . Follow-up    Referring provider: Mayra Neer, MD  HPI: 65 year old male never smoker followed for IPF, initially seen for pulmonary consult in 2017 Family history of IPF with father diagnosed at age 12  TEST/EVENTS :  PFTs January 13, 2020 FEV1 119%, ratio 90, FVC 99%, DLCO 57%  April 2017 and November 2017 extensive autoimmune and vascular this panel is negative  - High resolution CT scan of the chest August 2017: UIP pattern that is classic - personally visualized. There is also 5 mm right lower lobe lung nodule and a 5.9 cm left-sided lower bulla [stable compared to April 2017] - Pulmonary function test 08/02/2015: Isolated reduction in diffusion capacity 17.33/61%. Otherwise FVC 4 L/93% and normal. Total lung capacity 5.9 L/91% and normal  - Walking desaturation test 185 feet 3 laps on room air in the office 02/22/2016: Did not desaturate but gets symptomatic  Histopathology at [place] 02/02/2016 read at [facility] started 2018  06/04/2020 Follow up : IPF  Patient returns for a 46-monthfollow-up.  Patient has underlying IPF.  Since last visit patient says he is doing about the same.  He has had no change in his level of shortness of breath or activity tolerance.  Patient does remain active exercises on a daily basis.  He walks about 45 minutes a day.  He does get winded with heavy activity or fast pace.  Says he walks a slow steady pace.  Patient's weight is down about 3 to 5 pounds since last visit.  We discussed eating a higher protein diet.  Patient says he does eat and has a good appetite but does monitor his food intake due to chronic diarrhea with Ofev.  Patient does use Imodium about every 4 days.  Has had no change in his level of diarrhea.  Patient is planning on a colonoscopy in the next few months.  He did not have it done as  previously felt since last visit due to insurance issues. PFTs done yesterday June 03, 2020 showed FEV1 at 109%, ratio 82, FVC 99%, DLCO 63%. This shows stable lung function with improved diffusing capacity compared to October 2021 Patient is fully vaccinated for COVID-19 including booster.  He denies any significant cough.  Patient says he is very active.  We did discuss pulmonary rehab.  He feels that he is active and does not needed this at this time.  He is not on oxygen.  O2 saturations today were 99% on room air.  Patient says he checks his oxygen at home and typically runs anywhere between 95 to 96% with his pulse oximeter.  Patient is married.  Is fully retired.  He has a 282year-old grandson.  And also has another grandchild on the way.   Allergies  Allergen Reactions  . Penicillins Other (See Comments)    UNSPECIFIED REACTION OF CHILDHOOD Has patient had a PCN reaction causing immediate rash, facial/tongue/throat swelling, SOB or lightheadedness with hypotension:unsure Has patient had a PCN reaction causing severe rash involving mucus membranes or skin necrosis:unsure Has patient had a PCN reaction that required hospitalization:No Has patient had a PCN reaction occurring within the last 10 years:No If all of the above answers are "NO", then may proceed with Cephalosporin use.     Immunization History  Administered Date(s) Administered  . Influenza,inj,Quad PF,6+ Mos 12/09/2016, 11/26/2017, 11/29/2018,  12/31/2019  . Influenza-Unspecified 11/02/2015, 12/09/2016  . Moderna SARS-COV2 Booster Vaccination 01/31/2020  . Moderna Sars-Covid-2 Vaccination 06/05/2019, 07/08/2019  . Pneumococcal Conjugate-13 02/26/2018  . Pneumococcal Polysaccharide-23 02/05/2016  . Zoster 11/02/2015  . Zoster Recombinat (Shingrix) 12/09/2016, 02/10/2017    Past Medical History:  Diagnosis Date  . Dyspnea   . Family history of colon cancer   . Family history of prostate cancer   . Family history of  pulmonary fibrosis   . Hyperlipemia   . Pneumonia     Tobacco History: Social History   Tobacco Use  Smoking Status Never Smoker  Smokeless Tobacco Never Used   Counseling given: Not Answered   Outpatient Medications Prior to Visit  Medication Sig Dispense Refill  . albuterol (PROVENTIL HFA;VENTOLIN HFA) 108 (90 Base) MCG/ACT inhaler Inhale 2 puffs into the lungs every 6 (six) hours as needed for wheezing or shortness of breath. 1 Inhaler 5  . aspirin EC 81 MG tablet Take 81 mg by mouth daily.    . Chlorpheniramine-Pseudoeph 4-60 MG TABS Take 1 tablet by mouth 2 (two) times daily as needed (for allergies.).    Marland Kitchen Multiple Vitamin (MULTIVITAMIN WITH MINERALS) TABS tablet Take 1 tablet by mouth daily.    Marland Kitchen OFEV 150 MG CAPS TAKE 1 CAPSULE BY MOUTH TWICE DAILY 12 HOURS APART WITH FOOD 60 capsule 11  . simvastatin (ZOCOR) 10 MG tablet Take 10 mg by mouth daily at 6 PM.   0   No facility-administered medications prior to visit.     Review of Systems:   Constitutional:   No  weight loss, night sweats,  Fevers, chills,  +fatigue, or  lassitude.  HEENT:   No headaches,  Difficulty swallowing,  Tooth/dental problems, or  Sore throat,                No sneezing, itching, ear ache, nasal congestion, post nasal drip,   CV:  No chest pain,  Orthopnea, PND, swelling in lower extremities, anasarca, dizziness, palpitations, syncope.   GI  No heartburn, indigestion, abdominal pain, nausea, vomiting,   change in bowel habits, loss of appetite, bloody stools.  Positive diarrhea  Resp:   No excess mucus, no productive cough,  No non-productive cough,  No coughing up of blood.  No change in color of mucus.  No wheezing.  No chest wall deformity  Skin: no rash or lesions.  GU: no dysuria, change in color of urine, no urgency or frequency.  No flank pain, no hematuria   MS:  No joint pain or swelling.  No decreased range of motion.  No back pain.    Physical Exam  BP 116/72 (BP Location:  Left Arm, Cuff Size: Normal)   Pulse 73   Temp (!) 97.3 F (36.3 C)   Ht _0  (1.702 m)   Wt 137 lb (62.1 kg)   SpO2 99%   BMI 21.46 kg/m   GEN: A/Ox3; pleasant , NAD, well nourished, thin male   HEENT:  [place]/AT,   NOSE-clear, THROAT-clear, no lesions, no postnasal drip or exudate noted.   NECK:  Supple w/ fair ROM; no JVD; normal carotid impulses w/o bruits; no thyromegaly or nodules palpated; no lymphadenopathy.    RESP faint bibasilar crackles  no accessory muscle use, no dullness to percussion  CARD:  RRR, no m/r/g, no peripheral edema, pulses intact, no cyanosis or clubbing.  GI:   Soft & nt; nml bowel sounds; no organomegaly or masses detected.   Musco: Warm bil,  no deformities or joint swelling noted.   Neuro: alert, no focal deficits noted.    Skin: Warm, no lesions or rashes    Lab Results: Hepatic Function Latest Ref Rng & Units 06/03/2020 01/14/2020 07/15/2019  Total Protein 6.0 - 8.3 g/dL 6.4 6.4 6.2  Albumin 3.5 - 5.2 g/dL 3.9 4.0 4.0  AST 0 - 37 U/L _0 ALT 0 - 53 U/L _1 Alk Phosphatase 39 - 117 U/L 53 54 57  Total Bilirubin 0.2 - 1.2 mg/dL 0.6 0.5 0.5  Bilirubin, Direct 0.0 - 0.3 mg/dL 0.1 0.1 0.1     BNP No results found for: BNP  ProBNP No results found for: PROBNP  Imaging: No results found.    PFT Results Latest Ref Rng & Units 06/03/2020 01/13/2020 10/14/2019 07/04/2018 05/21/2018 04/29/2018 10/19/2017  FVC-Pre L 3.95 3.95 4.07 4.12 - 4.16 -  FVC-Predicted Pre % 99 99 102 102 - 98 -  FVC-Post L - - - - - - -  FVC-Predicted Post % - - - - - - -  Pre FEV1/FVC % % 82 90 88 88 - 85 -  Post FEV1/FCV % % - - - - - - -  FEV1-Pre L 3.22 3.55 3.57 3.61 - 3.55 -  FEV1-Predicted Pre % 109 119 119 119 - 112 -  FEV1-Post L - - - - - - -  DLCO uncorrected ml/min/mmHg 15.03 13.56 12.16 14.04 15.39 14.84 13.93  DLCO UNC% % 63 57 51 58 64 52 49  DLCO corrected ml/min/mmHg 15.03 13.56 12.16 13.34 - - -  DLCO COR %Predicted % 63 57 51 55 - - -   DLVA Predicted % 66 65 61 76 83 75 67  TLC L - - - - - - -  TLC % Predicted % - - - - - - -  RV % Predicted % - - - - - - -    No results found for: NITRICOXIDE      Assessment & Plan:   IPF (idiopathic pulmonary fibrosis) (HCC) IPF-clinically patient appears to be stable.  He remains active with no change in level of dyspnea or activity tolerance.  Pulmonary function testing today shows stability.  DLCO is slightly improved. Patient is tolerating Ofev.  Does have chronic diarrhea but appears to be manageable.  Patient is instructed on dietary helpful tips and to use Imodium as needed.  Would like for him to keep a close eye on weight.  And instructed on high-protein diet. Last high-resolution CT chest was in 2020.  We will repeat spirometry and DLCO on return.  Consider a repeat high-res CT chest if indicated on return.  We will also check 6-minute walk test on return  Plan  Patient Instructions  Continue on OFEV 160m Twice daily   Immodium As needed   High protein diet .  Keep weight log .  Advance activity and exercise as able.  Follow up with Dr. RChase Callerin ILD clinic in 5 months with Spirometry with DLCO and 6 min walk test .        Diarrhea Chronic diarrhea appears to be stable.  Most likely secondary side effect of Ofev.  Have encouraged on helpful food and dietary hints.  Imodium as needed.  Follow-up for colonoscopy as discussed.  Keep weight log.  Physical deconditioning Mild physical deconditioning.  Patient remains very active.  Does have shortness of breath and fatigue with activities.  Have encouraged him to maintain active  lifestyle.  Discussed pulmonary rehab referral however he declines at this time.  Continue to monitor closely.  6-minute walk test on return  Therapeutic drug monitoring Liver function testing was reviewed and is normal.     Rexene Edison, NP 06/04/2020

## 2020-06-04 NOTE — Assessment & Plan Note (Signed)
Mild physical deconditioning.  Patient remains very active.  Does have shortness of breath and fatigue with activities.  Have encouraged him to maintain active lifestyle.  Discussed pulmonary rehab referral however he declines at this time.  Continue to monitor closely.  6-minute walk test on return

## 2020-06-14 NOTE — Telephone Encounter (Signed)
Called and spoke to patient.  Received fax from [facility] for [facility] patient assistance, patient's application has been DENIED due to income is over program guidelines.    Phone# [phone]  Income documentation submitted was 2021 tax return, patient states they since that tax return patient stopped working and is only receiving social security and his wife started a new job. He is not sure if this income is lower that 2021. Advised he can try submitting his Social security statement and 3 of his wife's pay stubs to see if we can get a redetermination.  Patient placed his last refill through [facility] for a 2 month supply while we work on redetermination or for [employer] to reopen.  Will follow up.

## 2020-06-16 NOTE — Telephone Encounter (Signed)
Patient has been approved for IPF copay grant through [employer]. Approved through 04/03/20 through 04/02/21.   Processing information: PCN: [id number] BIN: [id number] Member Number: [id number] Group Number: [id number]  Rx helpdesk Phone: [phone] Program Phone Number: [phone]   Submitted a Prior Authorization request to [facility] for [place] via Cover My Meds. Will update once we receive a response.    Key: [id number] - PA Case ID: [id number]

## 2020-06-16 NOTE — Telephone Encounter (Signed)
Left patient a message to call back for an update on patient assistance.

## 2020-06-17 ENCOUNTER — Telehealth: Payer: Self-pay | Admitting: Internal Medicine

## 2020-06-17 NOTE — Telephone Encounter (Signed)
Received notification from [facility] regarding a prior authorization for OFEV 144m. Authorization has been APPROVED from 06/16/20 until further notice.   Authorization # [id number] Please send prescription to [facility]. GFatima Sangerinformation is above.

## 2020-06-17 NOTE — Telephone Encounter (Signed)
Spoke to patient and advised of Shaun [name] approval. We are still waiting on insurance approval. We will call him to discuss where rx will be sent. No further questions.

## 2020-06-17 NOTE — Telephone Encounter (Signed)
Will update in previous encounter

## 2020-06-18 MED ORDER — OFEV 150 MG PO CAPS: 150.0000 mg | ORAL_CAPSULE | Freq: Two times a day (BID) | ORAL | 3 refills | Status: DC

## 2020-06-18 NOTE — Addendum Note (Signed)
Addended by: Cassandria Anger on: 06/18/2020 02:52 PM   Modules accepted: Orders

## 2020-06-18 NOTE — Telephone Encounter (Signed)
[facility] with grant information in pharmacy notes.  Dose: 159m twice daily  LFTS wnl on 06/09/20  DKnox Saliva PharmD, MPH Clinical Pharmacist (Rheumatology and Pulmonology)

## 2020-06-21 NOTE — Telephone Encounter (Signed)
[facility] and provided patient's insurance and grant processing information. They will process and reach out to patient in the next 24-48 hours.  Phone# [phone]

## 2020-06-23 ENCOUNTER — Telehealth: Payer: Self-pay | Admitting: Internal Medicine

## 2020-06-24 NOTE — Telephone Encounter (Signed)
[facility] to [facility] and [facility]. Patient received his last shipment from [facility] for [facility] and has 1 month supply of medication. [facility] will contact [facility] at the end of April or early May to set up shipment.

## 2020-06-24 NOTE — Telephone Encounter (Signed)
Returned call, will update in previous encounter.

## 2020-06-29 ENCOUNTER — Ambulatory Visit: Payer: BC Managed Care – PPO | Admitting: Internal Medicine

## 2020-08-11 ENCOUNTER — Other Ambulatory Visit: Payer: Self-pay | Admitting: Family Medicine

## 2020-08-11 DIAGNOSIS — Z87891 Personal history of nicotine dependence: Secondary | ICD-10-CM

## 2020-09-28 ENCOUNTER — Telehealth: Payer: Self-pay | Admitting: Internal Medicine

## 2020-09-28 NOTE — Telephone Encounter (Signed)
Called and spoke with pt who stated he is scheduled to have a tooth pulled in July and wanted to know if he should stop taking the OFEV prior to the procedure.  MR, please advise.

## 2020-09-29 NOTE — Telephone Encounter (Signed)
Ok to stop esbriet 1day beforer and restart 1 day later if no excess bleeding Also, let him know that TETON inhaler study is nearly ready to start and Reba will be reaching out to him

## 2020-09-30 NOTE — Telephone Encounter (Signed)
Called and spoke to pt. Informed him of the recs per MR. Pt verbalized understanding and denied any further questions or concerns at this time.

## 2020-11-18 ENCOUNTER — Ambulatory Visit (INDEPENDENT_AMBULATORY_CARE_PROVIDER_SITE_OTHER): Payer: Medicare Other

## 2020-11-18 ENCOUNTER — Other Ambulatory Visit: Payer: Self-pay

## 2020-11-18 DIAGNOSIS — J84112 Idiopathic pulmonary fibrosis: Secondary | ICD-10-CM

## 2020-11-18 NOTE — Progress Notes (Signed)
Six Minute Walk - 11/18/20 1449       Six Minute Walk   Medications taken before test (dose and time) Ofev 175m, aspirin 856m Chlor-tab 28m53mnd a multivitamin were all taken this morning around 650am.    Supplemental oxygen during test? No    Lap distance in meters  34 meters    Laps Completed 16    Partial lap (in meters) 0 meters    Baseline BP (sitting) 124/76    Baseline Heartrate 74    Baseline Dyspnea (Borg Scale) 1    Baseline Fatigue (Borg Scale) 1    Baseline SPO2 96 %      End of Test Values    BP (sitting) 128/84    Heartrate 82    Dyspnea (Borg Scale) 2    Fatigue (Borg Scale) 1    SPO2 95 %      2 Minutes Post Walk Values   BP (sitting) 128/80    Heartrate 72    SPO2 96 %    Stopped or paused before six minutes? No      Interpretation   Distance completed 544 meters    Tech Comments: Patient was able to complete 6mw41m a steady, brisk pace. He denied any leg pain or swelling, chest pain or SOB during or after walk. No O2 was needed during or after walk.

## 2020-12-22 ENCOUNTER — Other Ambulatory Visit: Payer: Self-pay

## 2020-12-22 ENCOUNTER — Encounter (INDEPENDENT_AMBULATORY_CARE_PROVIDER_SITE_OTHER): Payer: Medicare Other | Admitting: *Deleted

## 2020-12-22 ENCOUNTER — Other Ambulatory Visit: Payer: Self-pay | Admitting: *Deleted

## 2020-12-22 ENCOUNTER — Encounter: Payer: Self-pay | Admitting: Internal Medicine

## 2020-12-22 ENCOUNTER — Ambulatory Visit (INDEPENDENT_AMBULATORY_CARE_PROVIDER_SITE_OTHER): Payer: Medicare Other | Admitting: Internal Medicine

## 2020-12-22 VITALS — BP 116/60 | HR 75 | Temp 97.7°F | Ht 66.0 in | Wt 138.0 lb

## 2020-12-22 DIAGNOSIS — Z5181 Encounter for therapeutic drug level monitoring: Secondary | ICD-10-CM

## 2020-12-22 DIAGNOSIS — J84112 Idiopathic pulmonary fibrosis: Secondary | ICD-10-CM

## 2020-12-22 LAB — HEPATIC FUNCTION PANEL
ALT: 14 U/L (ref 0–53)
AST: 24 U/L (ref 0–37)
Albumin: 4.1 g/dL (ref 3.5–5.2)
Alkaline Phosphatase: 59 U/L (ref 39–117)
Bilirubin, Direct: 0.1 mg/dL (ref 0.0–0.3)
Total Bilirubin: 0.5 mg/dL (ref 0.2–1.2)
Total Protein: 6.8 g/dL (ref 6.0–8.3)

## 2020-12-22 LAB — PULMONARY FUNCTION TEST
DL/VA % pred: 74 %
DL/VA: 3.14 ml/min/mmHg/L
DLCO cor % pred: 62 %
DLCO cor: 14.65 ml/min/mmHg
DLCO unc % pred: 62 %
DLCO unc: 14.65 ml/min/mmHg
FEF 25-75 Pre: 3.42 L/sec
FEF2575-%Pred-Pre: 144 %
FEV1-%Pred-Pre: 112 %
FEV1-Pre: 3.3 L
FEV1FVC-%Pred-Pre: 109 %
FEV6-%Pred-Pre: 107 %
FEV6-Pre: 4.03 L
FEV6FVC-%Pred-Pre: 105 %
FVC-%Pred-Pre: 101 %
FVC-Pre: 4.03 L
Pre FEV1/FVC ratio: 82 %
Pre FEV6/FVC Ratio: 100 %

## 2020-12-22 NOTE — Progress Notes (Signed)
Spirometry/DLCO performed today.

## 2020-12-22 NOTE — Patient Instructions (Addendum)
ICD-10-CM   1. IPF (idiopathic pulmonary fibrosis) ([place])  J84.112   2. Encounter for therapeutic drug monitoring  Z51.81   3. Familial idiopathic pulmonary fibrosis (HCC)  J84.112       Stable IPF on Nintedanib but slightly progressive slowly over time since 2017 -> latter part of 2018 and then stable through Sept 2022 on PFT  Mild  from ofev but weight stable  LFT normal 12/22/2020   Thanks for participating in the IPF-pro registry study  Respect reluctance for TETON study due to inhaler schedule  Discussed starscape study  Plan  - continue Nintednaib (Ofev) as before - mRNA booster bivalen mRNA vaccine this week and then high dose flu shot 1-2 weeks later  - take consent form for Starsscape [employer] study  Followup  - 6 months do spirometry and dlco  - return to clinic (30 min slot) for followup in 6 months - return sooner if needed

## 2020-12-22 NOTE — Patient Instructions (Signed)
Spirometry/DLCO performed today.

## 2020-12-22 NOTE — Progress Notes (Signed)
OV 02/22/2016  Chief Complaint  Patient presents with   Pulmonary Consult    Pt referred by Dr.Byrum for ILD. Pt states he feels he is still recovering from the lung biopsy. Pt denies cough and CP/tightness.     2nd opinionand transfer of care for ILD/UIP - IPF from DR Byrum. Presents with his wife. They related to Dr. Stanford Breed cardiologist   65 year old male found to have crackles on physical exam and subsequently surgical lung biopsy 02/02/2016 showed UIP as read by pathologist in [place].  [employer] of chest physicians interstitial lung disease questionnaire  Symptoms: He does not cough and is not troubled by shortness of breath except with strenuous exercise. Onset is only "recently" Past medical history: Positive for chronic sinus drainage mild. Recently after surgical lung biopsy was being a few bone unintentional weight loss. Otherwise negative Personal exposure history: He smoked occasional cigarettes having grown up on her [place] farm starting at 36 including when he was age 82.  Family history: All the male members in his family who smoke developed COPD but also did not smoke to the pulmonary fibrosis especially his father and his paternal uncles. His dad was diagnosed with pulmonary fibrosis at age of 71 in 05-23-2001 and died from progressive hypoxemic respiratory failure after what sounds like an IPF flare in 2005/05/23.  Home exposure history: He is lived in the same house since 05-24-87. The home does not have a humidifier or sound or hot tub or Jacuzzi. He is not exposed to any birds. 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 Approximately 30 years of work ending 4 years ago used to cut through dry walls and was exposed to concrete dust and drywall dust and possibly asbestos. HE is to  cut through insulation safe. 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 Did not work in a minor quarry a Teacher, music on Agricultural engineer. Denies any exposure to birds of feathers  Autoimmune history: Is negative.  History of exposure to pulmonary toxic drugs: Negative   Lab work - April 2017 and November 2017 extensive autoimmune and vascular this panel is negative  - High resolution CT scan of the chest August 2017: UIP pattern that is classic - personally visualized. There is also 5 mm right lower lobe lung nodule and a 5.9 cm left-sided lower bulla [stable compared to April 2017]   Severity - Pulmonary function test 08/02/2015: Isolated reduction in diffusion capacity 17.33/61%. Otherwise FVC 4 L/93% and normal. Total lung capacity 5.9 L/91% and normal - Walking desaturation test 185 feet 3 laps on room air in the office 02/22/2016: Did not desaturate but gets symptomatic   Emotional state: He is extremely worried about the diagnosis of IPF. This is particularly so because he has a strong family history and his father died within 73 years usually was diagnosed much order at age 13. Patient is worried that he got diagnosed with the same problem and much younger age of 61 and feels that he only will have a few years to live. Multiple times during the visit he was teary-eyed   OV 07/21/2016  Chief Complaint  Patient presents with   Follow-up  Pt denies change in breathing since last OV. Pt denies cough and SOB and CP/tightness.      Follow-up idiopathic pulmonary fibrosis familial variety  He presents with his wife. Overall he is stable. 4 routine activities of daily living and mowing the yard he does not have any shortness of breath or cough. He did find the mountains recently been quite dyspneic. This is worse than June 2017 with for the diagnosis of IPF. Overall he is tolerating the Ofev just  fine except occasionally he has some GI discomfort.He is questions about research trials, exercise, pulmonary fibrosis foundation and family genetics.he is quite active and diffuse pulmonary rehabilitation is easy for him. He is willing to go to [facility] and work on aerobic strengthening and muscle conditioning  Notice that she's not had pulmonary function testing since May 2017    has a past medical history of Dyspnea; Hyperlipemia; and Pneumonia.   reports that he has never smoked. He has never used smokeless tobacco.  OV 10/25/2016  Chief Complaint  Patient presents with   Follow-up    Pt here after PFT. Pt states his breathing is unchanged. Pt c/o PND and dry cough. Pt denies f/c/s and CP/tightenss.    Follow-up mild idiopathic pulmonary fibrosis he's on Ofev since  NOv 2017  Here with his wife. Overall he is stable. His pulmonary function tests and CT scan of the chest shows continued stability for 1 year. Symptom-wise also he is stable. His dyspnea when doing mountains. He is kayaking and biking and is able to outpace his wife. During this time he does not check his pulse oximetry. He does not have one. I've advised that he can get one. He is asking about familial issues, research and also prognosis we went over all this. Of note CT scan of the chest shows also he has concomitant emphysema. He prefers to avoid medications but he is interested in taking an inhaler if it means improving his dyspnea. He is active with pulmonary fibrosis foundation patient support groupo. Of note he reports easy bruising in his forearm when he bumps into something. His wife thinks it's been there all along and is unchanged. He is wondering if that this is related to Ofev and asa. We did discuss about the bleeding risk of Ofev and the need to hold this before any procedure. Overall he tolerates Ofev just fine except for occasional diarrhea once a month. This is associated with fiber food    OV   02/26/2017  Chief Complaint  Patient presents with   Follow-up    PFT done today. Pt still taking OFEV and doing good on it. Denies any complaints with cough, SOB, or CP.    Follow-up idiopathic pulmonary fibrosis on Nintendanib since November 2017  This time his wife is not here with him.  He has now been on nintedanib for a year.  Overall he is tolerating it fine except for mild occasional diarrhea.  He continues to be very active working out in the yard and doing daily walks with a few miles.  He notices only mild dyspnea with this relieved by rest.  He does not feel his health has declined in the last 1 year.  He does have concomitant emphysema on the CT scan but not apparent on the PFTs.  I asked him to try Spiriva last time but it did not help.  He is dissatisfied with occasional albuterol use.  He did have pulmonary function test today which is documented  below.  The FVC is stable but there is a change in the DLCO but he is not feeling it.  He is on IPF registry by [facility];  he is due for a study visit today.  He is interested in further studies especially [place].  We discussed this in detail brief.  He has had genetics testing he got the result.  He is worried insurance will deny this.  He is participating in the pulmonary fibrosis foundation patient support group.  He is asking details about the March 17, 2017 local [place]   OV 05/22/2017  Chief Complaint  Patient presents with   Follow-up    PFT done today.  Pt stated he had the flu 05/05/17 and was not able to eat on 05/06/17 so he missed two OFEV doses. States he has had diarrhea a couple times since then but is now doing fine with help from immodium.  States his breathing has been doing good.      Follow-up idiopathic pulmonary fibrosis on Nintendanib since November 3728   65 year old male with idiopathic pulmonary fibrosis.  He is on stable dose of nintedanib.  He tells me earlier this month he had stomach flu and that he suddenly  had subjective sense of fever associated with diarrhea and vomiting.  This resolved in 2 days.  Since then he is lost a couple of pounds of weight.  He is worried about tolerating the nintedanib.  In addition primary function test shows a 3% decline in FVC and 2 years and a 12% decline in DLCO in 2 years.  He is now wondering if the drug is effective.  Overall other than the recent illness and occasional mild diarrhea he is tolerated off of just fine.  But he is now worried about the tolerance.  He has been advised by [facility] nursing support team about taking protein with nintedanib to offset side effects and he has questions about this.   OV 08/14/2017  Chief Complaint  Patient presents with   Follow-up    PFT done today. Pt states he has been doing good since last visit and denies any complaints.     Follow-up idiopathic pulmonary fibrosis surgical lung biopsy diagnosis February 02, 2016 on Nintendanib since November 2017  Mr. Flippen continues to do well.  At the time of last visit there was concern about weight loss and also progression in lung function.  However at that time he had a respiratory infection.  Today he comes back and tells me that he has gained 1 pound of weight.  His respiratory symptoms have resolved he is back to being at a stable mild exertional dyspnea.  He is extremely active.  Concordant with his symptom improvement his FVC has improved and his DLCO is stable.  Overall his pulmonary function test is stable since November 2018 at least with a DLCO although the DLCO has progressed since 2017.  His FVC has been stable all along since May 2017.  He is not interested in the [place] study because of the oral pill and can make his diarrhea potentially worse if he gets randomized to the drug.  He is interested in an upcoming inhaler trial of IPF.  He is active in the support group and is asking about the next meeting.  He is not on oxygen.        Ct Chest High  Resolution  Result Date: 10/25/2016 CLINICAL DATA:  Interstitial lung disease, former smoker, cough. Lung biopsy November 2017. EXAM:  CT CHEST WITHOUT CONTRAST TECHNIQUE: Multidetector CT imaging of the chest was performed following the standard protocol without intravenous contrast. High resolution imaging of the lungs, as well as inspiratory and expiratory imaging, was performed. COMPARISON:  11/23/2015 and 07/08/2015. FINDINGS: Cardiovascular: Vascular structures are unremarkable. Heart size normal. No pericardial effusion. Mediastinum/Nodes: Mediastinal lymph nodes are not enlarged by CT size criteria. Hilar regions are difficult to evaluate without IV contrast. No axillary adenopathy. Esophagus is grossly unremarkable. Lungs/Pleura: Centrilobular and paraseptal emphysema with a large bullous lesion in the left lower lobe. Post biopsy changes in the right upper, right middle and right lower lobes. Superimposed basilar predominant subpleural reticulation, ground-glass and traction bronchiectasis/bronchiolectasis, likely stable from baseline examination of 07/08/2015 when differences in slice collimation are considered. There may be scattered honeycombing. Mild subpleural nodularity along the right major fissure is unchanged from 07/08/2015, indicative of subpleural lymph nodes. No pleural fluid. Airway is unremarkable. Upper Abdomen: Visualized portions of the liver and gallbladder are unremarkable. Mild nodular thickening of both adrenal glands. Visualized portion of the right kidney is unremarkable. 2.5 cm low-attenuation lesion in the left kidney is likely a cyst. Visualized portions of the spleen, pancreas, stomach and bowel are grossly unremarkable. No upper abdominal adenopathy. Musculoskeletal: No worrisome lytic or sclerotic lesions. Degenerative changes in the spine. IMPRESSION: 1. Pulmonary parenchymal pattern of fibrosis is likely stable from 07/08/2015 when differences in slice collimation are  considered. Findings are in keeping with the pathologic diagnosis of usual interstitial pneumonitis. 2.  Emphysema (ICD10-J43.9). Electronically Signed   By: Lorin Picket M.D.   On: 10/25/2016 08:23   OV 02/26/2018  Subjective:  Patient ID: Shaun [name], male , DOB: July 08, 1955 , age 12 y.o. , MRN: [id number] , ADDRESS: [place] [address]   02/26/2018 -   Chief Complaint  Patient presents with   Follow-up    breathing well     HPI Shaun [name] 65 y.o. -follow-up familial IPF.  Last seen in May 2019.  After that in July 2019 he is screened for the electric face to inhaler study but he screen failed because he is standard of care CT scan of the chest from July 2018 was not per sponsored protocol or 2018 ATS protocol.  At this point in time he continues on nintedanib.  He is not taking proton pump inhibitor and wants to know if that is okay.  He feels he is stable.  There are no new issues.  The main thing is with nintedanib once a month he will have sporadic diarrhea.  He says he has been unable to control this but from a respiratory standpoint he feels he is stable.  There are no new medical issues.  He is up-to-date with his vaccines although immunization record review shows that he could benefit from Prevnar 13 which she has not had.  He continues to be actively engaged in the patient support group.   ROS - per HPI    OV 04/29/2018  Subjective:  Patient ID: Shaun [name], male , DOB: May 21, 1955 , age 26 y.o. , MRN: [id number] , ADDRESS: [place] [id number]   04/29/2018 -   Chief Complaint  Patient presents with   Follow-up    PFT performed today.  Pt states he is about the same since last visit.      HPI JULLIEN GRANQUIST 65 y.o. -returns for IPF follow-up.  He is on nintedanib.  He reports continued stability.  He  still has ongoing occasional issue with diarrhea that happened suddenly every few weeks.  It is so random.  It bothers  him and does affect his quality of life.  He takes Imodium in response.  We did discuss about taking Imodium proactively once a week.  He is now to give this a try.  He continues to be interested in pulmonary reserve study particularly face to inhaler study sponsored by [employer].  Our research department is getting ready and probably should be able to screen him in the next few to several weeks.  His last pulmonary function test was today and shows continued stability.  Last liver function test was November 2019 and was normal.  His walk test today is baseline     ROS   OV 11/29/2018  Subjective:  Patient ID: Shaun [name], male , DOB: [birth year] , age 49 y.o. , MRN: [id number] , ADDRESS: [place] [address]   11/29/2018 -   Chief Complaint  Patient presents with   IPF (idiopathic pulmonary fibrosis)    No issues, states medication is working for him.   Follow-up idiopathic pulmonary fibrosis on Nintendanib since November 2017  HPI Shaun [name] 65 y.o. -presents for follow-up of IPF.  Last visit was early in 2020.  And then the pandemic care.  He try to get down an inhaler trial of phase 2 for IPF - [place] but he screen failed because of technical issues with the spirometry.  He says overall he is doing well.  He is just following low risk activities.  He has a christening coming up for his grandson.  This will be outdoors in [place].  A total of 12 people will attend.  Everybody will be Masked.  He continues taking nintedanib and does not have any issues tolerating it.  Last liver function test was over 6 months ago.  He needs another one now.  He is also asking for a flu shot.  His symptom score and walking desaturation test showed continued stability.  OV 07/15/2019  Subjective:  Patient ID: Shaun [name], male , DOB: 1955/08/28 , age 22 y.o. , MRN: [id number] , ADDRESS: [place] [address]   07/15/2019 -   Chief Complaint  Patient  presents with   Follow-up    Follow-up idiopathic pulmonary fibrosis on Nintendanib since November 2017   HPI Shaun [name] 65 y.o. -presents for IPF follow-up.  Last seen August 2020.  This visit was supposed to have a pulmonary function test but this was not scheduled.  Patient overall feels stable.  Dyspnea is stable and minimal.  He walks on a regular basis 45 minutes.  He tolerates nintedanib but has mild symptoms.  He is asking about going to a lower dose but tells me that if the drug has been beneficial he says he can manage with the current symptoms.  He says he has had the COVID-19 vaccine at this point.  Last second dose was 1 week ago.  He is in the IPF pro registry.  He is not on any interventional trial.  He screen fell for an inhaler trial but that has been withdrawn because of safety reasons.  He was never a participant in the trial.  He is interested in other trials.  He reported that he is going to be 64 years old and will become a Medicare recipient in the next several months.  He is going to look for insurance options.  Walking desaturation test was stable.  He is okay waiting till the next visit for spirometry.     OV 01/14/2020   Subjective:  Patient ID: Shaun [name], male , DOB: [birth year], age 44 y.o. years. , MRN: [id number],  ADDRESS: [place] [address] PCP  Mayra Neer, MD Providers : Treatment Team:  Attending Provider: Brand Males, MD Patient Care Team: Mayra Neer, MD as PCP - General (Family Medicine)    Chief Complaint  Patient presents with   Follow-up    PFT performed 10/12.  Pt states he has been doing okay since last visit. States that he is still having problems with diarrhea which he believes has become worse.    IPF diagnosed in November 2017.   HPI Shaun [name] 65 y.o. -returns for follow-up of his pulmonary fibrosis IPF.  He is coming up for years since diagnosis.  Overall he reports that the  stability in his symptoms see below.  He tells me that when he goes for a 35/45-minute walk he has difficulty with inclines.  He did say this is overall stable but may be compared to a year or 2 ago it is slightly worse.  Review of his pulmonary function test shows compared to 2017 there are some decline in both FVC and DLCO and compared to early 2020 there is slight decline in DLCO although compared to most recent numbers his DLCO is better.  His weight is stable.  Overall he is tolerating the nintedanib fine but he is having diarrhea.  The diarrhea is uncomfortable but nevertheless is only 2 episodes a week.  Last visit I advised him to take Imodium once a week on a scheduled basis but he says he prefers not to take medicine and he takes it as needed.  If this is then restarting to him using Imodium twice a week.  We discussed and is willing to try once a week schedule.  The other alternative he wanted to discuss was reducing the nintedanib to 100 mg twice daily.  I cautioned against this given the fact his lungs have had only a slight progression nevertheless there is progression and with the lower dose of nintedanib may be it is less effective.  Therefore is going to try the Imodium route.  He has upcoming colonoscopy because of his diarrhea.  We advised the to stop the nintedanib 2 days before and 1-2 days after the procedure.  Similarly for dental procedure he will stop the nintedanib 2 days before and resume 1 or 2 days after the procedure.   He is interested in research protocol.  We discussed several studies.  He is not interested in the Phase 2 [place] study.  We discussed promedior IV infusion study.  He wants to think about this.  We will send in the consent form.  This upcoming study with an inhaler that is already preapproved in the market for pulmonary hypertension.  Is called treprostinil.  If we get the study then he might be interested in this.   OV 12/22/2020  Subjective:  Patient ID:  Shaun [name], male , DOB: 1955/08/08 , age 46 y.o. , MRN: [id number] , ADDRESS: [place] [id number] PCP Mayra Neer, MD Patient Care Team: Mayra Neer, MD as PCP - General (Family Medicine)  This Provider for this visit: Treatment Team:  Attending Provider: Brand Males, MD    12/22/2020 -   Chief Complaint  Patient presents with   Follow-up  PFT performed today. Pt states he is about the same since last visit. Currently has some problems with sinus drainage.    IPF diagnosed in November 2017.  Family of pulmonary fibrosis - last CT March 2020 - IPF-Pro registre HPI Shaun [name] 65 y.o. -returns for follow-up.  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  Overall he is doing stable.  He is adjusted his diet and his diarrhea with nintedanib is under much better control.  His weight is stable.  His symptom scores are stable.  Side effect from diarrhea improved.  He had a 6-minute walk test and this was normal.  He had liver function test today and it is normal.  He had questions about his prognosis.  I explained that given stability of ILD that more than likely is going to be stable in the next 1 year and beyond that it is difficult to predict.  This is based on Calculator.  We looked at his trend line with the FVC and DLCO.  His DLCO this is improved compared to last year.  Therefore overall it appears that he had a decline between 2017 and 2018 but since late 2018 he has been stable till the current time September 2022.  He is always been interested in clinical trials as a care option.  He was given the option of inhaler treprostinil study called [facility]Marland Kitchen  He did not want to do oral drug study.  He has reviewed the consent form he is not interested in this because he has to take the inhaler 4 times each day and more than 9 times each time.  We discussed IV recombinant Pentraxin with goodphase 2 to safety  and efficacy data .  I shorten the publication images of the data on this.  I have given him that.  Also give him a detailed consent form forhis for review.  He will read and reflect on this.       SYMPTOM SCALE - ILD 11/29/2018 140# 07/15/2019  01/14/2020 138# 12/22/2020 138#  O2 use Room air   ra  Shortness of Breath 0 -> 5 scale with 5 being worst (score 6 If unable to do)     At rest 0 0 0 0  Simple tasks - showers, clothes change, eating, shaving 0 0 0 0  Household (dishes, doing bed, laundry) 0 0  0  Shopping 0 0 0 0  Walking keeping up with others of same age 56 0 00 0  Walking up Stairs _0 Total (40 - 48) Dyspnea Score _1 How bad is your cough? 0 0 1 0  How bad is your fatigue 0 _2 nausea  1 1 0  vomit  0 0 0  diarhea  _3 anxity  _4 depresion  1 1 0     Simple office walk - 250 feet x 3 laps 02/26/2018  04/29/2018  11/29/2018  07/15/2019   O2 used Room air Room air Room air ra  Number laps completed _5 Comments about pace Good brisk     Resting Pulse Ox/HR 100% and 74/min 100% and 86/min     100% and 76/min 100%and 79/min  Final Pulse Ox/HR 96% and 96/min 97% and 93/min 96% and 99/min 96% and 111/min  Desaturated </= 88% no no no no  Desaturated <= 3% points  yes Yes, 3 Yes, 4 Yes, 4 points  Got Tachycardic >/= 90/min yes yes yes yes  Symptoms at end of test No dyspnea No dyspnea  asympt  Miscellaneous comments x x         Results for OSMAN, CALZADILLA (MRN [id number]) as of 11/29/2018 09:56  Ref. Range 08/02/2015 10:41 10/25/2016 08:44 02/26/2017 09:06 05/22/2017 08:59 08/14/2017 12:41 10/19/2017 12:23 04/29/2018 10:47 05/21/2018 10:11 07/04/2018 08:59  FVC-Pre Latest Units: L 4.00 4.16 4.04 3.91 4.11  4.16  4.12  FVC-%Pred-Pre Latest Units: % 93 98 95 92 97  98  102  Results for HALLIE, ISHIDA (MRN [id number]) as of 11/29/2018 09:56  Ref. Range 08/02/2015 10:41 10/25/2016 08:44 02/26/2017 09:06 05/22/2017 08:59 08/14/2017 12:41 10/19/2017  12:23 04/29/2018 10:47 05/21/2018 10:11 07/04/2018 08:59  DLCO unc Latest Units: ml/min/mmHg 17.33 17.25 14.23 15.14 14.42 13.93 14.84 15.39 14.04  DLCO unc % pred Latest Units: % 61 60 50 53 50 49 52 64 58     PFT  PFT Results Latest Ref Rng & Units 12/22/2020 06/03/2020 01/13/2020 10/14/2019 07/04/2018 05/21/2018 04/29/2018  FVC-Pre L 4.03 3.95 3.95 4.07 4.12 - 4.16  FVC-Predicted Pre % 101 99 99 102 102 - 98  FVC-Post L - - - - - - -  FVC-Predicted Post % - - - - - - -  Pre FEV1/FVC % % 82 82 90 88 88 - 85  Post FEV1/FCV % % - - - - - - -  FEV1-Pre L 3.30 3.22 3.55 3.57 3.61 - 3.55  FEV1-Predicted Pre % 112 109 119 119 119 - 112  FEV1-Post L - - - - - - -  DLCO uncorrected ml/min/mmHg 14.65 15.03 13.56 12.16 14.04 15.39 14.84  DLCO UNC% % 62 63 57 51 58 64 52  DLCO corrected ml/min/mmHg 14.65 15.03 13.56 12.16 13.34 - -  DLCO COR %Predicted % 62 63 57 51 55 - -  DLVA Predicted % 74 66 65 61 76 83 75  TLC L - - - - - - -  TLC % Predicted % - - - - - - -  RV % Predicted % - - - - - - -    Six Minute Walk - 11/18/20 1449                Six Minute Walk    Medications taken before test (dose and time) Ofev 150m, aspirin 826m Chlor-tab 34m234mnd a multivitamin were all taken this morning around 650am.     Supplemental oxygen during test? No     Lap distance in meters  34 meters     Laps Completed 16     Partial lap (in meters) 0 meters     Baseline BP (sitting) 124/76     Baseline Heartrate 74     Baseline Dyspnea (Borg Scale) 1     Baseline Fatigue (Borg Scale) 1     Baseline SPO2 96 %          End of Test Values     BP (sitting) 128/84     Heartrate 82     Dyspnea (Borg Scale) 2     Fatigue (Borg Scale) 1     SPO2 95 %          2 Minutes Post Walk Values    BP (sitting) 128/80     Heartrate 72     SPO2 96 %     Stopped or paused before six minutes? No  Interpretation    Distance completed 544 meters     Tech Comments: Patient was able to complete 70m at a steady,  brisk pace. He denied any leg pain or swelling, chest pain or SOB during or after walk. No O2 was needed during or after walk.          Recent Labs  Lab 12/22/20 1452  AST 24  ALT 14  ALKPHOS 575 BILITOT 0.5  PROT 6.8  ALBUMIN 4.1      has a past medical history of Dyspnea, Family history of colon cancer, Family history of prostate cancer, Family history of pulmonary fibrosis, Hyperlipemia, and Pneumonia.   reports that he has never smoked. He has never used smokeless tobacco.  Past Surgical History:  Procedure Laterality Date   COLONOSCOPY WITH PROPOFOL N/A 09/21/2014   Procedure: COLONOSCOPY WITH PROPOFOL;  Surgeon: MGarlan Fair MD;  Location: [facility];  Service: Endoscopy;  Laterality: N/A;   FOREARM SURGERY     excision of birth mark"child"   HERNIA REPAIR     RIH-open   KNEE ARTHROTOMY Left    bursa excision   LUNG BIOPSY Right 02/02/2016   Procedure: LUNG BIOPSY;  Surgeon: SMelrose Nakayama MD;  [facility]  Service: Thoracic;  Laterality: Right;   NASAL SEPTUM SURGERY     VIDEO ASSISTED THORACOSCOPY Right 02/02/2016   Procedure: VIDEO ASSISTED THORACOSCOPY;  Surgeon: SMelrose Nakayama MD;  [facility]  Service: Thoracic;  Laterality: Right;   WRIST GANGLION EXCISION Left     Allergies  Allergen Reactions   Penicillins Other (See Comments)    UNSPECIFIED REACTION OF CHILDHOOD Has patient had a PCN reaction causing immediate rash, facial/tongue/throat swelling, SOB or lightheadedness with hypotension:unsure Has patient had a PCN reaction causing severe rash involving mucus membranes or skin necrosis:unsure Has patient had a PCN reaction that required hospitalization:No Has patient had a PCN reaction occurring within the last 10 years:No If all of the above answers are "NO", then may proceed with Cephalosporin use.     Immunization History  Administered Date(s) Administered   Influenza,inj,Quad PF,6+ Mos 12/09/2016, 11/26/2017,  11/29/2018, 12/31/2019   Influenza-Unspecified 11/02/2015, 12/09/2016   Moderna SARS-COV2 Booster Vaccination 01/31/2020   Moderna Sars-Covid-2 Vaccination 06/05/2019, 07/08/2019   Pneumococcal Conjugate-13 02/26/2018   Pneumococcal Polysaccharide-23 02/05/2016   Zoster Recombinat (Shingrix) 12/09/2016, 02/10/2017   Zoster, Live 11/02/2015    Family History  Problem Relation Age of Onset   Lung cancer Mother    Colon cancer Mother 420  Pulmonary fibrosis Father        dx in his 836s  Prostate cancer Brother 771  COPD Paternal Grandfather    Other Paternal Uncle        possible lung dx; died around age 65  Other Cousin        possible lung dx, died in her 554s paternal first cousin   Healthy Son      Current Outpatient Medications:    albuterol (PROVENTIL HFA;VENTOLIN HFA) 108 (90 Base) MCG/ACT inhaler, Inhale 2 puffs into the lungs every 6 (six) hours as needed for wheezing or shortness of breath., Disp: 1 Inhaler, Rfl: 5   aspirin EC 81 MG tablet, Take 81 mg by mouth daily., Disp: , Rfl:    Chlorpheniramine-Pseudoeph 4-60 MG TABS, Take 1 tablet by mouth 2 (two) times daily as needed (for allergies.)., Disp: , Rfl:    Multiple Vitamin (MULTIVITAMIN WITH MINERALS) TABS tablet, Take 1  tablet by mouth daily., Disp: , Rfl:    Nintedanib (OFEV) 150 MG CAPS, Take 1 capsule (150 mg total) by mouth 2 (two) times daily with a meal. Fatima Sanger information in notes., Disp: 180 capsule, Rfl: 3   simvastatin (ZOCOR) 10 MG tablet, Take 10 mg by mouth daily at 6 PM. , Disp: , Rfl: 0      Objective:   Vitals:   12/22/20 1603  BP: 116/60  Pulse: 75  Temp: 97.7 F (36.5 C)  TempSrc: Oral  SpO2: 98%  Weight: 138 lb (62.6 kg)  Height: 5' 6" (1.676 m)    Estimated body mass index is 22.27 kg/m as calculated from the following:   Height as of this encounter: 5' 6" (1.676 m).   Weight as of this encounter: 138 lb (62.6 kg).  _0 @  Filed Weights   12/22/20 1603  Weight: 138  lb (62.6 kg)     Physical ExamGeneral: No distress. Looks well Neuro: Alert and Oriented x 3. GCS 15. Speech normal Psych: Pleasant Resp:  Barrel Chest - no.  Wheeze - no, Crackles - yes bibasal, No overt respiratory distress CVS: Normal heart sounds. Murmurs - no Ext: Stigmata of Connective Tissue Disease - no HEENT: Normal upper airway. PEERL +. No post nasal drip        Assessment:       ICD-10-CM   1. IPF (idiopathic pulmonary fibrosis) ([place])  J84.112     2. Familial idiopathic pulmonary fibrosis ([place])  J84.112     3. Therapeutic drug monitoring  Z51.81          Plan:     Patient Instructions     ICD-10-CM   1. IPF (idiopathic pulmonary fibrosis) ([place])  J84.112   2. Encounter for therapeutic drug monitoring  Z51.81   3. Familial idiopathic pulmonary fibrosis ([facility])  J84.112       Stable IPF on Nintedanib but slightly progressive slowly over time since 2017 -> latter part of 2018 and then stable through Sept 2022 on PFT  Mild  from ofev but weight stable  LFT normal 12/22/2020   Thanks for participating in the IPF-pro registry study  Respect reluctance for TETON study due to inhaler schedule  Discussed starscape study  Plan  - continue Nintednaib (Ofev) as before - mRNA booster bivalen mRNA vaccine this week and then high dose flu shot 1-2 weeks later  - take consent form for [employer] study  Followup  - 6 months do spirometry and dlco  - return to clinic (30 min slot) for followup in 6 months - return sooner if needed    SIGNATURE    Dr. Brand Males, M.D., F.C.C.P,  Pulmonary and Critical Care Medicine Staff Physician, [place] Director - Interstitial Lung Disease  Program  [facility] [place], [place], [address]  Pager: [phone], If no answer or between  15:00h - 7:00h: call [phone] Telephone: [phone]  5:07 PM 12/22/2020

## 2020-12-23 NOTE — Research (Signed)
LATE ENTRY:  IPF-PRO Registry  Purpose: To collect data and biological samples that will support future research studies.  The goal of the registry is to research the current approaches to diagnosis, treatment, and progression of IPF. In addition, the registry will analyze participant characteristics, assess quality of life, describe participants interactions with the health care system, determine IPF treatment practices across multiple institutions, and utilize biological samples to identify disease biomarkers.    [facility] [place] Coordinator note :   This visit for [name] with DOB: [birth year] on 12/23/2020 for the above protocol is Visit/Encounter # Visit 9  and is for purpose of research.   The consent for this encounter is under Protocol Version  Protocol Amendment 4 (Version Date: 13 December 2017)   IB: N/A   ICF:  [employer] IR[employer] Approved Version 07 Mar 2018 Revised 07 Mar 2018 (**version for ILD-PRO subjects, not for IPF-Pro subjects**)   IPF-Pro: Addendum Consent To Participate In [employer]; [employer] IR[employer] Approved Version 22 Jan 2018 Revised 22 Jan 2018 (*only for IPF-PRO subjects needing to have DNA redrawn*)   Subject expressed continued interest and consent in continuing as a study subject. Subject confirmed that there was  no change in contact information (e.g. address, telephone, email). Subject thanked for participation in research and contribution to science.  The Subject was informed that the PI Dr. Chase Caller continues to have oversight of the subject's visits and course  through relevant discussions, reviews and also specifically of this visit by routing of this note to the [facility].  Questionnaires and bloodwork collected per the above mentioned protocol. Refer to the subjects paper source binder for further details of the visit.   Research Assistant Reino Bellis conducted this visit but does not have Epic access at this time to  complete the progress note.   Signed by Marianna Bing, CMA, [place] Coordinator  [facility]  [place], [place] 2:26 PM 12/23/2020

## 2021-06-24 ENCOUNTER — Other Ambulatory Visit: Payer: Self-pay | Admitting: Internal Medicine

## 2021-06-24 DIAGNOSIS — J84112 Idiopathic pulmonary fibrosis: Secondary | ICD-10-CM

## 2021-07-04 ENCOUNTER — Ambulatory Visit: Payer: BLUE CROSS/BLUE SHIELD | Admitting: Internal Medicine

## 2021-07-12 ENCOUNTER — Other Ambulatory Visit: Payer: Self-pay | Admitting: *Deleted

## 2021-07-12 ENCOUNTER — Ambulatory Visit (INDEPENDENT_AMBULATORY_CARE_PROVIDER_SITE_OTHER): Payer: Medicare Other | Admitting: Internal Medicine

## 2021-07-12 DIAGNOSIS — J84112 Idiopathic pulmonary fibrosis: Secondary | ICD-10-CM

## 2021-07-12 LAB — PULMONARY FUNCTION TEST
DL/VA % pred: 73 %
DL/VA: 3.09 ml/min/mmHg/L
DLCO cor % pred: 62 %
DLCO cor: 14.62 ml/min/mmHg
DLCO unc % pred: 62 %
DLCO unc: 14.62 ml/min/mmHg
FEF 25-75 Pre: 3.11 L/sec
FEF2575-%Pred-Pre: 134 %
FEV1-%Pred-Pre: 108 %
FEV1-Pre: 3.16 L
FEV1FVC-%Pred-Pre: 107 %
FEV6-%Pred-Pre: 106 %
FEV6-Pre: 3.94 L
FEV6FVC-%Pred-Pre: 106 %
FVC-%Pred-Pre: 100 %
FVC-Pre: 3.94 L
Pre FEV1/FVC ratio: 80 %
Pre FEV6/FVC Ratio: 100 %

## 2021-07-12 NOTE — Patient Instructions (Signed)
Spirometry/DLCO performed today. ?

## 2021-07-12 NOTE — Progress Notes (Signed)
Spirometry/DLCO performed today. ?

## 2021-07-13 ENCOUNTER — Telehealth: Payer: Self-pay | Admitting: Internal Medicine

## 2021-07-13 NOTE — Telephone Encounter (Signed)
Called [facility] and spoke with [facility] pharmacist letting her know that pt was completely out of his OFEV and we needed to get a new Rx sent to pt ASAP. Provided her Rx instructions and she said that she would get the med out to pt ASAP. ? ?Attempted to call pt to let him know this information but unable to reach. Left pt a detailed message letting him know this was done. Nothing further needed. ?

## 2021-07-14 ENCOUNTER — Telehealth: Payer: Self-pay | Admitting: Internal Medicine

## 2021-07-14 DIAGNOSIS — Z5181 Encounter for therapeutic drug level monitoring: Secondary | ICD-10-CM

## 2021-07-14 NOTE — Telephone Encounter (Signed)
Called patient but he did not answer. Left message for him to call us back.  

## 2021-07-14 NOTE — Telephone Encounter (Signed)
I have placed the order for LFT ?

## 2021-07-21 ENCOUNTER — Ambulatory Visit: Payer: BLUE CROSS/BLUE SHIELD | Admitting: Internal Medicine

## 2021-07-25 ENCOUNTER — Encounter: Payer: Self-pay | Admitting: Adult Health

## 2021-07-25 ENCOUNTER — Ambulatory Visit (INDEPENDENT_AMBULATORY_CARE_PROVIDER_SITE_OTHER): Payer: Medicare Other | Admitting: Adult Health

## 2021-07-25 ENCOUNTER — Encounter (INDEPENDENT_AMBULATORY_CARE_PROVIDER_SITE_OTHER): Payer: Medicare Other | Admitting: *Deleted

## 2021-07-25 DIAGNOSIS — Z5181 Encounter for therapeutic drug level monitoring: Secondary | ICD-10-CM

## 2021-07-25 DIAGNOSIS — R5381 Other malaise: Secondary | ICD-10-CM | POA: Diagnosis not present

## 2021-07-25 DIAGNOSIS — J84112 Idiopathic pulmonary fibrosis: Secondary | ICD-10-CM | POA: Diagnosis not present

## 2021-07-25 DIAGNOSIS — R197 Diarrhea, unspecified: Secondary | ICD-10-CM | POA: Diagnosis not present

## 2021-07-25 DIAGNOSIS — Z006 Encounter for examination for normal comparison and control in clinical research program: Secondary | ICD-10-CM

## 2021-07-25 LAB — HEPATIC FUNCTION PANEL
ALT: 19 U/L (ref 0–53)
AST: 28 U/L (ref 0–37)
Albumin: 4.3 g/dL (ref 3.5–5.2)
Alkaline Phosphatase: 58 U/L (ref 39–117)
Bilirubin, Direct: 0.1 mg/dL (ref 0.0–0.3)
Total Bilirubin: 0.5 mg/dL (ref 0.2–1.2)
Total Protein: 6.9 g/dL (ref 6.0–8.3)

## 2021-07-25 NOTE — Research (Signed)
IPF-PRO Registry ? ?Purpose: To collect data and biological samples that will support future research studies.  ?The goal of the registry is to research the current approaches to diagnosis, treatment, and progression of IPF. In addition, the registry will analyze participant characteristics, assess quality of life, describe participants interactions with the health care system, determine IPF treatment practices across multiple institutions, and utilize biological samples to identify disease biomarkers.  ? ?Scientist, physiological / Research RN note : This visit for [name] with DOB: October 29, 1955 on 07/25/2021 for the above protocol is Visit/Encounter #60 Month and is for purpose of research.  ? ?The consent for this encounter is under Protocol Version Amendment 4 (Version Date: [id number]) and is currently IRB approved.  ?Subject expressed continued interest and consent in continuing as a study subject. Subject confirmed that there was no change in contact information (e.g. address, telephone, email). Subject thanked for participation in research and contribution to science.  ? ?During this visit on 07/25/2021, the subject completed the blood work and questionnaires per the above referenced protocol. Please refer to the subject's paper source binder for further details. ? ?Signed by ? ?[name]  ?Research Assistant  ?[facility]  ?[place], [place] ?3:30 PM 07/25/2021 ? ?

## 2021-07-25 NOTE — Patient Instructions (Addendum)
Continue on OFEV 152m Twice daily   ?Immodium As needed   ?High protein diet .  ?Keep weight log .  ?Labs today .  ?Advance activity and exercise as able.  ?Follow up with Dr. RChase Callerin ILD clinic with Spirometry with DLCO and 6 min walk test.  ? ? ? ?

## 2021-07-25 NOTE — Progress Notes (Signed)
? ?_0  ID: Shaun [name], male    DOB: [birth year], 66 y.o.   MRN: [id number] ? ?Chief Complaint  ?Patient presents with  ? Follow-up  ? ? ?Referring provider: ?Mayra Neer, MD ? ?HPI: ?66 year old male never smoker followed for IPF, initially seen for pulmonary consult 2017 ?Family history of IPF with father diagnosed at age 77 ? ?TEST/EVENTS :  ?PFTs January 13, 2020 FEV1 119%, ratio 90, FVC 99%, DLCO 57% ?  ?April 2017 and November 2017 extensive autoimmune and vascular this panel is negative ?  ?- High resolution CT scan of the chest August 2017: UIP pattern that is classic - personally visualized. There is also 5 mm right lower lobe lung nodule and a 5.9 cm left-sided lower bulla [stable compared to April 2017] ? - Pulmonary function test 08/02/2015: Isolated reduction in diffusion capacity 17.33/61%. Otherwise FVC 4 L/93% and normal. Total lung capacity 5.9 L/91% and normal ?  ?- Walking desaturation test 185 feet ?3 laps on room air in the office 02/22/2016: Did not desaturate but gets symptomatic ?  ?Histopathology at diagnosis: UIP 02/02/2016 read at [place] ?  ?Ofev started 2018 ? ?PFTs  June 03, 2020 showed FEV1 at 109%, ratio 82, FVC 99%, DLCO 63%. ? ? ?07/25/2021 Follow up : IPF  ?Patient presents for a 59-monthfollow-up.  Patient has underlying IPF.  Patient says overall breathing is doing about the same.  He gets short of breath with heavy activities but tries to remain active every day.  Typically walks about 30 to 40 minutes every day.  Recently was able to go hiking but did notice that when he was going up an incline/he will he did get some shortness of breath and had to rest.  Patient says his weight has been stable.  Patient remains on Ofev.  Patient says his chronic diarrhea has been managed with Imodium.  Typically is only have to take it about every 3 days or so.  And only takes a half a tablet.  He feels that this has become under better control. ?Patient says he does check his  oxygen levels at home.  Has not noticed any lower oxygen levels. ?PFTs done on July 12, 2021 are essentially stable.  FEV1 at 108%, ratio 80, FVC 100%, DLCO 62%.  This is very similar to his lung function testing in March 2022. ?Patient is talking to [facility] research team  and has been referred for  the ILD pro registry. ? ? ? ?Allergies  ?Allergen Reactions  ? Penicillins Other (See Comments)  ?  UNSPECIFIED REACTION OF CHILDHOOD ?Has patient had a PCN reaction causing immediate rash, facial/tongue/throat swelling, SOB or lightheadedness with hypotension:unsure ?Has patient had a PCN reaction causing severe rash involving mucus membranes or skin necrosis:unsure ?Has patient had a PCN reaction that required hospitalization:No ?Has patient had a PCN reaction occurring within the last 10 years:No ?If all of the above answers are "NO", then may proceed with Cephalosporin use. ?  ? ? ?Immunization History  ?Administered Date(s) Administered  ? Fluad Quad(high Dose 65+) 12/02/2020  ? Influenza,inj,Quad PF,6+ Mos 12/09/2016, 11/26/2017, 11/29/2018, 12/31/2019  ? Influenza-Unspecified 11/02/2015, 12/09/2016  ? Moderna SARS-COV2 Booster Vaccination 01/31/2020  ? Moderna Sars-Covid-2 Vaccination 06/05/2019, 07/08/2019, 01/31/2020, 07/05/2020  ? Pneumococcal Conjugate-13 02/26/2018  ? Pneumococcal Polysaccharide-23 02/05/2016  ? Zoster Recombinat (Shingrix) 12/09/2016, 02/10/2017  ? Zoster, Live 11/02/2015  ? ? ?Past Medical History:  ?Diagnosis Date  ? Dyspnea   ? Family history of colon cancer   ?  Family history of prostate cancer   ? Family history of pulmonary fibrosis   ? Hyperlipemia   ? Pneumonia   ? ? ?Tobacco History: ?Social History  ? ?Tobacco Use  ?Smoking Status Never  ?Smokeless Tobacco Never  ? ?Counseling given: Not Answered ? ? ?Outpatient Medications Prior to Visit  ?Medication Sig Dispense Refill  ? albuterol (PROVENTIL HFA;VENTOLIN HFA) 108 (90 Base) MCG/ACT inhaler Inhale 2 puffs into the lungs every 6  (six) hours as needed for wheezing or shortness of breath. 1 Inhaler 5  ? aspirin EC 81 MG tablet Take 81 mg by mouth daily.    ? Chlorpheniramine-Pseudoeph 4-60 MG TABS Take 1 tablet by mouth 2 (two) times daily as needed (for allergies.).    ? Multiple Vitamin (MULTIVITAMIN WITH MINERALS) TABS tablet Take 1 tablet by mouth daily.    ? Nintedanib (OFEV) 150 MG CAPS Take 1 capsule (150 mg total) by mouth 2 (two) times daily with a meal. Fatima Sanger information in notes. 180 capsule 3  ? simvastatin (ZOCOR) 10 MG tablet Take 10 mg by mouth daily at 6 PM.   0  ? ?No facility-administered medications prior to visit.  ? ? ? ?Review of Systems:  ? ?Constitutional:   No  weight loss, night sweats,  Fevers, chills,  ?+fatigue, or  lassitude. ? ?HEENT:   No headaches,  Difficulty swallowing,  Tooth/dental problems, or  Sore throat,  ?              No sneezing, itching, ear ache, nasal congestion, post nasal drip,  ? ?CV:  No chest pain,  Orthopnea, PND, swelling in lower extremities, anasarca, dizziness, palpitations, syncope.  ? ?GI  No heartburn, indigestion, abdominal pain, nausea, vomiting, diarrhea, change in bowel habits, loss of appetite, bloody stools.  ? ?Resp:  No non-productive cough,  No coughing up of blood.  No change in color of mucus.  No wheezing.  No chest wall deformity ? ?Skin: no rash or lesions. ? ?GU: no dysuria, change in color of urine, no urgency or frequency.  No flank pain, no hematuria  ? ?MS:  No joint pain or swelling.  No decreased range of motion.  No back pain. ? ? ? ?Physical Exam ? ?BP 106/60 (BP Location: Left Arm, Patient Position: Sitting, Cuff Size: Normal)   Pulse 80   Temp 98.1 ?F (36.7 ?C) (Oral)   Ht _0  (1.702 m)   Wt 143 lb 12.8 oz (65.2 kg)   SpO2 95%   BMI 22.52 kg/m?  ? ?GEN: A/Ox3; pleasant , NAD, well nourished  ?  ?HEENT:  [place]/AT,  NOSE-clear, THROAT-clear, no lesions, no postnasal drip or exudate noted.  ? ?NECK:  Supple w/ fair ROM; no JVD; normal carotid impulses w/o  bruits; no thyromegaly or nodules palpated; no lymphadenopathy.   ? ?RESP bibasilar crackles  no accessory muscle use, no dullness to percussion ? ?CARD:  RRR, no m/r/g, no peripheral edema, pulses intact, no cyanosis or clubbing. ? ?GI:   Soft & nt; nml bowel sounds; no organomegaly or masses detected.  ? ?Musco: Warm bil, no deformities or joint swelling noted.  ? ?Neuro: alert, no focal deficits noted.   ? ?Skin: Warm, no lesions or rashes ? ? ? ?Lab Results: ? ? ? ?BNP ?No results found for: BNP ? ?ProBNP ?No results found for: PROBNP ? ?Imaging: ?No results found. ? ? ? ? ?  Latest Ref Rng & Units 07/12/2021  ? 11:46 AM 12/22/2020  ?  2:13 PM 06/03/2020  ? 11:27 AM 01/13/2020  ?  8:50 AM 10/14/2019  ?  8:49 AM 07/04/2018  ?  8:59 AM 05/21/2018  ? 10:11 AM  ?PFT Results  ?FVC-Pre L 3.94  P 4.03   3.95   3.95   4.07   4.12     ?FVC-Predicted Pre % 100  P 101   99   99   102   102     ?Pre FEV1/FVC % % 80  P 82   82   90   88   88     ?FEV1-Pre L 3.16  P 3.30   3.22   3.55   3.57   3.61     ?FEV1-Predicted Pre % 108  P 112   109   119   119   119     ?DLCO uncorrected ml/min/mmHg 14.62  P 14.65   15.03   13.56   12.16   14.04   15.39    ?DLCO UNC% % 62  P 62   63   57   51   58   64    ?DLCO corrected ml/min/mmHg 14.62  P 14.65   15.03   13.56   12.16   13.34     ?DLCO COR %Predicted % 62  P 62   63   57   51   55     ?DLVA Predicted % 73  P 74   66   65   61   76   83    ?  ?P Preliminary result  ? ? ?No results found for: NITRICOXIDE ? ? ? ? ? ?Assessment & Plan:  ? ?No problem-specific Assessment & Plan notes found for this encounter. ? ? ? ? ?Rexene Edison, NP ?07/25/2021 ? ?

## 2021-07-27 NOTE — Assessment & Plan Note (Signed)
On chronic Ofev.  Check LFTs today. ?

## 2021-07-27 NOTE — Assessment & Plan Note (Signed)
Chronic diarrhea most likely secondary to Ofev.  Seems to be under better control.  Continue with Imodium as needed.  Continue with weight log.  Weight seems to be stable. ?

## 2021-07-27 NOTE — Assessment & Plan Note (Addendum)
Clinically appears to be stable.  Patient is very active is encouraged to continue with his daily activities and exercise. ?Seems to be tolerating Ofev. ?Pulmonary function testing is stable. ?Continue on current regimen ?Follow-up in 6 months. ? ?Plan  ?Patient Instructions  ?Continue on OFEV 122m Twice daily   ?Immodium As needed   ?High protein diet .  ?Keep weight log .  ?Labs today .  ?Advance activity and exercise as able.  ?Follow up with Dr. RChase Callerin ILD clinic with Spirometry with DLCO and 6 min walk test.  ? ? ? ?  ?] ?

## 2021-07-27 NOTE — Assessment & Plan Note (Signed)
Activity as tolerated.  Exercise as able ?

## 2021-07-27 NOTE — Addendum Note (Signed)
Addended by: Vanessa Barbara on: 07/27/2021 12:02 PM ? ? Modules accepted: Orders ? ?

## 2021-12-26 ENCOUNTER — Other Ambulatory Visit: Payer: Self-pay | Admitting: *Deleted

## 2021-12-26 DIAGNOSIS — J84112 Idiopathic pulmonary fibrosis: Secondary | ICD-10-CM

## 2021-12-27 ENCOUNTER — Ambulatory Visit (INDEPENDENT_AMBULATORY_CARE_PROVIDER_SITE_OTHER): Payer: Medicare Other | Admitting: Internal Medicine

## 2021-12-27 DIAGNOSIS — J84112 Idiopathic pulmonary fibrosis: Secondary | ICD-10-CM

## 2021-12-27 LAB — PULMONARY FUNCTION TEST
DL/VA % pred: 61 %
DL/VA: 2.58 ml/min/mmHg/L
DLCO cor % pred: 52 %
DLCO cor: 12.42 ml/min/mmHg
DLCO unc % pred: 52 %
DLCO unc: 12.42 ml/min/mmHg
FEF 25-75 Pre: 4.51 L/sec
FEF2575-%Pred-Pre: 195 %
FEV1-%Pred-Pre: 115 %
FEV1-Pre: 3.35 L
FEV1FVC-%Pred-Pre: 117 %
FEV6-%Pred-Pre: 103 %
FEV6-Pre: 3.83 L
FEV6FVC-%Pred-Pre: 106 %
FVC-%Pred-Pre: 97 %
FVC-Pre: 3.83 L
Pre FEV1/FVC ratio: 87 %
Pre FEV6/FVC Ratio: 100 %

## 2021-12-27 NOTE — Progress Notes (Signed)
Spirometry/DLCO performed today.

## 2021-12-27 NOTE — Patient Instructions (Signed)
Spirometry/DLCO performed today.

## 2022-01-03 ENCOUNTER — Ambulatory Visit (INDEPENDENT_AMBULATORY_CARE_PROVIDER_SITE_OTHER): Payer: Medicare Other | Admitting: Internal Medicine

## 2022-01-03 ENCOUNTER — Encounter: Payer: Medicare Other | Admitting: Internal Medicine

## 2022-01-03 ENCOUNTER — Encounter: Payer: Self-pay | Admitting: Internal Medicine

## 2022-01-03 ENCOUNTER — Ambulatory Visit (INDEPENDENT_AMBULATORY_CARE_PROVIDER_SITE_OTHER): Payer: Medicare Other

## 2022-01-03 ENCOUNTER — Telehealth: Payer: Self-pay | Admitting: Internal Medicine

## 2022-01-03 VITALS — BP 128/68 | HR 65 | Temp 98.0°F | Ht 67.0 in | Wt 142.0 lb

## 2022-01-03 DIAGNOSIS — Z7185 Encounter for immunization safety counseling: Secondary | ICD-10-CM

## 2022-01-03 DIAGNOSIS — K521 Toxic gastroenteritis and colitis: Secondary | ICD-10-CM

## 2022-01-03 DIAGNOSIS — Z23 Encounter for immunization: Secondary | ICD-10-CM

## 2022-01-03 DIAGNOSIS — J84112 Idiopathic pulmonary fibrosis: Secondary | ICD-10-CM | POA: Diagnosis not present

## 2022-01-03 DIAGNOSIS — Z5181 Encounter for therapeutic drug level monitoring: Secondary | ICD-10-CM

## 2022-01-03 DIAGNOSIS — Z006 Encounter for examination for normal comparison and control in clinical research program: Secondary | ICD-10-CM

## 2022-01-03 LAB — HEPATIC FUNCTION PANEL
ALT: 13 U/L (ref 0–53)
AST: 26 U/L (ref 0–37)
Albumin: 4.1 g/dL (ref 3.5–5.2)
Alkaline Phosphatase: 60 U/L (ref 39–117)
Bilirubin, Direct: 0.1 mg/dL (ref 0.0–0.3)
Total Bilirubin: 0.5 mg/dL (ref 0.2–1.2)
Total Protein: 6.5 g/dL (ref 6.0–8.3)

## 2022-01-03 NOTE — Progress Notes (Signed)
OV 02/22/2016  Chief Complaint  Patient presents with   Pulmonary Consult    Pt referred by Dr.Byrum for ILD. Pt states he feels he is still recovering from the lung biopsy. Pt denies cough and CP/tightness.     2nd opinionand transfer of care for ILD/UIP - IPF from DR Byrum. Presents with his wife. They related to Dr. Stanford Breed cardiologist   66 year old male found to have crackles on physical exam and subsequently surgical lung biopsy 02/02/2016 showed UIP as read by pathologist in [place].  [employer] of chest physicians interstitial lung disease questionnaire  Symptoms: He does not cough and is not troubled by shortness of breath except with strenuous exercise. Onset is only "recently" Past medical history: Positive for chronic sinus drainage mild. Recently after surgical lung biopsy was being a few bone unintentional weight loss. Otherwise negative Personal exposure history: He smoked occasional cigarettes having grown up on her [place] farm starting at 44 including when he was age 24.  Family history: All the male members in his family who smoke developed COPD but also did not smoke to the pulmonary fibrosis especially his father and his paternal uncles. His dad was diagnosed with pulmonary fibrosis at age of 41 in 05/21/01 and died from progressive hypoxemic respiratory failure after what sounds like an IPF flare in 05/21/05.  Home exposure history: He is lived in the same house since 05/22/1987. The home does not have a humidifier or sound or hot tub or Jacuzzi. He is not exposed to any birds. 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 Approximately 30 years of work ending 4 years ago used to cut through dry walls and was exposed to concrete dust and drywall dust and possibly asbestos. HE is to cut  through insulation safe. 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 Did not work in a minor quarry a Teacher, music on Agricultural engineer. Denies any exposure to birds of feathers  Autoimmune history: Is negative.  History of exposure to pulmonary toxic drugs: Negative   Lab work - April 2017 and November 2017 extensive autoimmune and vascular this panel is negative  - High resolution CT scan of the chest August 2017: UIP pattern that is classic - personally visualized. There is also 5 mm right lower lobe lung nodule and a 5.9 cm left-sided lower bulla [stable compared to April 2017]   Severity - Pulmonary function test 08/02/2015: Isolated reduction in diffusion capacity 17.33/61%. Otherwise FVC 4 L/93% and normal. Total lung capacity 5.9 L/91% and normal - Walking desaturation test 185 feet 3 laps on room air in the office 02/22/2016: Did not desaturate but gets symptomatic   Emotional state: He is extremely worried about the diagnosis of IPF. This is particularly so because he has a strong family history and his father died within 94 years usually was diagnosed much order at age 105. Patient is worried that he got diagnosed with the same problem and much younger age of 3 and feels that he only will have a few years to live. Multiple times during the visit he was teary-eyed   OV 07/21/2016  Chief Complaint  Patient presents with   Follow-up    Pt denies  change in breathing since last OV. Pt denies cough and SOB and CP/tightness.      Follow-up idiopathic pulmonary fibrosis familial variety  He presents with his wife. Overall he is stable. 4 routine activities of daily living and mowing the yard he does not have any shortness of breath or cough. He did find the mountains recently been quite dyspneic. This is worse than June 2017 with for the diagnosis of IPF. Overall he is tolerating the Ofev just fine  except occasionally he has some GI discomfort.He is questions about research trials, exercise, pulmonary fibrosis foundation and family genetics.he is quite active and diffuse pulmonary rehabilitation is easy for him. He is willing to go to [facility] and work on aerobic strengthening and muscle conditioning  Notice that she's not had pulmonary function testing since May 2017    has a past medical history of Dyspnea; Hyperlipemia; and Pneumonia.   reports that he has never smoked. He has never used smokeless tobacco.  OV 10/25/2016  Chief Complaint  Patient presents with   Follow-up    Pt here after PFT. Pt states his breathing is unchanged. Pt c/o PND and dry cough. Pt denies f/c/s and CP/tightenss.    Follow-up mild idiopathic pulmonary fibrosis he's on Ofev since  NOv 2017  Here with his wife. Overall he is stable. His pulmonary function tests and CT scan of the chest shows continued stability for 1 year. Symptom-wise also he is stable. His dyspnea when doing mountains. He is kayaking and biking and is able to outpace his wife. During this time he does not check his pulse oximetry. He does not have one. I've advised that he can get one. He is asking about familial issues, research and also prognosis we went over all this. Of note CT scan of the chest shows also he has concomitant emphysema. He prefers to avoid medications but he is interested in taking an inhaler if it means improving his dyspnea. He is active with pulmonary fibrosis foundation patient support groupo. Of note he reports easy bruising in his forearm when he bumps into something. His wife thinks it's been there all along and is unchanged. He is wondering if that this is related to Ofev and asa. We did discuss about the bleeding risk of Ofev and the need to hold this before any procedure. Overall he tolerates Ofev just fine except for occasional diarrhea once a month. This is associated with fiber food    OV  02/26/2017  Chief  Complaint  Patient presents with   Follow-up    PFT done today. Pt still taking OFEV and doing good on it. Denies any complaints with cough, SOB, or CP.    Follow-up idiopathic pulmonary fibrosis on Nintendanib since November 2017  This time his wife is not here with him.  He has now been on nintedanib for a year.  Overall he is tolerating it fine except for mild occasional diarrhea.  He continues to be very active working out in the yard and doing daily walks with a few miles.  He notices only mild dyspnea with this relieved by rest.  He does not feel his health has declined in the last 1 year.  He does have concomitant emphysema on the CT scan but not apparent on the PFTs.  I asked him to try Spiriva last time but it did not help.  He is dissatisfied with occasional albuterol use.  He did have pulmonary function test today which is documented below.  The FVC is stable but there is a change in the DLCO but he is not feeling it.  He is on IPF registry by [facility];  he is due for a study visit today.  He is interested in further studies especially [place].  We discussed this in detail brief.  He has had genetics testing he got the result.  He is worried insurance will deny this.  He is participating in the pulmonary fibrosis foundation patient support group.  He is asking details about the March 17, 2017 [place]   OV 05/22/2017  Chief Complaint  Patient presents with   Follow-up    PFT done today.  Pt stated he had the flu 05/05/17 and was not able to eat on 05/06/17 so he missed two OFEV doses. States he has had diarrhea a couple times since then but is now doing fine with help from immodium.  States his breathing has been doing good.      Follow-up idiopathic pulmonary fibrosis on Nintendanib since November 1116   66 year old male with idiopathic pulmonary fibrosis.  He is on stable dose of nintedanib.  He tells me earlier this month he had stomach flu and that he suddenly had subjective  sense of fever associated with diarrhea and vomiting.  This resolved in 2 days.  Since then he is lost a couple of pounds of weight.  He is worried about tolerating the nintedanib.  In addition primary function test shows a 3% decline in FVC and 2 years and a 12% decline in DLCO in 2 years.  He is now wondering if the drug is effective.  Overall other than the recent illness and occasional mild diarrhea he is tolerated off of just fine.  But he is now worried about the tolerance.  He has been advised by [facility] nursing support team about taking protein with nintedanib to offset side effects and he has questions about this.   OV 08/14/2017  Chief Complaint  Patient presents with   Follow-up    PFT done today. Pt states he has been doing good since last visit and denies any complaints.     Follow-up idiopathic pulmonary fibrosis surgical lung biopsy diagnosis February 02, 2016 on Nintendanib since November 2017  Mr. Gnau continues to do well.  At the time of last visit there was concern about weight loss and also progression in lung function.  However at that time he had a respiratory infection.  Today he comes back and tells me that he has gained 1 pound of weight.  His respiratory symptoms have resolved he is back to being at a stable mild exertional dyspnea.  He is extremely active.  Concordant with his symptom improvement his FVC has improved and his DLCO is stable.  Overall his pulmonary function test is stable since November 2018 at least with a DLCO although the DLCO has progressed since 2017.  His FVC has been stable all along since May 2017.  He is not interested in the [place] study because of the oral pill and can make his diarrhea potentially worse if he gets randomized to the drug.  He is interested in an upcoming inhaler trial of IPF.  He is active in the support group and is asking about the next meeting.  He is not on oxygen.        Ct Chest High Resolution  Result Date:  10/25/2016 CLINICAL DATA:  Interstitial lung disease, former smoker, cough. Lung biopsy November 2017. EXAM: CT CHEST  WITHOUT CONTRAST TECHNIQUE: Multidetector CT imaging of the chest was performed following the standard protocol without intravenous contrast. High resolution imaging of the lungs, as well as inspiratory and expiratory imaging, was performed. COMPARISON:  11/23/2015 and 07/08/2015. FINDINGS: Cardiovascular: Vascular structures are unremarkable. Heart size normal. No pericardial effusion. Mediastinum/Nodes: Mediastinal lymph nodes are not enlarged by CT size criteria. Hilar regions are difficult to evaluate without IV contrast. No axillary adenopathy. Esophagus is grossly unremarkable. Lungs/Pleura: Centrilobular and paraseptal emphysema with a large bullous lesion in the left lower lobe. Post biopsy changes in the right upper, right middle and right lower lobes. Superimposed basilar predominant subpleural reticulation, ground-glass and traction bronchiectasis/bronchiolectasis, likely stable from baseline examination of 07/08/2015 when differences in slice collimation are considered. There may be scattered honeycombing. Mild subpleural nodularity along the right major fissure is unchanged from 07/08/2015, indicative of subpleural lymph nodes. No pleural fluid. Airway is unremarkable. Upper Abdomen: Visualized portions of the liver and gallbladder are unremarkable. Mild nodular thickening of both adrenal glands. Visualized portion of the right kidney is unremarkable. 2.5 cm low-attenuation lesion in the left kidney is likely a cyst. Visualized portions of the spleen, pancreas, stomach and bowel are grossly unremarkable. No upper abdominal adenopathy. Musculoskeletal: No worrisome lytic or sclerotic lesions. Degenerative changes in the spine. IMPRESSION: 1. Pulmonary parenchymal pattern of fibrosis is likely stable from 07/08/2015 when differences in slice collimation are considered. Findings are in  keeping with the pathologic diagnosis of usual interstitial pneumonitis. 2.  Emphysema (ICD10-J43.9). Electronically Signed   By: Lorin Picket M.D.   On: 10/25/2016 08:23   OV 02/26/2018  Subjective:  Patient ID: Shaun [name], male , DOB: Feb 17, 1956 , age 57 y.o. , MRN: [id number] , ADDRESS: [place] [address]   02/26/2018 -   Chief Complaint  Patient presents with   Follow-up    breathing well     HPI Shaun [name] 66 y.o. -follow-up familial IPF.  Last seen in May 2019.  After that in July 2019 he is screened for the electric face to inhaler study but he screen failed because he is standard of care CT scan of the chest from July 2018 was not per sponsored protocol or 2018 ATS protocol.  At this point in time he continues on nintedanib.  He is not taking proton pump inhibitor and wants to know if that is okay.  He feels he is stable.  There are no new issues.  The main thing is with nintedanib once a month he will have sporadic diarrhea.  He says he has been unable to control this but from a respiratory standpoint he feels he is stable.  There are no new medical issues.  He is up-to-date with his vaccines although immunization record review shows that he could benefit from Prevnar 13 which she has not had.  He continues to be actively engaged in the patient support group.   ROS - per HPI    OV 04/29/2018  Subjective:  Patient ID: Shaun [name], male , DOB: 12-01-1955 , age 68 y.o. , MRN: [id number] , ADDRESS: [place] [id number]   04/29/2018 -   Chief Complaint  Patient presents with   Follow-up    PFT performed today.  Pt states he is about the same since last visit.      HPI LATRON RIBAS 66 y.o. -returns for IPF follow-up.  He is on nintedanib.  He reports continued stability.  He still has  ongoing occasional issue with diarrhea that happened suddenly every few weeks.  It is so random.  It bothers him and does affect his  quality of life.  He takes Imodium in response.  We did discuss about taking Imodium proactively once a week.  He is now to give this a try.  He continues to be interested in pulmonary reserve study particularly face to inhaler study sponsored by [employer].  Our research department is getting ready and probably should be able to screen him in the next few to several weeks.  His last pulmonary function test was today and shows continued stability.  Last liver function test was November 2019 and was normal.  His walk test today is baseline     ROS   OV 11/29/2018  Subjective:  Patient ID: Shaun [name], male , DOB: Aug 21, 1955 , age 42 y.o. , MRN: [id number] , ADDRESS: [place] [id number]   11/29/2018 -   Chief Complaint  Patient presents with   IPF (idiopathic pulmonary fibrosis)    No issues, states medication is working for him.   Follow-up idiopathic pulmonary fibrosis on Nintendanib since November 2017  HPI YUNIOR JAIN 66 y.o. -presents for follow-up of IPF.  Last visit was early in 2020.  And then the pandemic care.  He try to get down an inhaler trial of phase 2 for IPF - [place] but he screen failed because of technical issues with the spirometry.  He says overall he is doing well.  He is just following low risk activities.  He has a christening coming up for his grandson.  This will be outdoors in [place].  A total of 12 people will attend.  Everybody will be Masked.  He continues taking nintedanib and does not have any issues tolerating it.  Last liver function test was over 6 months ago.  He needs another one now.  He is also asking for a flu shot.  His symptom score and walking desaturation test showed continued stability.  OV 07/15/2019  Subjective:  Patient ID: Shaun [name], male , DOB: May 07, 1955 , age 56 y.o. , MRN: [id number] , ADDRESS: [place] [id number]   07/15/2019 -   Chief Complaint  Patient presents with    Follow-up    Follow-up idiopathic pulmonary fibrosis on Nintendanib since November 2017   HPI VIMAL DEREGO 66 y.o. -presents for IPF follow-up.  Last seen August 2020.  This visit was supposed to have a pulmonary function test but this was not scheduled.  Patient overall feels stable.  Dyspnea is stable and minimal.  He walks on a regular basis 45 minutes.  He tolerates nintedanib but has mild symptoms.  He is asking about going to a lower dose but tells me that if the drug has been beneficial he says he can manage with the current symptoms.  He says he has had the COVID-19 vaccine at this point.  Last second dose was 1 week ago.  He is in the IPF pro registry.  He is not on any interventional trial.  He screen fell for an inhaler trial but that has been withdrawn because of safety reasons.  He was never a participant in the trial.  He is interested in other trials.  He reported that he is going to be 66 years old and will become a Medicare recipient in the next several months.  He is going to look for insurance options.  Walking desaturation  test was stable.  He is okay waiting till the next visit for spirometry.     OV 01/14/2020   Subjective:  Patient ID: Shaun [name], male , DOB: 1955-11-22, age 48 y.o. years. , MRN: [id number],  ADDRESS: [place] [address] PCP  Mayra Neer, MD Providers : Treatment Team:  Attending Provider: Brand Males, MD Patient Care Team: Mayra Neer, MD as PCP - General (Family Medicine)    Chief Complaint  Patient presents with   Follow-up    PFT performed 10/12.  Pt states he has been doing okay since last visit. States that he is still having problems with diarrhea which he believes has become worse.    IPF diagnosed in November 2017.   HPI CURT OATIS 67 y.o. -returns for follow-up of his pulmonary fibrosis IPF.  He is coming up for years since diagnosis.  Overall he reports that the stability in his  symptoms see below.  He tells me that when he goes for a 35/45-minute walk he has difficulty with inclines.  He did say this is overall stable but may be compared to a year or 2 ago it is slightly worse.  Review of his pulmonary function test shows compared to 2017 there are some decline in both FVC and DLCO and compared to early 2020 there is slight decline in DLCO although compared to most recent numbers his DLCO is better.  His weight is stable.  Overall he is tolerating the nintedanib fine but he is having diarrhea.  The diarrhea is uncomfortable but nevertheless is only 2 episodes a week.  Last visit I advised him to take Imodium once a week on a scheduled basis but he says he prefers not to take medicine and he takes it as needed.  If this is then restarting to him using Imodium twice a week.  We discussed and is willing to try once a week schedule.  The other alternative he wanted to discuss was reducing the nintedanib to 100 mg twice daily.  I cautioned against this given the fact his lungs have had only a slight progression nevertheless there is progression and with the lower dose of nintedanib may be it is less effective.  Therefore is going to try the Imodium route.  He has upcoming colonoscopy because of his diarrhea.  We advised the to stop the nintedanib 2 days before and 1-2 days after the procedure.  Similarly for dental procedure he will stop the nintedanib 2 days before and resume 1 or 2 days after the procedure.   He is interested in research protocol.  We discussed several studies.  He is not interested in the Phase 2 [place] study.  We discussed promedior IV infusion study.  He wants to think about this.  We will send in the consent form.  This upcoming study with an inhaler that is already preapproved in the market for pulmonary hypertension.  Is called treprostinil.  If we get the study then he might be interested in this.   OV 12/22/2020  Subjective:  Patient ID: Shaun [name],  male , DOB: 06/26/1955 , age 21 y.o. , MRN: [id number] , ADDRESS: [place] [id number] PCP Mayra Neer, MD Patient Care Team: Mayra Neer, MD as PCP - General (Family Medicine)  This Provider for this visit: Treatment Team:  Attending Provider: Brand Males, MD    12/22/2020 -   Chief Complaint  Patient presents with   Follow-up  PFT performed today. Pt states he is about the same since last visit. Currently has some problems with sinus drainage.    IPF diagnosed in November 2017.  Family of pulmonary fibrosis - last CT March 2020 - IPF-Pro registre HPI Shaun [name] 66 y.o. -returns for follow-up.  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  Overall he is doing stable.  He is adjusted his diet and his diarrhea with nintedanib is under much better control.  His weight is stable.  His symptom scores are stable.  Side effect from diarrhea improved.  He had a 6-minute walk test and this was normal.  He had liver function test today and it is normal.  He had questions about his prognosis.  I explained that given stability of ILD that more than likely is going to be stable in the next 1 year and beyond that it is difficult to predict.  This is based on Calculator.  We looked at his trend line with the FVC and DLCO.  His DLCO this is improved compared to last year.  Therefore overall it appears that he had a decline between 2017 and 2018 but since late 2018 he has been stable till the current time September 2022.  He is always been interested in clinical trials as a care option.  He was given the option of inhaler treprostinil study called [facility].Marland Kitchen  He did not want to do oral drug study.  He has reviewed the consent form he is not interested in this because he has to take the inhaler 4 times each day and more than 9 times each time.  We discussed IV recombinant Pentraxin with goodphase 2 to safety and efficacy data .   I shorten the publication images of the data on this.  I have given him that.  Also give him a detailed consent form forhis for review.  He will read and reflect on this.          07/25/2021 Follow up : IPF  Patient presents for a 32-monthfollow-up.  Patient has underlying IPF.  Patient says overall breathing is doing about the same.  He gets short of breath with heavy activities but tries to remain active every day.  Typically walks about 30 to 40 minutes every day.  Recently was able to go hiking but did notice that when he was going up an incline/he will he did get some shortness of breath and had to rest.  Patient says his weight has been stable.  Patient remains on Ofev.  Patient says his chronic diarrhea has been managed with Imodium.  Typically is only have to take it about every 3 days or so.  And only takes a half a tablet.  He feels that this has become under better control. Patient says he does check his oxygen levels at home.  Has not noticed any lower oxygen levels. PFTs done on July 12, 2021 are essentially stable.  FEV1 at 108%, ratio 80, FVC 100%, DLCO 62%.  This is very similar to his lung function testing in March 2022. Patient is talking to [facility] research team  and has been referred for  the ILD pro registry.     OV 01/03/2022  Subjective:  Patient ID: STamela [name] male , DOB: [birth year], age 66y.o. , MRN: [id number], [address]: [id number] SMayra Neer MD Patient Care Team: SMayra Neer MD as PCP -  General (Family Medicine)  This Provider for this visit: Treatment Team:  Attending Provider: Brand Males, MD    01/03/2022 -   Chief Complaint  Patient presents with   Follow-up    Review PFT's. Breathing is slightly worse. He gets winded walking up an incline. He is walking approx 2 miles per day. He would like a flu vaccine today.     IPF diagnosed in November 2017.  Family of pulmonary fibrosis - last CT  March 2020 - IPF-Pro registre  HPI Shaun [name] 66 y.o. -returns for follow-up.  I personally not seen him in a year.  From a respiratory symptom standpoint he is stable as can be seen below.  His weight is also stable.  He marked mild for diarrhea but this is with Imodium.  He says he is worried about taking Imodium every other day.  His diet is actually more than what is listed.  He says this week and he had to go to [place] to see his grandchildren and he had to stop.  He says the diarrhea is unpredictable.  Therefore he is asking if he can go to the low-dose of nintedanib.  We discussed 4  options including taking a holiday and then going to the low-dose versus taking a holiday from nintedanib and going to the full dose versus itching to pirfenidone versus enrolling in a clinical trial involving due to deutrified pirfenidone.  We weighed all the options.  I personally recommended taking a holiday and then rechallenging himself with nintedanib.  We took a shared decision making to go with a low-dose.  He is aware the low-dose might not be as efficacious but given his relative stability over time he is willing to do that.  He is simply does not want to do the high-dose because of the diarrhea.  Of note he is in the IPF-Pro registry and he will participate in that today.  He will have a high-dose flu shot today as well.  Perfect that all     SYMPTOM SCALE - ILD 11/29/2018 140# 07/15/2019  01/14/2020 138# 12/22/2020 138# 01/03/2022 142#  O2 use Room air   ra ra  Shortness of Breath 0 -> 5 scale with 5 being worst (score 6 If unable to do)      At rest 0 0 0 0 0  Simple tasks - showers, clothes change, eating, shaving 0 0 0 0 0  Household (dishes, doing bed, laundry) 0 0  0 0  Shopping 0 0 0 0 0  Walking keeping up with others of same age 51 0 00 0 0  Walking up Stairs _0 Total (40 - 48) Dyspnea Score _1 How bad is your cough? 0 0 1 0 0  How bad is your fatigue 0 _2 nausea  1 1 0 0  vomit  0 0 0 0  diarhea  _3 anxity  _4 depresion  1 1 0 1     Simple office walk - 250 feet x 3 laps 02/26/2018  04/29/2018  11/29/2018  07/15/2019  01/03/2022   O2 used Room air Room air Room air ra   Number laps completed _5 Comments about pace Good brisk      Resting Pulse Ox/HR 100% and 74/min 100% and 86/min     100% and 76/min 100%and 79/min  Final Pulse Ox/HR 96% and 96/min 97% and 93/min 96% and 99/min 96% and 111/min   Desaturated </= 88% no no no no   Desaturated <= 3% points yes Yes, 3 Yes, 4 Yes, 4 points   Got Tachycardic >/= 90/min yes yes yes yes   Symptoms at end of test No dyspnea No dyspnea  asympt   Miscellaneous comments x x            PFT     Latest Ref Rng & Units 12/27/2021    9:48 AM 07/12/2021   11:46 AM 12/22/2020    2:13 PM 06/03/2020   11:27 AM 01/13/2020    8:50 AM 10/14/2019    8:49 AM 07/04/2018    8:59 AM  PFT Results  FVC-Pre L 3.83  3.94  4.03  3.95  3.95  4.07  4.12   FVC-Predicted Pre % 97  100  101  99  99  102  102   Pre FEV1/FVC % % 87  80  82  82  90  88  88   FEV1-Pre L 3.35  3.16  3.30  3.22  3.55  3.57  3.61   FEV1-Predicted Pre % 115  108  112  109  119  119  119   DLCO uncorrected ml/min/mmHg 12.42  14.62  14.65  15.03  13.56  12.16  14.04   DLCO UNC% % 52  62  62  63  57  51  58   DLCO corrected ml/min/mmHg 12.42  14.62  14.65  15.03  13.56  12.16  13.34   DLCO COR %Predicted % 52  62  62  63  57  51  55   DLVA Predicted % 61  73  74  66  65  61  76       Results for DAKODA, LAVENTURE (MRN [id number]) as of 11/29/2018 09:56  Ref. Range 08/02/2015 10:41 10/25/2016 08:44 02/26/2017 09:06 05/22/2017 08:59 08/14/2017 12:41 10/19/2017 12:23 04/29/2018 10:47 05/21/2018 10:11 07/04/2018 08:59  FVC-Pre Latest Units: L 4.00 4.16 4.04 3.91 4.11  4.16  4.12  FVC-%Pred-Pre Latest Units: % 93 98 95 92 97  98  102  Results for IKE, MARAGH (MRN [id number]) as of 11/29/2018 09:56  Ref. Range 08/02/2015  10:41 10/25/2016 08:44 02/26/2017 09:06 05/22/2017 08:59 08/14/2017 12:41 10/19/2017 12:23 04/29/2018 10:47 05/21/2018 10:11 07/04/2018 08:59  DLCO unc Latest Units: ml/min/mmHg 17.33 17.25 14.23 15.14 14.42 13.93 14.84 15.39 14.04  DLCO unc % pred Latest Units: % 61 60 50 53 50 49 52 64 58     has a past medical history of Dyspnea, Family history of colon cancer, Family history of prostate cancer, Family history of pulmonary fibrosis, Hyperlipemia, and Pneumonia.   reports that he has never smoked. He has never used smokeless tobacco.  Past Surgical History:  Procedure Laterality Date   COLONOSCOPY WITH PROPOFOL N/A 09/21/2014   Procedure: COLONOSCOPY WITH PROPOFOL;  Surgeon: Garlan Fair, MD;  Location: [facility];  Service: Endoscopy;  Laterality: N/A;   FOREARM SURGERY     excision of birth mark"child"   HERNIA REPAIR     RIH-open   KNEE ARTHROTOMY Left    bursa excision   LUNG BIOPSY Right 02/02/2016   Procedure: LUNG BIOPSY;  Surgeon: [name], MD;  Location: [facility];  Service: Thoracic;  Laterality: Right;   NASAL SEPTUM SURGERY     VIDEO ASSISTED THORACOSCOPY Right 02/02/2016   Procedure: VIDEO ASSISTED THORACOSCOPY;  Surgeon: [name], MD;  Location: [facility] OR;  Service: Thoracic;  Laterality: Right;   WRIST GANGLION EXCISION Left     Allergies  Allergen Reactions   Penicillins Other (See Comments)    UNSPECIFIED REACTION OF CHILDHOOD Has patient had a PCN reaction causing immediate rash, facial/tongue/throat swelling, SOB or lightheadedness with hypotension:unsure Has patient had a PCN reaction causing severe rash involving mucus membranes or skin necrosis:unsure Has patient had a PCN reaction that required hospitalization:No Has patient had a PCN reaction occurring within the last 10 years:No If all of the above answers are "NO", then may proceed with Cephalosporin use.     Immunization History  Administered Date(s) Administered   Fluad Quad(high Dose  65+) 12/02/2020, 01/03/2022   Influenza,inj,Quad PF,6+ Mos 12/09/2016, 11/26/2017, 11/29/2018, 12/31/2019   Influenza-Unspecified 11/02/2015, 12/09/2016   Moderna SARS-COV2 Booster Vaccination 01/31/2020   Moderna Sars-Covid-2 Vaccination 06/05/2019, 07/08/2019, 01/31/2020, 07/05/2020   Pneumococcal Conjugate-13 02/26/2018   Pneumococcal Polysaccharide-23 02/05/2016   Zoster Recombinat (Shingrix) 12/09/2016, 02/10/2017   Zoster, Live 11/02/2015    Family History  Problem Relation Age of Onset   Lung cancer Mother    Colon cancer Mother 50   Pulmonary fibrosis Father        dx in his 45s   Prostate cancer Brother 24   COPD Paternal Grandfather    Other Paternal Uncle        possible lung dx; died around age 16   Other Cousin        possible lung dx, died in her 64s; paternal first cousin   Healthy Son      Current Outpatient Medications:    albuterol (PROVENTIL HFA;VENTOLIN HFA) 108 (90 Base) MCG/ACT inhaler, Inhale 2 puffs into the lungs every 6 (six) hours as needed for wheezing or shortness of breath., Disp: 1 Inhaler, Rfl: 5   aspirin EC 81 MG tablet, Take 81 mg by mouth daily., Disp: , Rfl:    Chlorpheniramine-Pseudoeph 4-60 MG TABS, Take 1 tablet by mouth 2 (two) times daily as needed (for allergies.)., Disp: , Rfl:    Multiple Vitamin (MULTIVITAMIN WITH MINERALS) TABS tablet, Take 1 tablet by mouth daily., Disp: , Rfl:    Nintedanib (OFEV) 150 MG CAPS, Take 1 capsule (150 mg total) by mouth 2 (two) times daily with a meal. Fatima Sanger information in notes., Disp: 180 capsule, Rfl: 3   simvastatin (ZOCOR) 10 MG tablet, Take 10 mg by mouth daily at 6 PM. , Disp: , Rfl: 0      Objective:   Vitals:   01/03/22 0902  BP: 128/68  Pulse: 65  Temp: 98 F (36.7 C)  TempSrc: Oral  SpO2: 96%  Weight: 142 lb (64.4 kg)  Height: 5' 7" (1.702 m)    Estimated body mass index is 22.24 kg/m as calculated from the following:   Height as of this encounter: 5' 7" (1.702 m).   Weight  as of this encounter: 142 lb (64.4 kg).  _0 @  Filed Weights   01/03/22 0902  Weight: 142 lb (64.4 kg)     Physical Exam    General: No distress. Looks well Neuro: Alert and Oriented x 3. GCS 15. Speech normal Psych: Pleasant Resp:  Barrel Chest - no.  Wheeze - no, Crackles - yes base, No overt respiratory distress CVS: Normal heart sounds. Murmurs - no Ext: Stigmata of Connective Tissue Disease - no HEENT: Normal upper airway. PEERL +. No post nasal drip  Assessment:       ICD-10-CM   1. IPF (idiopathic pulmonary fibrosis) ([place])  J84.112     2. 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 Therapeutic drug monitoring  Z51.81     4. Diarrhea due to drug  K52.1     5. Vaccine counseling  Z71.85     6. Flu vaccine need  Z23     7. Need for immunization against influenza  Z23 Flu Vaccine QUAD High Dose(Fluad)         Plan:     Patient Instructions     ICD-10-CM   1. IPF (idiopathic pulmonary fibrosis) ([place])  J84.112     2. Familial idiopathic pulmonary fibrosis ([place])  J84.112     3. Therapeutic drug monitoring  Z51.81     4. Diarrhea due to drug  K52.1     5. Vaccine counseling  Z71.85     6. Flu vaccine need  Z23       IPF is very slowly progressive over some years  Significant diarrhea with nintedanib and desire to go to low-dose  Last CT scan of the chest March 2020  Plan  - Give nintedanib holiday for 2 weeks and then start at 100 mg once daily for 1 week and then go to 100 mg twice daily  -CMA to inform pharmacist about the dose change -Check liver function test today -High-dose flu shot today -RSV vaccine and COVID mRNA later on your own -Do high-resolution CT chest supine and prone in approximately 8-10 weeks - Do IPF-PRO registry visit today 01/03/2022  - meet [facility]     - Followup -2-3 months - 30 min visit; but after high-resolution CT ches -- walk and ILD symptoms score at followup -  keep up research visit for registry  High complex medical condition requiring high risk prescription with intensive therapeutic monitoring requirement    SIGNATURE    Dr. Brand Males, M.D., F.C.C.P,  Pulmonary and Critical Care Medicine Staff Physician, [place] Director - Interstitial Lung Disease  Program  Pulmonary [place] at [place], [place], [address]  Pager: [phone], If no answer or between  15:00h - 7:00h: call [phone] Telephone: [phone]  9:41 AM 01/03/2022

## 2022-01-03 NOTE — Telephone Encounter (Signed)
Per Dr Chase Caller-  Plan  - Give nintedanib holiday for 2 weeks and then start at 100 mg once daily for 1 week and then go to 100 mg twice daily

## 2022-01-03 NOTE — Patient Instructions (Addendum)
ICD-10-CM   1. IPF (idiopathic pulmonary fibrosis) ([place])  J84.112     2. 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 Therapeutic drug monitoring  Z51.81     4. Diarrhea due to drug  K52.1     5. Vaccine counseling  Z71.85     6. Flu vaccine need  Z23       IPF is very slowly progressive over some years  Significant diarrhea with nintedanib and desire to go to low-dose  Last CT scan of the chest March 2020  Plan  - Give nintedanib holiday for 2 weeks and then start at 100 mg once daily for 1 week and then go to 100 mg twice daily  -CMA to inform pharmacist about the dose change -Check liver function test today -High-dose flu shot today -RSV vaccine and COVID mRNA later on your own -Do high-resolution CT chest supine and prone in approximately 8-10 weeks - Do IPF-PRO registry visit today 01/03/2022  - meet [facility]     - Followup -2-3 months - 30 min visit; but after high-resolution CT ches -- walk and ILD symptoms score at followup - keep up research visit for registry

## 2022-01-04 MED ORDER — OFEV 100 MG PO CAPS: 100.0000 mg | ORAL_CAPSULE | Freq: Two times a day (BID) | ORAL | 4 refills | Status: DC

## 2022-01-04 MED ORDER — OFEV 100 MG PO CAPS: ORAL_CAPSULE | ORAL | 0 refills | Status: DC

## 2022-01-04 NOTE — Telephone Encounter (Signed)
Rx for Ofev 167m daily x 7 days then 1031mtwice daily thereafter.  I called patient regarding rx for Ofev and dose change. Reviewed dosing as above. He will reach out to [facility] and set up shipment  DeKnox SalivaPharmD, MPH, BCPS, CPP Clinical Pharmacist (Rheumatology and Pulmonology)

## 2022-01-23 ENCOUNTER — Telehealth: Payer: Self-pay | Admitting: Internal Medicine

## 2022-01-23 NOTE — Telephone Encounter (Signed)
     For radiology addendum:   - please call radiologist assistant line [phone] and specify Shaun [name] , Jul 03, 1955 and [phone] - mention imaging type HRCT and date 06/10/2018 - request addendum for purpose of Is extent of emphysema > extent of fibrosis on visual estimate?   Please send phone message back when done  Thanks    SIGNATURE    Dr. Brand Males, M.D., F.C.C.P,  Pulmonary and Critical Care Medicine Staff Physician, [place] Director - Interstitial Lung Disease  Program  Pulmonary [place] at [place], [place], [address]  Pager: [phone], If no answer  OR between  19:00-7:00h: page [phone] Telephone (clinical office): [phone] Telephone (research): [phone]  1:16 PM 01/23/2022

## 2022-01-23 NOTE — Telephone Encounter (Signed)
Called [facility] and spoke with Malachy Mood letting her know the addendum that MR wanted done on pt's HRCT. She stated she would send it to radiologist for review. Routing back to MR.

## 2022-02-03 NOTE — Research (Signed)
IPF-PRO Registry  Purpose: To collect data and biological samples that will support future research studies.  The goal of the registry is to research the current approaches to diagnosis, treatment, and progression of IPF. In addition, the registry will analyze participant characteristics, assess quality of life, describe participants interactions with the health care system, determine IPF treatment practices across multiple institutions, and utilize biological samples to identify disease biomarkers.    Shaun [name] 03-Jan-2022

## 2022-03-13 ENCOUNTER — Ambulatory Visit (HOSPITAL_BASED_OUTPATIENT_CLINIC_OR_DEPARTMENT_OTHER)
Admission: RE | Admit: 2022-03-13 | Discharge: 2022-03-13 | Disposition: A | Discharge status: Home / Self Care | Payer: Medicare Other | Source: Ambulatory Visit | Attending: Internal Medicine | Admitting: Internal Medicine

## 2022-03-13 ENCOUNTER — Encounter (HOSPITAL_BASED_OUTPATIENT_CLINIC_OR_DEPARTMENT_OTHER): Payer: Self-pay

## 2022-03-13 DIAGNOSIS — J84112 Idiopathic pulmonary fibrosis: Secondary | ICD-10-CM | POA: Insufficient documentation

## 2022-03-21 ENCOUNTER — Ambulatory Visit (INDEPENDENT_AMBULATORY_CARE_PROVIDER_SITE_OTHER): Payer: Medicare Other | Admitting: Internal Medicine

## 2022-03-21 ENCOUNTER — Encounter: Payer: Self-pay | Admitting: Internal Medicine

## 2022-03-21 VITALS — BP 120/70 | HR 75 | Ht 67.0 in | Wt 144.6 lb

## 2022-03-21 DIAGNOSIS — J019 Acute sinusitis, unspecified: Secondary | ICD-10-CM

## 2022-03-21 DIAGNOSIS — K521 Toxic gastroenteritis and colitis: Secondary | ICD-10-CM | POA: Diagnosis not present

## 2022-03-21 DIAGNOSIS — J439 Emphysema, unspecified: Secondary | ICD-10-CM | POA: Diagnosis not present

## 2022-03-21 DIAGNOSIS — J84112 Idiopathic pulmonary fibrosis: Secondary | ICD-10-CM | POA: Diagnosis not present

## 2022-03-21 DIAGNOSIS — Z5181 Encounter for therapeutic drug level monitoring: Secondary | ICD-10-CM | POA: Diagnosis not present

## 2022-03-21 MED ORDER — SPIRIVA RESPIMAT 2.5 MCG/ACT IN AERS: 2.0000 | INHALATION_SPRAY | Freq: Every day | RESPIRATORY_TRACT | 0 refills | Status: DC

## 2022-03-21 MED ORDER — PREDNISONE 10 MG PO TABS: ORAL_TABLET | ORAL | 0 refills | Status: AC

## 2022-03-21 NOTE — Progress Notes (Signed)
OV 02/22/2016  Chief Complaint  Patient presents with   Pulmonary Consult    Pt referred by Dr.Byrum for ILD. Pt states he feels he is still recovering from the lung biopsy. Pt denies cough and CP/tightness.     2nd opinionand transfer of care for ILD/UIP - IPF from DR Byrum. Presents with his wife. They related to Dr. Stanford Breed cardiologist   66 year old male found to have crackles on physical exam and subsequently surgical lung biopsy 02/02/2016 showed UIP as read by pathologist in [place].  [employer] of chest physicians interstitial lung disease questionnaire  Symptoms: He does not cough and is not troubled by shortness of breath except with strenuous exercise. Onset is only "recently" Past medical history: Positive for chronic sinus drainage mild. Recently after surgical lung biopsy was being a few bone unintentional weight loss. Otherwise negative Personal exposure history: He smoked occasional cigarettes having grown up on her [place] farm starting at 81 including when he was age 22.  Family history: All the male members in his family who smoke developed COPD but also did not smoke to the pulmonary fibrosis especially his father and his paternal uncles. His dad was diagnosed with pulmonary fibrosis at age of 48 in 05/18/01 and died from progressive hypoxemic respiratory failure after what sounds like an IPF flare in 2005-05-18.  Home exposure history: He is lived in the same house since 05/19/87. The home does not have a humidifier or sound or hot tub or Jacuzzi. He is not exposed to any birds. 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 Approximately 30 years of work ending 4 years ago used to cut through dry walls and was exposed to concrete dust and drywall dust and possibly asbestos. HE is to cut  through insulation safe. 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 Did not work in a minor quarry a Teacher, music on Agricultural engineer. Denies any exposure to birds of feathers  Autoimmune history: Is negative.  History of exposure to pulmonary toxic drugs: Negative   Lab work - April 2017 and November 2017 extensive autoimmune and vascular this panel is negative  - High resolution CT scan of the chest August 2017: UIP pattern that is classic - personally visualized. There is also 5 mm right lower lobe lung nodule and a 5.9 cm left-sided lower bulla [stable compared to April 2017]   Severity - Pulmonary function test 08/02/2015: Isolated reduction in diffusion capacity 17.33/61%. Otherwise FVC 4 L/93% and normal. Total lung capacity 5.9 L/91% and normal - Walking desaturation test 185 feet 3 laps on room air in the office 02/22/2016: Did not desaturate but gets symptomatic   Emotional state: He is extremely worried about the diagnosis of IPF. This is particularly so because he has a strong family history and his father died within 76 years usually was diagnosed much order at age 72. Patient is worried that he got diagnosed with the same problem and much younger age of 15 and feels that he only will have a few years to live. Multiple times during the visit he was teary-eyed   OV 07/21/2016  Chief Complaint  Patient presents with   Follow-up    Pt  denies change in breathing since last OV. Pt denies cough and SOB and CP/tightness.      Follow-up idiopathic pulmonary fibrosis familial variety  He presents with his wife. Overall he is stable. 4 routine activities of daily living and mowing the yard he does not have any shortness of breath or cough. He did find the mountains recently been quite dyspneic. This is worse than June 2017 with for the diagnosis of IPF. Overall he is tolerating the Ofev just fine  except occasionally he has some GI discomfort.He is questions about research trials, exercise, pulmonary fibrosis foundation and family genetics.he is quite active and diffuse pulmonary rehabilitation is easy for him. He is willing to go to [facility] and work on aerobic strengthening and muscle conditioning  Notice that she's not had pulmonary function testing since May 2017    has a past medical history of Dyspnea; Hyperlipemia; and Pneumonia.   reports that he has never smoked. He has never used smokeless tobacco.  OV 10/25/2016  Chief Complaint  Patient presents with   Follow-up    Pt here after PFT. Pt states his breathing is unchanged. Pt c/o PND and dry cough. Pt denies f/c/s and CP/tightenss.    Follow-up mild idiopathic pulmonary fibrosis he's on Ofev since  NOv 2017  Here with his wife. Overall he is stable. His pulmonary function tests and CT scan of the chest shows continued stability for 1 year. Symptom-wise also he is stable. His dyspnea when doing mountains. He is kayaking and biking and is able to outpace his wife. During this time he does not check his pulse oximetry. He does not have one. I've advised that he can get one. He is asking about familial issues, research and also prognosis we went over all this. Of note CT scan of the chest shows also he has concomitant emphysema. He prefers to avoid medications but he is interested in taking an inhaler if it means improving his dyspnea. He is active with pulmonary fibrosis foundation patient support groupo. Of note he reports easy bruising in his forearm when he bumps into something. His wife thinks it's been there all along and is unchanged. He is wondering if that this is related to Ofev and asa. We did discuss about the bleeding risk of Ofev and the need to hold this before any procedure. Overall he tolerates Ofev just fine except for occasional diarrhea once a month. This is associated with fiber food    OV  02/26/2017  Chief  Complaint  Patient presents with   Follow-up    PFT done today. Pt still taking OFEV and doing good on it. Denies any complaints with cough, SOB, or CP.    Follow-up idiopathic pulmonary fibrosis on Nintendanib since November 2017  This time his wife is not here with him.  He has now been on nintedanib for a year.  Overall he is tolerating it fine except for mild occasional diarrhea.  He continues to be very active working out in the yard and doing daily walks with a few miles.  He notices only mild dyspnea with this relieved by rest.  He does not feel his health has declined in the last 1 year.  He does have concomitant emphysema on the CT scan but not apparent on the PFTs.  I asked him to try Spiriva last time but it did not help.  He is dissatisfied with occasional albuterol use.  He did have pulmonary function test today which is documented below.  The FVC is stable but there is a change in the DLCO but he is not feeling it.  He is on IPF registry by [facility];  he is due for a study visit today.  He is interested in further studies especially [place].  We discussed this in detail brief.  He has had genetics testing he got the result.  He is worried insurance will deny this.  He is participating in the pulmonary fibrosis foundation patient support group.  He is asking details about the March 17, 2017 [place]   OV 05/22/2017  Chief Complaint  Patient presents with   Follow-up    PFT done today.  Pt stated he had the flu 05/05/17 and was not able to eat on 05/06/17 so he missed two OFEV doses. States he has had diarrhea a couple times since then but is now doing fine with help from immodium.  States his breathing has been doing good.      Follow-up idiopathic pulmonary fibrosis on Nintendanib since November 1116   66 year old male with idiopathic pulmonary fibrosis.  He is on stable dose of nintedanib.  He tells me earlier this month he had stomach flu and that he suddenly had subjective  sense of fever associated with diarrhea and vomiting.  This resolved in 2 days.  Since then he is lost a couple of pounds of weight.  He is worried about tolerating the nintedanib.  In addition primary function test shows a 3% decline in FVC and 2 years and a 12% decline in DLCO in 2 years.  He is now wondering if the drug is effective.  Overall other than the recent illness and occasional mild diarrhea he is tolerated off of just fine.  But he is now worried about the tolerance.  He has been advised by [facility] nursing support team about taking protein with nintedanib to offset side effects and he has questions about this.   OV 08/14/2017  Chief Complaint  Patient presents with   Follow-up    PFT done today. Pt states he has been doing good since last visit and denies any complaints.     Follow-up idiopathic pulmonary fibrosis surgical lung biopsy diagnosis February 02, 2016 on Nintendanib since November 2017  Mr. Gnau continues to do well.  At the time of last visit there was concern about weight loss and also progression in lung function.  However at that time he had a respiratory infection.  Today he comes back and tells me that he has gained 1 pound of weight.  His respiratory symptoms have resolved he is back to being at a stable mild exertional dyspnea.  He is extremely active.  Concordant with his symptom improvement his FVC has improved and his DLCO is stable.  Overall his pulmonary function test is stable since November 2018 at least with a DLCO although the DLCO has progressed since 2017.  His FVC has been stable all along since May 2017.  He is not interested in the [place] study because of the oral pill and can make his diarrhea potentially worse if he gets randomized to the drug.  He is interested in an upcoming inhaler trial of IPF.  He is active in the support group and is asking about the next meeting.  He is not on oxygen.        Ct Chest High Resolution  Result Date:  10/25/2016 CLINICAL DATA:  Interstitial lung disease, former smoker, cough. Lung biopsy November 2017. EXAM: CT CHEST  WITHOUT CONTRAST TECHNIQUE: Multidetector CT imaging of the chest was performed following the standard protocol without intravenous contrast. High resolution imaging of the lungs, as well as inspiratory and expiratory imaging, was performed. COMPARISON:  11/23/2015 and 07/08/2015. FINDINGS: Cardiovascular: Vascular structures are unremarkable. Heart size normal. No pericardial effusion. Mediastinum/Nodes: Mediastinal lymph nodes are not enlarged by CT size criteria. Hilar regions are difficult to evaluate without IV contrast. No axillary adenopathy. Esophagus is grossly unremarkable. Lungs/Pleura: Centrilobular and paraseptal emphysema with a large bullous lesion in the left lower lobe. Post biopsy changes in the right upper, right middle and right lower lobes. Superimposed basilar predominant subpleural reticulation, ground-glass and traction bronchiectasis/bronchiolectasis, likely stable from baseline examination of 07/08/2015 when differences in slice collimation are considered. There may be scattered honeycombing. Mild subpleural nodularity along the right major fissure is unchanged from 07/08/2015, indicative of subpleural lymph nodes. No pleural fluid. Airway is unremarkable. Upper Abdomen: Visualized portions of the liver and gallbladder are unremarkable. Mild nodular thickening of both adrenal glands. Visualized portion of the right kidney is unremarkable. 2.5 cm low-attenuation lesion in the left kidney is likely a cyst. Visualized portions of the spleen, pancreas, stomach and bowel are grossly unremarkable. No upper abdominal adenopathy. Musculoskeletal: No worrisome lytic or sclerotic lesions. Degenerative changes in the spine. IMPRESSION: 1. Pulmonary parenchymal pattern of fibrosis is likely stable from 07/08/2015 when differences in slice collimation are considered. Findings are in  keeping with the pathologic diagnosis of usual interstitial pneumonitis. 2.  Emphysema (ICD10-J43.9). Electronically Signed   By: Lorin Picket M.D.   On: 10/25/2016 08:23   OV 02/26/2018  Subjective:  Patient ID: Shaun [name], male , DOB: 10-Jan-1956 , age 81 y.o. , MRN: [id number] , ADDRESS: [place] [address]   02/26/2018 -   Chief Complaint  Patient presents with   Follow-up    breathing well     HPI Shaun [name] 66 y.o. -follow-up familial IPF.  Last seen in May 2019.  After that in July 2019 he is screened for the electric face to inhaler study but he screen failed because he is standard of care CT scan of the chest from July 2018 was not per sponsored protocol or 2018 ATS protocol.  At this point in time he continues on nintedanib.  He is not taking proton pump inhibitor and wants to know if that is okay.  He feels he is stable.  There are no new issues.  The main thing is with nintedanib once a month he will have sporadic diarrhea.  He says he has been unable to control this but from a respiratory standpoint he feels he is stable.  There are no new medical issues.  He is up-to-date with his vaccines although immunization record review shows that he could benefit from Prevnar 13 which she has not had.  He continues to be actively engaged in the patient support group.   ROS - per HPI    OV 04/29/2018  Subjective:  Patient ID: Shaun [name], male , DOB: [birth year] , age 28 y.o. , MRN: [id number] , ADDRESS: [place] [id number]   04/29/2018 -   Chief Complaint  Patient presents with   Follow-up    PFT performed today.  Pt states he is about the same since last visit.      HPI Shaun [name] 66 y.o. -returns for IPF follow-up.  He is on nintedanib.  He reports continued stability.  He still has  ongoing occasional issue with diarrhea that happened suddenly every few weeks.  It is so random.  It bothers him and does affect his  quality of life.  He takes Imodium in response.  We did discuss about taking Imodium proactively once a week.  He is now to give this a try.  He continues to be interested in pulmonary reserve study particularly face to inhaler study sponsored by [employer].  Our research department is getting ready and probably should be able to screen him in the next few to several weeks.  His last pulmonary function test was today and shows continued stability.  Last liver function test was November 2019 and was normal.  His walk test today is baseline     ROS   OV 11/29/2018  Subjective:  Patient ID: Shaun [name], male , DOB: Aug 21, 1955 , age 42 y.o. , MRN: [id number] , ADDRESS: [place] [address]   11/29/2018 -   Chief Complaint  Patient presents with   IPF (idiopathic pulmonary fibrosis)    No issues, states medication is working for him.   Follow-up idiopathic pulmonary fibrosis on Nintendanib since November 2017  HPI Shaun [name] 66 y.o. -presents for follow-up of IPF.  Last visit was early in 2020.  And then the pandemic care.  He try to get down an inhaler trial of phase 2 for IPF - [place] but he screen failed because of technical issues with the spirometry.  He says overall he is doing well.  He is just following low risk activities.  He has a christening coming up for his grandson.  This will be outdoors in [place].  A total of 12 people will attend.  Everybody will be Masked.  He continues taking nintedanib and does not have any issues tolerating it.  Last liver function test was over 6 months ago.  He needs another one now.  He is also asking for a flu shot.  His symptom score and walking desaturation test showed continued stability.  OV 07/15/2019  Subjective:  Patient ID: Shaun [name], male , DOB: May 07, 1955 , age 56 y.o. , MRN: [id number] , ADDRESS: [place] [id number]   07/15/2019 -   Chief Complaint  Patient presents with    Follow-up    Follow-up idiopathic pulmonary fibrosis on Nintendanib since November 2017   HPI Shaun [name] 66 y.o. -presents for IPF follow-up.  Last seen August 2020.  This visit was supposed to have a pulmonary function test but this was not scheduled.  Patient overall feels stable.  Dyspnea is stable and minimal.  He walks on a regular basis 45 minutes.  He tolerates nintedanib but has mild symptoms.  He is asking about going to a lower dose but tells me that if the drug has been beneficial he says he can manage with the current symptoms.  He says he has had the COVID-19 vaccine at this point.  Last second dose was 1 week ago.  He is in the IPF pro registry.  He is not on any interventional trial.  He screen fell for an inhaler trial but that has been withdrawn because of safety reasons.  He was never a participant in the trial.  He is interested in other trials.  He reported that he is going to be 66 years old and will become a Medicare recipient in the next several months.  He is going to look for insurance options.  Walking desaturation  test was stable.  He is okay waiting till the next visit for spirometry.     OV 01/14/2020   Subjective:  Patient ID: Shaun [name], male , DOB: 1955-11-22, age 48 y.o. years. , MRN: [id number],  ADDRESS: [place] [address] PCP  Mayra Neer, MD Providers : Treatment Team:  Attending Provider: Brand Males, MD Patient Care Team: Mayra Neer, MD as PCP - General (Family Medicine)    Chief Complaint  Patient presents with   Follow-up    PFT performed 10/12.  Pt states he has been doing okay since last visit. States that he is still having problems with diarrhea which he believes has become worse.    IPF diagnosed in November 2017.   HPI Shaun [name] 67 y.o. -returns for follow-up of his pulmonary fibrosis IPF.  He is coming up for years since diagnosis.  Overall he reports that the stability in his  symptoms see below.  He tells me that when he goes for a 35/45-minute walk he has difficulty with inclines.  He did say this is overall stable but may be compared to a year or 2 ago it is slightly worse.  Review of his pulmonary function test shows compared to 2017 there are some decline in both FVC and DLCO and compared to early 2020 there is slight decline in DLCO although compared to most recent numbers his DLCO is better.  His weight is stable.  Overall he is tolerating the nintedanib fine but he is having diarrhea.  The diarrhea is uncomfortable but nevertheless is only 2 episodes a week.  Last visit I advised him to take Imodium once a week on a scheduled basis but he says he prefers not to take medicine and he takes it as needed.  If this is then restarting to him using Imodium twice a week.  We discussed and is willing to try once a week schedule.  The other alternative he wanted to discuss was reducing the nintedanib to 100 mg twice daily.  I cautioned against this given the fact his lungs have had only a slight progression nevertheless there is progression and with the lower dose of nintedanib may be it is less effective.  Therefore is going to try the Imodium route.  He has upcoming colonoscopy because of his diarrhea.  We advised the to stop the nintedanib 2 days before and 1-2 days after the procedure.  Similarly for dental procedure he will stop the nintedanib 2 days before and resume 1 or 2 days after the procedure.   He is interested in research protocol.  We discussed several studies.  He is not interested in the Phase 2 [place] study.  We discussed promedior IV infusion study.  He wants to think about this.  We will send in the consent form.  This upcoming study with an inhaler that is already preapproved in the market for pulmonary hypertension.  Is called treprostinil.  If we get the study then he might be interested in this.   OV 12/22/2020  Subjective:  Patient ID: Shaun [name],  male , DOB: 06/26/1955 , age 21 y.o. , MRN: [id number] , ADDRESS: [place] [id number] PCP Mayra Neer, MD Patient Care Team: Mayra Neer, MD as PCP - General (Family Medicine)  This Provider for this visit: Treatment Team:  Attending Provider: Brand Males, MD    12/22/2020 -   Chief Complaint  Patient presents with   Follow-up  PFT performed today. Pt states he is about the same since last visit. Currently has some problems with sinus drainage.    IPF diagnosed in November 2017.  Family of pulmonary fibrosis - last CT March 2020 - IPF-Pro registre HPI Shaun [name] 66 y.o. -returns for follow-up.  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  Overall he is doing stable.  He is adjusted his diet and his diarrhea with nintedanib is under much better control.  His weight is stable.  His symptom scores are stable.  Side effect from diarrhea improved.  He had a 6-minute walk test and this was normal.  He had liver function test today and it is normal.  He had questions about his prognosis.  I explained that given stability of ILD that more than likely is going to be stable in the next 1 year and beyond that it is difficult to predict.  This is based on Calculator.  We looked at his trend line with the FVC and DLCO.  His DLCO this is improved compared to last year.  Therefore overall it appears that he had a decline between 2017 and 2018 but since late 2018 he has been stable till the current time September 2022.  He is always been interested in clinical trials as a care option.  He was given the option of inhaler treprostinil study called [facility].Marland Kitchen  He did not want to do oral drug study.  He has reviewed the consent form he is not interested in this because he has to take the inhaler 4 times each day and more than 9 times each time.  We discussed IV recombinant Pentraxin with goodphase 2 to safety and efficacy data .   I shorten the publication images of the data on this.  I have given him that.  Also give him a detailed consent form forhis for review.  He will read and reflect on this.          07/25/2021 Follow up : IPF  Patient presents for a 32-monthfollow-up.  Patient has underlying IPF.  Patient says overall breathing is doing about the same.  He gets short of breath with heavy activities but tries to remain active every day.  Typically walks about 30 to 40 minutes every day.  Recently was able to go hiking but did notice that when he was going up an incline/he will he did get some shortness of breath and had to rest.  Patient says his weight has been stable.  Patient remains on Ofev.  Patient says his chronic diarrhea has been managed with Imodium.  Typically is only have to take it about every 3 days or so.  And only takes a half a tablet.  He feels that this has become under better control. Patient says he does check his oxygen levels at home.  Has not noticed any lower oxygen levels. PFTs done on July 12, 2021 are essentially stable.  FEV1 at 108%, ratio 80, FVC 100%, DLCO 62%.  This is very similar to his lung function testing in March 2022. Patient is talking to [facility] research team  and has been referred for  the ILD pro registry.     OV 01/03/2022  Subjective:  Patient ID: STamela [name] male , DOB: [birth year], age 66y.o. , MRN: [id number], [address]: [id number] SMayra Neer MD Patient Care Team: SMayra Neer MD as PCP -  General (Family Medicine)  This Provider for this visit: Treatment Team:  Attending Provider: Brand Males, MD    01/03/2022 -   Chief Complaint  Patient presents with   Follow-up    Review PFT's. Breathing is slightly worse. He gets winded walking up an incline. He is walking approx 2 miles per day. He would like a flu vaccine today.     HPI Shaun [name] 66 y.o. -returns for follow-up.  I personally not  seen him in a year.  From a respiratory symptom standpoint he is stable as can be seen below.  His weight is also stable.  He marked mild for diarrhea but this is with Imodium.  He says he is worried about taking Imodium every other day.  His diet is actually more than what is listed.  He says this week and he had to go to [place] to see his grandchildren and he had to stop.  He says the diarrhea is unpredictable.  Therefore he is asking if he can go to the low-dose of nintedanib.  We discussed 4  options including taking a holiday and then going to the low-dose versus taking a holiday from nintedanib and going to the full dose versus itching to pirfenidone versus enrolling in a clinical trial involving due to deutrified pirfenidone.  We weighed all the options.  I personally recommended taking a holiday and then rechallenging himself with nintedanib.  We took a shared decision making to go with a low-dose.  He is aware the low-dose might not be as efficacious but given his relative stability over time he is willing to do that.  He is simply does not want to do the high-dose because of the diarrhea.  Of note he is in the IPF-Pro registry and he will participate in that today.  He will have a high-dose flu shot today as well.  Perfect that all       OV 03/21/2022  Subjective:  Patient ID: Shaun [name], male , DOB: 02-Sep-1955 , age 57 y.o. , MRN: [id number] , ADDRESS: [place] [id number] PCP Mayra Neer, MD Patient Care Team: Mayra Neer, MD as PCP - General (Family Medicine)  This Provider for this visit: Treatment Team:  Attending Provider: Brand Males, MD    03/21/2022 -   Chief Complaint  Patient presents with   Follow-up    Pt states he has been doing okay since last visit and denies any real complaints.   Emphysma = alpha-1 MM   IPF diagnosed in November 2017.   - Family of pulmonary fibrosis - last CT March 2020  - emphysema >  Fibrosis -Repeat CT scan of the chest December 2023: With progression and significant emphysema present. - IPF-Pro registre -Low-dose nintedanib since late November 2023  HPI Shaun [name] 66 y.o. -presents for follow-up.  Since his last visit around Thanksgiving 2023 picked up sinus infection and is having a lot of clearing of the throat and sinus drainage.  He tells me that he is got constant sinus drainage that is perennial.  At the last visit because of significant diarrhea he stopped his nintedanib.  I told him to take an interim holiday and restarted on the low-dose protocol.  He is now 100 mg twice daily for the last 3-4 weeks.  His diarrhea has returned but it is nowhere as severe as the past.  He has had 2 or 3 bouts only.  He is also wondering if it is  because of the recent respiratory infection that he had.  He states he is on the mend.  He did have colored sputum but right now is all clear but he does have cough more than baseline and sinus drainage more than baseline.  There is no wheezing.  He is open to trying short course of prednisone.  He did have a CT scan of the chest that shows significant progression.  He is now noticing more difficulty doing hills when he goes for his daily walks.  Symptom score shows worsening.  He has significant emphysema and this might exclude him from some studies even though his FEV1 FVC ratio is normal or greater than 70  We also discussed [employer] DEXA bone scan study for the study of osteoporosis and osteopenia in patients with IPF.  He is willing to go through this.  I have emailed him a consent form.0 0  0 0 SYMPTOM SCALE - ILD 8/28/202 0 140# 07/15/2019  01/14/2020 138# 12/22/2020 138# 01/03/2022 142# 03/21/2022 144#  O2 use Room air   ra ra ra  Shortness of Breath 0 -> 5 scale with 5 being worst (score 6 If unable to do)       At rest 0 0 0 0 0 0  Simple tasks - showers, clothes change, eating, shaving 0 0 0 0 0 0  Household (dishes,  doing bed, laundry) 0 0  0 0   Shopping 0 0 0 0 0 00  Walkin sel pac 0 0 00 0 0 1  Walking up Stairs _0 Total (40 - 48) Dyspnea Score _1 How bad is your cough? 0 0 1 0 0 0  How bad is your fatigue 0 _2 nausea  1 1 0 0 0  vomit  0 0 0 0 0  diarhea  _3 anxity  _4 depresion  1 1 0 1 1     Simple office walk - 250 feet x 3 laps 02/26/2018  04/29/2018  11/29/2018  07/15/2019  03/21/2022    O2 used Room air Room air Room air ra ra  Number laps completed _5 Comments about pace Good brisk      Resting Pulse Ox/HR 100% and 74/min 100% and 86/min     100% and 76/min 100%and 79/min 99% ad HR 75  Final Pulse Ox/HR 96% and 96/min 97% and 93/min 96% and 99/min 96% and 111/min 93% and HR 100  Desaturated </= 88% _6    Desaturated <= 3% points yes Yes, 3 Yes, 4 Yes, 4 points Yes 6 point  Got Tachycardic >/= 90/min _7   Symptoms at end of test No dyspnea No dyspnea  asympt Lot of throast clearning  Miscellaneous comments x x       CT Chest data - HRCT 03/13/22  Narrative & Impression  CLINICAL DATA:  Follow-up IPF, interstitial lung disease, history of biopsy   EXAM: CT CHEST WITHOUT CONTRAST   TECHNIQUE: Multidetector CT imaging of the chest was performed following the standard protocol without intravenous contrast. High resolution imaging of the lungs, as well as inspiratory and expiratory imaging, was performed.   RADIATION DOSE REDUCTION: This exam was performed according to the departmental dose-optimization program which includes automated exposure control, adjustment of the mA and/or kV according to  patient size and/or use of iterative reconstruction technique.   COMPARISON:  06/10/2018, 10/24/2016, 11/23/2015, 07/08/2015   FINDINGS: Cardiovascular: Scattered aortic atherosclerosis. Normal heart size. No pericardial effusion.   Mediastinum/Nodes: Unchanged prominent mediastinal lymph  nodes. Thyroid gland, trachea, and esophagus demonstrate no significant findings.   Lungs/Pleura: Severe pulmonary fibrosis in a pattern with apical to basal gradient featuring irregular peripheral interstitial opacity, septal thickening, traction bronchiectasis, and large areas of honeycombing at the lung bases. Fibrotic findings are slightly worsened in comparison to most recent examination dated 06/10/2018 and clearly worsened over a longer period of time dating back to 2017. Moderate underlying centrilobular and paraseptal emphysema. No pleural effusion or pneumothorax.   Upper Abdomen: No acute abnormality.   Musculoskeletal: No chest wall abnormality. No acute osseous findings.   IMPRESSION: 1. Severe pulmonary fibrosis in a pattern with apical to basal gradient featuring irregular peripheral interstitial opacity, septal thickening, traction bronchiectasis, and large areas of honeycombing at the lung bases. Fibrotic findings are slightly worsened in comparison to most recent examination dated 06/10/2018 and clearly worsened over a longer period of time dating back to 2017. Findings remain consistent with UIP per consensus guidelines: Diagnosis of Idiopathic Pulmonary Fibrosis: An Official ATS/ERS/JRS/ALAT Clinical Practice Guideline. [place], Iss 5, [phone], Dec 02 2016. 2. Moderate underlying emphysema. 3. Unchanged prominent mediastinal lymph nodes, likely reactive.   Aortic Atherosclerosis (ICD10-I70.0) and Emphysema (ICD10-J43.9).     Electronically Signed   By: Delanna Ahmadi M.D.   On: 03/13/2022 16:19    No results found.    PFT     Latest Ref Rng & Units 12/27/2021    9:48 AM 07/12/2021   11:46 AM 12/22/2020    2:13 PM 06/03/2020   11:27 AM 01/13/2020    8:50 AM 10/14/2019    8:49 AM 07/04/2018    8:59 AM  PFT Results  FVC-Pre L 3.83  3.94  4.03  3.95  3.95  4.07  4.12   FVC-Predicted Pre % 97  100  101  99  99  102  102   Pre  FEV1/FVC % % 87  80  82  82  90  88  88   FEV1-Pre L 3.35  3.16  3.30  3.22  3.55  3.57  3.61   FEV1-Predicted Pre % 115  108  112  109  119  119  119   DLCO uncorrected ml/min/mmHg 12.42  14.62  14.65  15.03  13.56  12.16  14.04   DLCO UNC% % 52  62  62  63  57  51  58   DLCO corrected ml/min/mmHg 12.42  14.62  14.65  15.03  13.56  12.16  13.34   DLCO COR %Predicted % 52  62  62  63  57  51  55   DLVA Predicted % 61  73  74  66  65  61  76        has a past medical history of Dyspnea, Family history of colon cancer, Family history of prostate cancer, Family history of pulmonary fibrosis, Hyperlipemia, and Pneumonia.   reports that he has never smoked. He has never used smokeless tobacco.  Past Surgical History:  Procedure Laterality Date   COLONOSCOPY WITH PROPOFOL N/A 09/21/2014   Procedure: COLONOSCOPY WITH PROPOFOL;  Surgeon: Garlan Fair, MD;  Location: [facility];  Service: Endoscopy;  Laterality: N/A;   FOREARM SURGERY     excision of birth  mark"child"   HERNIA REPAIR     RIH-open   KNEE ARTHROTOMY Left    bursa excision   LUNG BIOPSY Right 02/02/2016   Procedure: LUNG BIOPSY;  Surgeon: [name], MD;  Location: [facility];  Service: Thoracic;  Laterality: Right;   NASAL SEPTUM SURGERY     VIDEO ASSISTED THORACOSCOPY Right 02/02/2016   Procedure: VIDEO ASSISTED THORACOSCOPY;  Surgeon: [name], MD;  Location: [facility];  Service: Thoracic;  Laterality: Right;   WRIST GANGLION EXCISION Left     Allergies  Allergen Reactions   Penicillins Other (See Comments)    UNSPECIFIED REACTION OF CHILDHOOD Has patient had a PCN reaction causing immediate rash, facial/tongue/throat swelling, SOB or lightheadedness with hypotension:unsure Has patient had a PCN reaction causing severe rash involving mucus membranes or skin necrosis:unsure Has patient had a PCN reaction that required hospitalization:No Has patient had a PCN reaction occurring within the last 10  years:No If all of the above answers are "NO", then may proceed with Cephalosporin use.     Immunization History  Administered Date(s) Administered   Fluad Quad(high Dose 65+) 12/02/2020, 01/03/2022   Influenza,inj,Quad PF,6+ Mos 12/09/2016, 11/26/2017, 11/29/2018, 12/31/2019   Influenza-Unspecified 11/02/2015, 12/09/2016   Moderna SARS-COV2 Booster Vaccination 01/31/2020   Moderna Sars-Covid-2 Vaccination 06/05/2019, 07/08/2019, 01/31/2020, 07/05/2020   Pneumococcal Conjugate-13 02/26/2018   Pneumococcal Polysaccharide-23 02/05/2016   Zoster Recombinat (Shingrix) 12/09/2016, 02/10/2017   Zoster, Live 11/02/2015    Family History  Problem Relation Age of Onset   Lung cancer Mother    Colon cancer Mother 50   Pulmonary fibrosis Father        dx in his 52s   Prostate cancer Brother 40   COPD Paternal Grandfather    Other Paternal Uncle        possible lung dx; died around age 28   Other Cousin        possible lung dx, died in her 14s; paternal first cousin   Healthy Son      Current Outpatient Medications:    albuterol (PROVENTIL HFA;VENTOLIN HFA) 108 (90 Base) MCG/ACT inhaler, Inhale 2 puffs into the lungs every 6 (six) hours as needed for wheezing or shortness of breath., Disp: 1 Inhaler, Rfl: 5   aspirin EC 81 MG tablet, Take 81 mg by mouth daily., Disp: , Rfl:    Chlorpheniramine-Pseudoeph 4-60 MG TABS, Take 1 tablet by mouth 2 (two) times daily as needed (for allergies.)., Disp: , Rfl:    Multiple Vitamin (MULTIVITAMIN WITH MINERALS) TABS tablet, Take 1 tablet by mouth daily., Disp: , Rfl:    Nintedanib (OFEV) 100 MG CAPS, MONTH 1: Take one capsule by mouth once daily for one week, then increase to one capsule twice daily, Disp: 53 capsule, Rfl: 0   Nintedanib (OFEV) 100 MG CAPS, Take 1 capsule (100 mg total) by mouth 2 (two) times daily. MONTH 2 and onwards, Disp: 60 capsule, Rfl: 4   predniSONE (DELTASONE) 10 MG tablet, Take 4 tablets (40 mg total) by mouth daily with  breakfast for 1 day, THEN 3 tablets (30 mg total) daily with breakfast for 1 day, THEN 2 tablets (20 mg total) daily with breakfast for 1 day, THEN 1 tablet (10 mg total) daily with breakfast for 1 day, THEN 0.5 tablets (5 mg total) daily with breakfast for 1 day., Disp: 10.5 tablet, Rfl: 0   simvastatin (ZOCOR) 10 MG tablet, Take 10 mg by mouth daily at 6 PM. , Disp: , Rfl: 0  Tiotropium Bromide Monohydrate (SPIRIVA RESPIMAT) 2.5 MCG/ACT AERS, Inhale 2 puffs into the lungs daily., Disp: 4 g, Rfl: 0      Objective:   Vitals:   03/21/22 0922  BP: 120/70  Pulse: 75  SpO2: 99%  Weight: 144 lb 9.6 oz (65.6 kg)  Height: _0  (1.702 m)    Estimated body mass index is 22.65 kg/m as calculated from the following:   Height as of this encounter: _1  (1.702 m).   Weight as of this encounter: 144 lb 9.6 oz (65.6 kg).  _2 @  [facility] Weights   03/21/22 0922  Weight: 144 lb 9.6 oz (65.6 kg)     Physical Exam   General: No distress. thin Neuro: Alert and Oriented x 3. GCS 15. Speech normal Psych: Pleasant Resp:  Barrel Chest - no.  Wheeze - no, Crackles - YES BASE, No overt respiratory distress CVS: Normal heart sounds. Murmurs - no Ext: Stigmata of Connective Tissue Disease - no HEENT: Normal upper airway. PEERL +. No post nasal drip        Assessment:       ICD-10-CM   1. IPF (idiopathic pulmonary fibrosis) (HCC)  Z61.096 Pulmonary function test    2. Familial idiopathic pulmonary fibrosis ([place])  J84.112     3. Therapeutic drug monitoring  Z51.81     4. Diarrhea due to drug  K52.1     5. Pulmonary emphysema, unspecified emphysema type ([place])  J43.9     6. Acute sinusitis, recurrence not specified, unspecified location  J01.90          Plan:     Patient Instructions     ICD-10-CM   1. IPF (idiopathic pulmonary fibrosis) ([place])  J84.112     2. Familial idiopathic pulmonary fibrosis ([place])  J84.112     3. Therapeutic drug monitoring  Z51.81     4.  Diarrhea due to drug  K52.1     5. Pulmonary emphysema, unspecified emphysema type ([place])  J43.9     6. Acute sinusitis, recurrence not specified, unspecified location  J01.90      IPF (idiopathic pulmonary fibrosis) (HCC) Familial idiopathic pulmonary fibrosis (HCC) Therapeutic drug monitoring Diarrhea due to drug  - IPF definitely progressive wit time on CT . Walk test slowly getting worse -Low-dose nintedanib is better tolerated but still causing some amount of diarrhea -Last liver function test normal in October 2023  Plan  -Continue low-dose nintedanib 100 mg twice daily with Imodium as needed -Next liver function test in 2-3 months -Do spirometry and DLCO in 2-3 months -Monitor pulse ox with exertion at home -if dropping below 88% please call us -I emailed you a consent form for [employer] DEXA bone scan study  -Please be in touch with our research team to schedule participation if you are interested  Pulmonary emphysema, unspecified emphysema type ([place])  -You to have associated emphysema and we can try to improve your symptoms by treating this  Plan - Take Spiriva Respimat samples 2.5 mcg dose and take it 2 puffs once daily -If this is helping please do call us and we will send in a prescription  Acute sinusitis, recurrence not specified, unspecified location  -You are resolving from a acute sinusitis around Thanksgiving 2023 but there are still some residual cough and sinus drainage  Plan  - Please take prednisone 40 mg x1 day, then 30 mg x1 day, then 20 mg x1 day, then 10 mg x1 day, and then 5 mg  x1 day and stop     - Followup -2-3 months - 30 min visit; but after spirometry and.  DLCO-- walk and ILD symptoms score at followup  High complex medical condition requiring high risk prescription with intensive therapeutic monitoring requirement    SIGNATURE    Dr. Brand Males, M.D., F.C.C.P,  Pulmonary and Critical Care Medicine Staff Physician, [place] Director - Interstitial Lung Disease  Program  Pulmonary [place] at [place], [place], [address]  Pager: [phone], If no answer or between  15:00h - 7:00h: call [phone] Telephone: [phone]  10:10 AM 03/21/2022

## 2022-03-21 NOTE — Patient Instructions (Addendum)
ICD-10-CM   1. IPF (idiopathic pulmonary fibrosis) ([place])  J84.112     2. Familial idiopathic pulmonary fibrosis ([place])  J84.112     3. Therapeutic drug monitoring  Z51.81     4. Diarrhea due to drug  K52.1     5. Pulmonary emphysema, unspecified emphysema type ([place])  J43.9     6. Acute sinusitis, recurrence not specified, unspecified location  J01.90      IPF (idiopathic pulmonary fibrosis) (HCC) Familial idiopathic pulmonary fibrosis (HCC) Therapeutic drug monitoring Diarrhea due to drug  - IPF definitely progressive wit time on CT . Walk test slowly getting worse -Low-dose nintedanib is better tolerated but still causing some amount of diarrhea -Last liver function test normal in October 2023  Plan  -Continue low-dose nintedanib 100 mg twice daily with Imodium as needed -Next liver function test in 2-3 months -Do spirometry and DLCO in 2-3 months -Monitor pulse ox with exertion at home -if dropping below 88% please call us -I emailed you a consent form for [employer] DEXA bone scan study  -Please be in touch with our research team to schedule participation if you are interested  Pulmonary emphysema, unspecified emphysema type ([place])  -You to have associated emphysema and we can try to improve your symptoms by treating this  Plan - Take Spiriva Respimat samples 2.5 mcg dose and take it 2 puffs once daily -If this is helping please do call us and we will send in a prescription  Acute sinusitis, recurrence not specified, unspecified location  -You are resolving from a acute sinusitis around Thanksgiving 2023 but there are still some residual cough and sinus drainage  Plan  - Please take prednisone 40 mg x1 day, then 30 mg x1 day, then 20 mg x1 day, then 10 mg x1 day, and then 5 mg x1 day and stop     - Followup -2-3 months - 30 min visit; but after spirometry and.  DLCO-- walk and ILD symptoms score at followup

## 2022-04-06 ENCOUNTER — Other Ambulatory Visit: Payer: Self-pay | Admitting: Internal Medicine

## 2022-04-06 DIAGNOSIS — J84112 Idiopathic pulmonary fibrosis: Secondary | ICD-10-CM

## 2022-04-06 DIAGNOSIS — Z006 Encounter for examination for normal comparison and control in clinical research program: Secondary | ICD-10-CM

## 2022-04-10 ENCOUNTER — Encounter: Payer: Medicare Other | Admitting: Internal Medicine

## 2022-04-10 ENCOUNTER — Ambulatory Visit (HOSPITAL_BASED_OUTPATIENT_CLINIC_OR_DEPARTMENT_OTHER)
Admission: RE | Admit: 2022-04-10 | Discharge: 2022-04-10 | Disposition: A | Discharge status: Home / Self Care | Payer: Self-pay | Source: Ambulatory Visit | Attending: Internal Medicine | Admitting: Internal Medicine

## 2022-04-10 DIAGNOSIS — Z006 Encounter for examination for normal comparison and control in clinical research program: Secondary | ICD-10-CM

## 2022-04-10 DIAGNOSIS — J84112 Idiopathic pulmonary fibrosis: Secondary | ICD-10-CM

## 2022-04-10 LAB — VITAMIN D 25 HYDROXY (VIT D DEFICIENCY, FRACTURES): VITD: 24.4 ng/mL — ABNORMAL LOW (ref 30.00–100.00)

## 2022-04-10 LAB — ALKALINE PHOSPHATASE: Alkaline Phosphatase: 67 U/L (ref 39–117)

## 2022-04-10 LAB — PHOSPHORUS: Phosphorus: 2.9 mg/dL (ref 2.3–4.6)

## 2022-04-10 LAB — CALCIUM: Calcium: 9.4 mg/dL (ref 8.4–10.5)

## 2022-04-10 NOTE — Research (Signed)
[place]:  An Exploratory Assessment of Bone Disease in Idiopathic Pulmonary Fibrosis    Primary Endpoint  The primary objectives of this exploratory study are to broadly estimate: the prevalence and severity of osteopenia and osteoporosis in those with IPF, and the prevalence of vertebral fragility fracture(s) in those with IPF.  Study Code: [id number] Protocol Edition: Number 2.0, Date 30 November 2021 Sponsor Name: [employer] Primary Investigator: Dr. Brand Males  Protocol Version for 04/10/2022 date of 30 Nov 2021  Consent Version for 04/10/2022 date of 22 Feb 2022    Key Feature of Study: This is an exploratory study to broadly assess bone disease in those with IPF. Participants are expected to attend one visit consisting of informed consent, medical history, lab blood draw, lateral thoracic and lumbar x-rays, and a DXA bone density scan. X-rays and DXA scan may be done on the same day as screening visit or within 7 days after screening. Study participation is concluded once visit, labs, and radiologic studies completed.  Key Inclusion Criteria:   1. Male or male participants aged 73 years or older with mild-to-moderate IPF (defined as FVC >= 45% predicted at most recent measurement). 2.. Negative pregnancy test (high sensitivity urine pregnancy test) for male participants prior to imaging.  Key Exclusion Criteria: 1. Non-IPF interstitial lung disease. 2. (FEV1))/FVC ratio <0.70. 3. Active enrollment or recent participation (ie, within 3 months or less than 5 half-lives of investigational medical product whichever is longer) in an interventional clinical trial where participants received an investigational medical product or placebo. 4. Current systemic corticosteroid treatment. 5. Prior history of hip, thoracic spine, or lumbar spine surgery if residual hardware remains within these areas. 6. Prior history of single or bilateral lung transplantation. 7. Current or prior  history of a hematologic malignancy or metastatic cancer. 8. Nuclear medicine study within the past 1 week or an X-ray procedure using contrast within the past 72 hours. 9. Individuals who have had calcium supplements within 24 hours of the dual X-ray absorptiometry (DXA) scan. Note: Individuals using calcium supplements are not excluded if they temporarily discontinue their calcium 24 hours prior to their DXA scan. Calcium supplements may be resumed following their DXA scan. 88. Women who are pregnant.  Safety Data: Not Applicable.  No interventional product given.   [facility] [place] Coordinator note:   This visit for [name] with DOB: October 18, 1955 on 04/10/2022 for the above protocol is Visit/Encounter # 1  and is for purpose of research.   The consent for this encounter is under Protocol Version 2.0,  Consent Version 1 and is currently IRB approved.   Subject expressed continued interest and consent in continuing as a study subject. Subject thanked for participation in research and contribution to science. In this visit 04/10/2022 the subject will be evaluated by Sub-Investigator named Tammy Parrett. This research coordinator has verified that the above investigator is up to date with his/her training logs.   The Subject was informed that the PI  continues to have oversight of the subject's visits and course through relevant discussions, reviews, and also specifically of this visit by routing of this note to the PI.  This visit is a key visit of screening. The PI is not available for this visit.   Because the PI is NOT available, the Sub-Investigator reported and CRC has confirmed that the PI has discussed the visit a-priori with the Sub-Investigator.   Signed by  Darius Bump Pool  Clinical Research Coordinator  [facility]  [place], [place] 12:23 PM 04/10/2022

## 2022-04-16 ENCOUNTER — Telehealth: Payer: Self-pay | Admitting: Internal Medicine

## 2022-04-16 NOTE — Progress Notes (Signed)
Moderate DJD L5-s1 - typically c/w age. Will not call in thee results

## 2022-04-16 NOTE — Telephone Encounter (Signed)
Bone scan - shows osteopenia Spine xray normal Vit D is LOW and this is a risk factor for osteoporosis  Plan  - - vitamin D3 50,000 (50k) units once a week x 12 weeks, then once a month on first of each month  - this cannot be vitamin d2. But has to to be Vit D3  - If pharmacy only has vitamin d2 let me know, then he should get replesta (spl vit d3) at same dose - replesta can be obtained by following instructions at [email or web address]  - also followu with PCP Mayra Neer, MD

## 2022-04-17 MED ORDER — VITAMIN D3 1.25 MG (50000 UT) PO CAPS: ORAL_CAPSULE | ORAL | 5 refills | Status: DC

## 2022-04-17 NOTE — Telephone Encounter (Signed)
Called and spoke with pt letting him know the results and recs per MR and he verbalized understanding. Verified preferred pharmacy and sent Rx in for pt. Nothing further needed.

## 2022-05-14 NOTE — Patient Instructions (Signed)
  IPF (idiopathic pulmonary fibrosis) (HCC) Familial idiopathic pulmonary fibrosis (HCC) Therapeutic drug monitoring Diarrhea due to drug  - IPF definitely progressive wit time on CT . There is dramatic reductio (5-14%) in breathing test and walk test since Sept 2023 -Low-dose nintedanib is better tolerated but seems dramatic decline in breathing test after ofev dose was reduced -Last liver function test normal in October 2023  Plan - check LFT 05/15/2022  -Continue ofev but time to rechallenge back to higer dose  - start by taking 150mg  at night and 100mg  daily in day time for a week -> then go to 150mg  twice daily  - if this is working well: call in 3 weeks so we can facilitate getting full dose -Next liver function test in -3 months -refer to Dr Teressa Lower or Dr Marca Ancona for right heart cath -Monitor pulse ox with exertion at home -if dropping below 88% please call us   Pulmonary emphysema, unspecified emphysema type (HCC)  -You to have associated emphysema and we can try to improve your symptoms by treating this  Plan - Continue Spiriva Respimat samples 2.5 mcg dose and take it 2 puffs once daily     - Followup -6- weeks - 30 min visit; walk and ILD symptoms score at followup  - visit can be with APP

## 2022-05-14 NOTE — Progress Notes (Unsigned)
OV 02/22/2016  Chief Complaint  Patient presents with   Pulmonary Consult    Pt referred by Dr.Byrum for ILD. Pt states he feels he is still recovering from the lung biopsy. Pt denies cough and CP/tightness.     2nd opinionand transfer of care for ILD/UIP - IPF from DR Byrum. Presents with his wife. They related to Dr. Stanford Breed cardiologist   67 year old male found to have crackles on physical exam and subsequently surgical lung biopsy 02/02/2016 showed UIP as read by pathologist in [place].  [employer] of chest physicians interstitial lung disease questionnaire  Symptoms: He does not cough and is not troubled by shortness of breath except with strenuous exercise. Onset is only "recently" Past medical history: Positive for chronic sinus drainage mild. Recently after surgical lung biopsy was being a few bone unintentional weight loss. Otherwise negative Personal exposure history: He smoked occasional cigarettes having grown up on her [place] farm starting at 36 including when he was age 82.  Family history: All the male members in his family who smoke developed COPD but also did not smoke to the pulmonary fibrosis especially his father and his paternal uncles. His dad was diagnosed with pulmonary fibrosis at age of 71 in 05-23-2001 and died from progressive hypoxemic respiratory failure after what sounds like an IPF flare in 2005/05/23.  Home exposure history: He is lived in the same house since 05-24-87. The home does not have a humidifier or sound or hot tub or Jacuzzi. He is not exposed to any birds. 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 Approximately 30 years of work ending 4 years ago used to cut through dry walls and was exposed to concrete dust and drywall dust and possibly asbestos. HE is to  cut through insulation safe. 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 Did not work in a minor quarry a Teacher, music on Agricultural engineer. Denies any exposure to birds of feathers  Autoimmune history: Is negative.  History of exposure to pulmonary toxic drugs: Negative   Lab work - April 2017 and November 2017 extensive autoimmune and vascular this panel is negative  - High resolution CT scan of the chest August 2017: UIP pattern that is classic - personally visualized. There is also 5 mm right lower lobe lung nodule and a 5.9 cm left-sided lower bulla [stable compared to April 2017]   Severity - Pulmonary function test 08/02/2015: Isolated reduction in diffusion capacity 17.33/61%. Otherwise FVC 4 L/93% and normal. Total lung capacity 5.9 L/91% and normal - Walking desaturation test 185 feet 3 laps on room air in the office 02/22/2016: Did not desaturate but gets symptomatic   Emotional state: He is extremely worried about the diagnosis of IPF. This is particularly so because he has a strong family history and his father died within 73 years usually was diagnosed much order at age 13. Patient is worried that he got diagnosed with the same problem and much younger age of 61 and feels that he only will have a few years to live. Multiple times during the visit he was teary-eyed   OV 07/21/2016  Chief Complaint  Patient presents with   Follow-up  Pt denies change in breathing since last OV. Pt denies cough and SOB and CP/tightness.      Follow-up idiopathic pulmonary fibrosis familial variety  He presents with his wife. Overall he is stable. 4 routine activities of daily living and mowing the yard he does not have any shortness of breath or cough. He did find the mountains recently been quite dyspneic. This is worse than June 2017 with for the diagnosis of IPF. Overall he is tolerating the Ofev just  fine except occasionally he has some GI discomfort.He is questions about research trials, exercise, pulmonary fibrosis foundation and family genetics.he is quite active and diffuse pulmonary rehabilitation is easy for him. He is willing to go to [facility] and work on aerobic strengthening and muscle conditioning  Notice that she's not had pulmonary function testing since May 2017    has a past medical history of Dyspnea; Hyperlipemia; and Pneumonia.   reports that he has never smoked. He has never used smokeless tobacco.  OV 10/25/2016  Chief Complaint  Patient presents with   Follow-up    Pt here after PFT. Pt states his breathing is unchanged. Pt c/o PND and dry cough. Pt denies f/c/s and CP/tightenss.    Follow-up mild idiopathic pulmonary fibrosis he's on Ofev since  NOv 2017  Here with his wife. Overall he is stable. His pulmonary function tests and CT scan of the chest shows continued stability for 1 year. Symptom-wise also he is stable. His dyspnea when doing mountains. He is kayaking and biking and is able to outpace his wife. During this time he does not check his pulse oximetry. He does not have one. I've advised that he can get one. He is asking about familial issues, research and also prognosis we went over all this. Of note CT scan of the chest shows also he has concomitant emphysema. He prefers to avoid medications but he is interested in taking an inhaler if it means improving his dyspnea. He is active with pulmonary fibrosis foundation patient support groupo. Of note he reports easy bruising in his forearm when he bumps into something. His wife thinks it's been there all along and is unchanged. He is wondering if that this is related to Ofev and asa. We did discuss about the bleeding risk of Ofev and the need to hold this before any procedure. Overall he tolerates Ofev just fine except for occasional diarrhea once a month. This is associated with fiber food    OV   02/26/2017  Chief Complaint  Patient presents with   Follow-up    PFT done today. Pt still taking OFEV and doing good on it. Denies any complaints with cough, SOB, or CP.    Follow-up idiopathic pulmonary fibrosis on Nintendanib since November 2017  This time his wife is not here with him.  He has now been on nintedanib for a year.  Overall he is tolerating it fine except for mild occasional diarrhea.  He continues to be very active working out in the yard and doing daily walks with a few miles.  He notices only mild dyspnea with this relieved by rest.  He does not feel his health has declined in the last 1 year.  He does have concomitant emphysema on the CT scan but not apparent on the PFTs.  I asked him to try Spiriva last time but it did not help.  He is dissatisfied with occasional albuterol use.  He did have pulmonary function test today which is documented  below.  The FVC is stable but there is a change in the DLCO but he is not feeling it.  He is on IPF registry by [facility];  he is due for a study visit today.  He is interested in further studies especially [place].  We discussed this in detail brief.  He has had genetics testing he got the result.  He is worried insurance will deny this.  He is participating in the pulmonary fibrosis foundation patient support group.  He is asking details about the March 17, 2017 local [place]   OV 05/22/2017  Chief Complaint  Patient presents with   Follow-up    PFT done today.  Pt stated he had the flu 05/05/17 and was not able to eat on 05/06/17 so he missed two OFEV doses. States he has had diarrhea a couple times since then but is now doing fine with help from immodium.  States his breathing has been doing good.      Follow-up idiopathic pulmonary fibrosis on Nintendanib since November 3728   67 year old male with idiopathic pulmonary fibrosis.  He is on stable dose of nintedanib.  He tells me earlier this month he had stomach flu and that he suddenly  had subjective sense of fever associated with diarrhea and vomiting.  This resolved in 2 days.  Since then he is lost a couple of pounds of weight.  He is worried about tolerating the nintedanib.  In addition primary function test shows a 3% decline in FVC and 2 years and a 12% decline in DLCO in 2 years.  He is now wondering if the drug is effective.  Overall other than the recent illness and occasional mild diarrhea he is tolerated off of just fine.  But he is now worried about the tolerance.  He has been advised by [facility] nursing support team about taking protein with nintedanib to offset side effects and he has questions about this.   OV 08/14/2017  Chief Complaint  Patient presents with   Follow-up    PFT done today. Pt states he has been doing good since last visit and denies any complaints.     Follow-up idiopathic pulmonary fibrosis surgical lung biopsy diagnosis February 02, 2016 on Nintendanib since November 2017  Mr. Flippen continues to do well.  At the time of last visit there was concern about weight loss and also progression in lung function.  However at that time he had a respiratory infection.  Today he comes back and tells me that he has gained 1 pound of weight.  His respiratory symptoms have resolved he is back to being at a stable mild exertional dyspnea.  He is extremely active.  Concordant with his symptom improvement his FVC has improved and his DLCO is stable.  Overall his pulmonary function test is stable since November 2018 at least with a DLCO although the DLCO has progressed since 2017.  His FVC has been stable all along since May 2017.  He is not interested in the [place] study because of the oral pill and can make his diarrhea potentially worse if he gets randomized to the drug.  He is interested in an upcoming inhaler trial of IPF.  He is active in the support group and is asking about the next meeting.  He is not on oxygen.        Ct Chest High  Resolution  Result Date: 10/25/2016 CLINICAL DATA:  Interstitial lung disease, former smoker, cough. Lung biopsy November 2017. EXAM:  CT CHEST WITHOUT CONTRAST TECHNIQUE: Multidetector CT imaging of the chest was performed following the standard protocol without intravenous contrast. High resolution imaging of the lungs, as well as inspiratory and expiratory imaging, was performed. COMPARISON:  11/23/2015 and 07/08/2015. FINDINGS: Cardiovascular: Vascular structures are unremarkable. Heart size normal. No pericardial effusion. Mediastinum/Nodes: Mediastinal lymph nodes are not enlarged by CT size criteria. Hilar regions are difficult to evaluate without IV contrast. No axillary adenopathy. Esophagus is grossly unremarkable. Lungs/Pleura: Centrilobular and paraseptal emphysema with a large bullous lesion in the left lower lobe. Post biopsy changes in the right upper, right middle and right lower lobes. Superimposed basilar predominant subpleural reticulation, ground-glass and traction bronchiectasis/bronchiolectasis, likely stable from baseline examination of 07/08/2015 when differences in slice collimation are considered. There may be scattered honeycombing. Mild subpleural nodularity along the right major fissure is unchanged from 07/08/2015, indicative of subpleural lymph nodes. No pleural fluid. Airway is unremarkable. Upper Abdomen: Visualized portions of the liver and gallbladder are unremarkable. Mild nodular thickening of both adrenal glands. Visualized portion of the right kidney is unremarkable. 2.5 cm low-attenuation lesion in the left kidney is likely a cyst. Visualized portions of the spleen, pancreas, stomach and bowel are grossly unremarkable. No upper abdominal adenopathy. Musculoskeletal: No worrisome lytic or sclerotic lesions. Degenerative changes in the spine. IMPRESSION: 1. Pulmonary parenchymal pattern of fibrosis is likely stable from 07/08/2015 when differences in slice collimation are  considered. Findings are in keeping with the pathologic diagnosis of usual interstitial pneumonitis. 2.  Emphysema (ICD10-J43.9). Electronically Signed   By: Lorin Picket M.D.   On: 10/25/2016 08:23   OV 02/26/2018  Subjective:  Patient ID: Shaun [name], male , DOB: July 08, 1955 , age 12 y.o. , MRN: [id number] , ADDRESS: [place] [address]   02/26/2018 -   Chief Complaint  Patient presents with   Follow-up    breathing well     HPI CHARAN PRIETO 67 y.o. -follow-up familial IPF.  Last seen in May 2019.  After that in July 2019 he is screened for the electric face to inhaler study but he screen failed because he is standard of care CT scan of the chest from July 2018 was not per sponsored protocol or 2018 ATS protocol.  At this point in time he continues on nintedanib.  He is not taking proton pump inhibitor and wants to know if that is okay.  He feels he is stable.  There are no new issues.  The main thing is with nintedanib once a month he will have sporadic diarrhea.  He says he has been unable to control this but from a respiratory standpoint he feels he is stable.  There are no new medical issues.  He is up-to-date with his vaccines although immunization record review shows that he could benefit from Prevnar 13 which she has not had.  He continues to be actively engaged in the patient support group.   ROS - per HPI    OV 04/29/2018  Subjective:  Patient ID: Shaun [name], male , DOB: May 21, 1955 , age 26 y.o. , MRN: [id number] , ADDRESS: [place] [id number]   04/29/2018 -   Chief Complaint  Patient presents with   Follow-up    PFT performed today.  Pt states he is about the same since last visit.      HPI JULLIEN GRANQUIST 67 y.o. -returns for IPF follow-up.  He is on nintedanib.  He reports continued stability.  He  still has ongoing occasional issue with diarrhea that happened suddenly every few weeks.  It is so random.  It bothers  him and does affect his quality of life.  He takes Imodium in response.  We did discuss about taking Imodium proactively once a week.  He is now to give this a try.  He continues to be interested in pulmonary reserve study particularly face to inhaler study sponsored by [employer].  Our research department is getting ready and probably should be able to screen him in the next few to several weeks.  His last pulmonary function test was today and shows continued stability.  Last liver function test was November 2019 and was normal.  His walk test today is baseline     ROS   OV 11/29/2018  Subjective:  Patient ID: Shaun [name], male , DOB: [birth year] , age 49 y.o. , MRN: [id number] , ADDRESS: [place] [address]   11/29/2018 -   Chief Complaint  Patient presents with   IPF (idiopathic pulmonary fibrosis)    No issues, states medication is working for him.   Follow-up idiopathic pulmonary fibrosis on Nintendanib since November 2017  HPI TEANCUM BRULE 67 y.o. -presents for follow-up of IPF.  Last visit was early in 2020.  And then the pandemic care.  He try to get down an inhaler trial of phase 2 for IPF - [place] but he screen failed because of technical issues with the spirometry.  He says overall he is doing well.  He is just following low risk activities.  He has a christening coming up for his grandson.  This will be outdoors in [place].  A total of 12 people will attend.  Everybody will be Masked.  He continues taking nintedanib and does not have any issues tolerating it.  Last liver function test was over 6 months ago.  He needs another one now.  He is also asking for a flu shot.  His symptom score and walking desaturation test showed continued stability.  OV 07/15/2019  Subjective:  Patient ID: Shaun [name], male , DOB: 1955/08/28 , age 22 y.o. , MRN: [id number] , ADDRESS: [place] [address]   07/15/2019 -   Chief Complaint  Patient  presents with   Follow-up    Follow-up idiopathic pulmonary fibrosis on Nintendanib since November 2017   HPI JORIAN WILLHOITE 67 y.o. -presents for IPF follow-up.  Last seen August 2020.  This visit was supposed to have a pulmonary function test but this was not scheduled.  Patient overall feels stable.  Dyspnea is stable and minimal.  He walks on a regular basis 45 minutes.  He tolerates nintedanib but has mild symptoms.  He is asking about going to a lower dose but tells me that if the drug has been beneficial he says he can manage with the current symptoms.  He says he has had the COVID-19 vaccine at this point.  Last second dose was 1 week ago.  He is in the IPF pro registry.  He is not on any interventional trial.  He screen fell for an inhaler trial but that has been withdrawn because of safety reasons.  He was never a participant in the trial.  He is interested in other trials.  He reported that he is going to be 67 years old and will become a Medicare recipient in the next several months.  He is going to look for insurance options.  Walking desaturation test was stable.  He is okay waiting till the next visit for spirometry.     OV 01/14/2020   Subjective:  Patient ID: Shaun [name], male , DOB: [birth year], age 44 y.o. years. , MRN: [id number],  ADDRESS: [place] [address] PCP  Mayra Neer, MD Providers : Treatment Team:  Attending Provider: Brand Males, MD Patient Care Team: Mayra Neer, MD as PCP - General (Family Medicine)    Chief Complaint  Patient presents with   Follow-up    PFT performed 10/12.  Pt states he has been doing okay since last visit. States that he is still having problems with diarrhea which he believes has become worse.    IPF diagnosed in November 2017.   HPI ORIEL RUMBOLD 67 y.o. -returns for follow-up of his pulmonary fibrosis IPF.  He is coming up for years since diagnosis.  Overall he reports that the  stability in his symptoms see below.  He tells me that when he goes for a 35/45-minute walk he has difficulty with inclines.  He did say this is overall stable but may be compared to a year or 2 ago it is slightly worse.  Review of his pulmonary function test shows compared to 2017 there are some decline in both FVC and DLCO and compared to early 2020 there is slight decline in DLCO although compared to most recent numbers his DLCO is better.  His weight is stable.  Overall he is tolerating the nintedanib fine but he is having diarrhea.  The diarrhea is uncomfortable but nevertheless is only 2 episodes a week.  Last visit I advised him to take Imodium once a week on a scheduled basis but he says he prefers not to take medicine and he takes it as needed.  If this is then restarting to him using Imodium twice a week.  We discussed and is willing to try once a week schedule.  The other alternative he wanted to discuss was reducing the nintedanib to 100 mg twice daily.  I cautioned against this given the fact his lungs have had only a slight progression nevertheless there is progression and with the lower dose of nintedanib may be it is less effective.  Therefore is going to try the Imodium route.  He has upcoming colonoscopy because of his diarrhea.  We advised the to stop the nintedanib 2 days before and 1-2 days after the procedure.  Similarly for dental procedure he will stop the nintedanib 2 days before and resume 1 or 2 days after the procedure.   He is interested in research protocol.  We discussed several studies.  He is not interested in the Phase 2 [place] study.  We discussed promedior IV infusion study.  He wants to think about this.  We will send in the consent form.  This upcoming study with an inhaler that is already preapproved in the market for pulmonary hypertension.  Is called treprostinil.  If we get the study then he might be interested in this.   OV 12/22/2020  Subjective:  Patient ID:  Shaun [name], male , DOB: 1955/08/08 , age 46 y.o. , MRN: [id number] , ADDRESS: [place] [id number] PCP Mayra Neer, MD Patient Care Team: Mayra Neer, MD as PCP - General (Family Medicine)  This Provider for this visit: Treatment Team:  Attending Provider: Brand Males, MD    12/22/2020 -   Chief Complaint  Patient presents with   Follow-up  PFT performed today. Pt states he is about the same since last visit. Currently has some problems with sinus drainage.    IPF diagnosed in November 2017.  Family of pulmonary fibrosis - last CT March 2020 - IPF-Pro registre HPI SHRAVAN SALAHUDDIN 67 y.o. -returns for follow-up.  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  Overall he is doing stable.  He is adjusted his diet and his diarrhea with nintedanib is under much better control.  His weight is stable.  His symptom scores are stable.  Side effect from diarrhea improved.  He had a 6-minute walk test and this was normal.  He had liver function test today and it is normal.  He had questions about his prognosis.  I explained that given stability of ILD that more than likely is going to be stable in the next 1 year and beyond that it is difficult to predict.  This is based on Calculator.  We looked at his trend line with the FVC and DLCO.  His DLCO this is improved compared to last year.  Therefore overall it appears that he had a decline between 2017 and 2018 but since late 2018 he has been stable till the current time September 2022.  He is always been interested in clinical trials as a care option.  He was given the option of inhaler treprostinil study called [facility].Marland Kitchen  He did not want to do oral drug study.  He has reviewed the consent form he is not interested in this because he has to take the inhaler 4 times each day and more than 9 times each time.  We discussed IV recombinant Pentraxin with goodphase 2 to safety  and efficacy data .  I shorten the publication images of the data on this.  I have given him that.  Also give him a detailed consent form forhis for review.  He will read and reflect on this.          07/25/2021 Follow up : IPF  Patient presents for a 47-monthfollow-up.  Patient has underlying IPF.  Patient says overall breathing is doing about the same.  He gets short of breath with heavy activities but tries to remain active every day.  Typically walks about 30 to 40 minutes every day.  Recently was able to go hiking but did notice that when he was going up an incline/he will he did get some shortness of breath and had to rest.  Patient says his weight has been stable.  Patient remains on Ofev.  Patient says his chronic diarrhea has been managed with Imodium.  Typically is only have to take it about every 3 days or so.  And only takes a half a tablet.  He feels that this has become under better control. Patient says he does check his oxygen levels at home.  Has not noticed any lower oxygen levels. PFTs done on July 12, 2021 are essentially stable.  FEV1 at 108%, ratio 80, FVC 100%, DLCO 62%.  This is very similar to his lung function testing in March 2022. Patient is talking to [facility] research team  and has been referred for  the ILD pro registry.     OV 01/03/2022  Subjective:  Patient ID: STamela [name] male , DOB: 221-May-1957, age 67y.o. , MRN: [id number], [address]: [id number] SMayra Neer MD Patient Care Team: SMayra Neer MD as PCP -  General (Family Medicine)  This Provider for this visit: Treatment Team:  Attending Provider: Brand Males, MD    01/03/2022 -   Chief Complaint  Patient presents with   Follow-up    Review PFT's. Breathing is slightly worse. He gets winded walking up an incline. He is walking approx 2 miles per day. He would like a flu vaccine today.     HPI EURIAH MATLACK 67 y.o. -returns for follow-up.   I personally not seen him in a year.  From a respiratory symptom standpoint he is stable as can be seen below.  His weight is also stable.  He marked mild for diarrhea but this is with Imodium.  He says he is worried about taking Imodium every other day.  His diet is actually more than what is listed.  He says this week and he had to go to [place] to see his grandchildren and he had to stop.  He says the diarrhea is unpredictable.  Therefore he is asking if he can go to the low-dose of nintedanib.  We discussed 4  options including taking a holiday and then going to the low-dose versus taking a holiday from nintedanib and going to the full dose versus itching to pirfenidone versus enrolling in a clinical trial involving due to deutrified pirfenidone.  We weighed all the options.  I personally recommended taking a holiday and then rechallenging himself with nintedanib.  We took a shared decision making to go with a low-dose.  He is aware the low-dose might not be as efficacious but given his relative stability over time he is willing to do that.  He is simply does not want to do the high-dose because of the diarrhea.  Of note he is in the IPF-Pro registry and he will participate in that today.  He will have a high-dose flu shot today as well.  Perfect that all       OV 03/21/2022  Subjective:  Patient ID: Shaun [name], male , DOB: [birth year] , age 89 y.o. , MRN: [id number] , ADDRESS: [place] [id number] PCP Mayra Neer, MD Patient Care Team: Mayra Neer, MD as PCP - General (Family Medicine)  This Provider for this visit: Treatment Team:  Attending Provider: Brand Males, MD    03/21/2022 -   Chief Complaint  Patient presents with   Follow-up    Pt states he has been doing okay since last visit and denies any real complaints.   Emphysma = alpha-1 MM   IPF diagnosed in November 2017.   - Family of pulmonary fibrosis - last CT March 2020  -  emphysema > Fibrosis -Repeat CT scan of the chest December 2023: With progression and significant emphysema present. - IPF-Pro registre -Low-dose nintedanib since late November 2023  HPI HAWKINS SEAMAN 67 y.o. -presents for follow-up.  Since his last visit around Thanksgiving 2023 picked up sinus infection and is having a lot of clearing of the throat and sinus drainage.  He tells me that he is got constant sinus drainage that is perennial.  At the last visit because of significant diarrhea he stopped his nintedanib.  I told him to take an interim holiday and restarted on the low-dose protocol.  He is now 100 mg twice daily for the last 3-4 weeks.  His diarrhea has returned but it is nowhere as severe as the past.  He has had 2 or 3 bouts only.  He is also wondering if it is  because of the recent respiratory infection that he had.  He states he is on the mend.  He did have colored sputum but right now is all clear but he does have cough more than baseline and sinus drainage more than baseline.  There is no wheezing.  He is open to trying short course of prednisone.  He did have a CT scan of the chest that shows significant progression.  He is now noticing more difficulty doing hills when he goes for his daily walks.  Symptom score shows worsening.  He has significant emphysema and this might exclude him from some studies even though his FEV1 FVC ratio is normal or greater than 70  We also discussed [employer] DEXA bone scan study for the study of osteoporosis and osteopenia in patients with IPF.  He is willing to go through this.  I have emailed him a consent form.0 0  0 CT Chest data - HRCT 03/13/22  Narrative & Impression  CLINICAL DATA:  Follow-up IPF, interstitial lung disease, history of biopsy   EXAM: CT CHEST WITHOUT CONTRAST   TECHNIQUE: Multidetector CT imaging of the chest was performed following the standard protocol without intravenous contrast. High resolution imaging of  the lungs, as well as inspiratory and expiratory imaging, was performed.   RADIATION DOSE REDUCTION: This exam was performed according to the departmental dose-optimization program which includes automated exposure control, adjustment of the mA and/or kV according to patient size and/or use of iterative reconstruction technique.   COMPARISON:  06/10/2018, 10/24/2016, 11/23/2015, 07/08/2015   FINDINGS: Cardiovascular: Scattered aortic atherosclerosis. Normal heart size. No pericardial effusion.   Mediastinum/Nodes: Unchanged prominent mediastinal lymph nodes. Thyroid gland, trachea, and esophagus demonstrate no significant findings.   Lungs/Pleura: Severe pulmonary fibrosis in a pattern with apical to basal gradient featuring irregular peripheral interstitial opacity, septal thickening, traction bronchiectasis, and large areas of honeycombing at the lung bases. Fibrotic findings are slightly worsened in comparison to most recent examination dated 06/10/2018 and clearly worsened over a longer period of time dating back to 2017. Moderate underlying centrilobular and paraseptal emphysema. No pleural effusion or pneumothorax.   Upper Abdomen: No acute abnormality.   Musculoskeletal: No chest wall abnormality. No acute osseous findings.   IMPRESSION: 1. Severe pulmonary fibrosis in a pattern with apical to basal gradient featuring irregular peripheral interstitial opacity, septal thickening, traction bronchiectasis, and large areas of honeycombing at the lung bases. Fibrotic findings are slightly worsened in comparison to most recent examination dated 06/10/2018 and clearly worsened over a longer period of time dating back to 2017. Findings remain consistent with UIP per consensus guidelines: Diagnosis of Idiopathic Pulmonary Fibrosis: An Official ATS/ERS/JRS/ALAT Clinical Practice Guideline. [place], Iss 5, [phone], Dec 02 2016. 2. Moderate underlying  emphysema. 3. Unchanged prominent mediastinal lymph nodes, likely reactive.   Aortic Atherosclerosis (ICD10-I70.0) and Emphysema (ICD10-J43.9).     Electronically Signed   By: Delanna Ahmadi M.D.   On: 03/13/2022 16:19    No results found.   OV 05/14/2022  Subjective:  Patient ID: Shaun [name], male , DOB: [birth year] , age 35 y.o. , MRN: [id number] , ADDRESS: [place] [id number] PCP Mayra Neer, MD Patient Care Team: Mayra Neer, MD as PCP - General (Family Medicine)  This Provider for this visit: Treatment Team:  Attending Provider: Brand Males, MD    05/14/2022 -  No chief complaint on file.    HPI IZAAC REISIG 67 y.o. -  0 SYMPTOM SCALE - ILD 8/28/202 0 140# 07/15/2019  01/14/2020 138# 12/22/2020 138# 01/03/2022 142# 03/21/2022 144#   O2 use Room air   ra ra ra   Shortness of Breath 0 -> 5 scale with 5 being worst (score 6 If unable to do)        At rest 0 0 0 0 0 0   Simple tasks - showers, clothes change, eating, shaving 0 0 0 0 0 0   Household (dishes, doing bed, laundry) 0 0  0 0    Shopping 0 0 0 0 0 00   Walkin sel pac 0 0 00 0 0 1   Walking up Stairs '1 1 1 1 1 2   '$ Total (40 - 48) Dyspnea Score '1 1 1 1 1 3   '$ How bad is your cough? 0 0 1 0 0 0   How bad is your fatigue 0 '1 1 1 1 1   '$ nausea  1 1 0 0 0   vomit  0 0 0 0 0   diarhea  '1 2 1 2 1   '$ anxity  '1 1 1 1 1   '$ depresion  1 1 0 1 1      Simple office walk - 250 feet x 3 laps 02/26/2018  04/29/2018  11/29/2018  07/15/2019  03/21/2022    O2 used Room air Room air Room air ra ra  Number laps completed '3 3 3 3 3  '$ Comments about pace Good brisk      Resting Pulse Ox/HR 100% and 74/min 100% and 86/min     100% and 76/min 100%and 79/min 99% ad HR 75  Final Pulse Ox/HR 96% and 96/min 97% and 93/min 96% and 99/min 96% and 111/min 93% and HR 100  Desaturated </= 88% no no no no No   Desaturated <= 3% points yes Yes, 3 Yes, 4 Yes, 4 points Yes 6 point   Got Tachycardic >/= 90/min yes yes yes yes yes  Symptoms at end of test No dyspnea No dyspnea  asympt Lot of throast clearning  Miscellaneous comments x x       PFT     Latest Ref Rng & Units 12/27/2021    9:48 AM 07/12/2021   11:46 AM 12/22/2020    2:13 PM 06/03/2020   11:27 AM 01/13/2020    8:50 AM 10/14/2019    8:49 AM 07/04/2018    8:59 AM  PFT Results  FVC-Pre L 3.83  3.94  4.03  3.95  3.95  4.07  4.12   FVC-Predicted Pre % 97  100  101  99  99  102  102   Pre FEV1/FVC % % 87  80  82  82  90  88  88   FEV1-Pre L 3.35  3.16  3.30  3.22  3.55  3.57  3.61   FEV1-Predicted Pre % 115  108  112  109  119  119  119   DLCO uncorrected ml/min/mmHg 12.42  14.62  14.65  15.03  13.56  12.16  14.04   DLCO UNC% % 52  62  62  63  57  51  58   DLCO corrected ml/min/mmHg 12.42  14.62  14.65  15.03  13.56  12.16  13.34   DLCO COR %Predicted % 52  62  62  63  57  51  55   DLVA Predicted % 61  73  74  66  65  61  64        has a past medical history of Dyspnea, Family history of colon cancer, Family history of prostate cancer, Family history of pulmonary fibrosis, Hyperlipemia, and Pneumonia.   reports that he has never smoked. He has never used smokeless tobacco.  Past Surgical History:  Procedure Laterality Date   COLONOSCOPY WITH PROPOFOL N/A 09/21/2014   Procedure: COLONOSCOPY WITH PROPOFOL;  Surgeon: Garlan Fair, MD;  Location: [facility];  Service: Endoscopy;  Laterality: N/A;   FOREARM SURGERY     excision of birth mark"child"   HERNIA REPAIR     RIH-open   KNEE ARTHROTOMY Left    bursa excision   LUNG BIOPSY Right 02/02/2016   Procedure: LUNG BIOPSY;  Surgeon: [name], MD;  Location: [facility];  Service: Thoracic;  Laterality: Right;   NASAL SEPTUM SURGERY     VIDEO ASSISTED THORACOSCOPY Right 02/02/2016   Procedure: VIDEO ASSISTED THORACOSCOPY;  Surgeon: [name], MD;  Location: [facility];  Service: Thoracic;  Laterality: Right;   WRIST GANGLION EXCISION  Left     Allergies  Allergen Reactions   Penicillins Other (See Comments)    UNSPECIFIED REACTION OF CHILDHOOD Has patient had a PCN reaction causing immediate rash, facial/tongue/throat swelling, SOB or lightheadedness with hypotension:unsure Has patient had a PCN reaction causing severe rash involving mucus membranes or skin necrosis:unsure Has patient had a PCN reaction that required hospitalization:No Has patient had a PCN reaction occurring within the last 10 years:No If all of the above answers are "NO", then may proceed with Cephalosporin use.     Immunization History  Administered Date(s) Administered   Fluad Quad(high Dose 65+) 12/02/2020, 01/03/2022   Influenza,inj,Quad PF,6+ Mos 12/09/2016, 11/26/2017, 11/29/2018, 12/31/2019   Influenza-Unspecified 11/02/2015, 12/09/2016   Moderna SARS-COV2 Booster Vaccination 01/31/2020   Moderna Sars-Covid-2 Vaccination 06/05/2019, 07/08/2019, 01/31/2020, 07/05/2020   Pneumococcal Conjugate-13 02/26/2018   Pneumococcal Polysaccharide-23 02/05/2016   Zoster Recombinat (Shingrix) 12/09/2016, 02/10/2017   Zoster, Live 11/02/2015    Family History  Problem Relation Age of Onset   Lung cancer Mother    Colon cancer Mother 57   Pulmonary fibrosis Father        dx in his 47s   Prostate cancer Brother 26   COPD Paternal Grandfather    Other Paternal Uncle        possible lung dx; died around age 48   Other Cousin        possible lung dx, died in her 79s; paternal first cousin   Healthy Son      Current Outpatient Medications:    Cholecalciferol (VITAMIN D3) 1.25 MG (50000 UT) CAPS, Take once a week for 12 weeks then once a month on the first of each month, Disp: 30 capsule, Rfl: 5   albuterol (PROVENTIL HFA;VENTOLIN HFA) 108 (90 Base) MCG/ACT inhaler, Inhale 2 puffs into the lungs every 6 (six) hours as needed for wheezing or shortness of breath., Disp: 1 Inhaler, Rfl: 5   aspirin EC 81 MG tablet, Take 81 mg by mouth daily., Disp:  , Rfl:    Chlorpheniramine-Pseudoeph 4-60 MG TABS, Take 1 tablet by mouth 2 (two) times daily as needed (for allergies.)., Disp: , Rfl:    Multiple Vitamin (MULTIVITAMIN WITH MINERALS) TABS tablet, Take 1 tablet by mouth daily., Disp: , Rfl:    Nintedanib (OFEV) 100 MG CAPS, MONTH 1: Take one capsule by mouth once daily for one week, then increase to one capsule twice daily, Disp: 53  capsule, Rfl: 0   Nintedanib (OFEV) 100 MG CAPS, Take 1 capsule (100 mg total) by mouth 2 (two) times daily. MONTH 2 and onwards, Disp: 60 capsule, Rfl: 4   simvastatin (ZOCOR) 10 MG tablet, Take 10 mg by mouth daily at 6 PM. , Disp: , Rfl: 0   Tiotropium Bromide Monohydrate (SPIRIVA RESPIMAT) 2.5 MCG/ACT AERS, Inhale 2 puffs into the lungs daily., Disp: 4 g, Rfl: 0      Objective:   There were no vitals filed for this visit.  Estimated body mass index is 22.65 kg/m as calculated from the following:   Height as of 03/21/22: '5\' 7"'$  (1.702 m).   Weight as of 03/21/22: 65.6 kg.  '@WEIGHTCHANGE'$ @  There were no vitals filed for this visit.   Physical Exam  General Appearance:    Alert, cooperative, no distress, appears stated age - *** , Deconditioned looking - *** , OBESE  - ***, Sitting on Wheelchair -  ***  Head:    Normocephalic, without obvious abnormality, atraumatic  Eyes:    PERRL, conjunctiva/corneas clear,  Ears:    Normal TM's and external ear canals, both ears  Nose:   Nares normal, septum midline, mucosa normal, no drainage    or sinus tenderness. OXYGEN ON  - *** . Patient is @ ***   Throat:   Lips, mucosa, and tongue normal; teeth and gums normal. Cyanosis on lips - ***  Neck:   Supple, symmetrical, trachea midline, no adenopathy;    thyroid:  no enlargement/tenderness/nodules; no carotid   bruit or JVD  Back:     Symmetric, no curvature, ROM normal, no CVA tenderness  Lungs:     Distress - *** , Wheeze ***, Barrell Chest - ***, Purse lip breathing - ***, Crackles - ***   Chest Wall:    No  tenderness or deformity.    Heart:    Regular rate and rhythm, S1 and S2 normal, no rub   or gallop, Murmur - ***  Breast Exam:    NOT DONE  Abdomen:     Soft, non-tender, bowel sounds active all four quadrants,    no masses, no organomegaly. Visceral obesity - ***  Genitalia:   NOT DONE  Rectal:   NOT DONE  Extremities:   Extremities - normal, Has Cane - ***, Clubbing - ***, Edema - ***  Pulses:   2+ and symmetric all extremities  Skin:   Stigmata of Connective Tissue Disease - ***  Lymph nodes:   Cervical, supraclavicular, and axillary nodes normal  Psychiatric:  Neurologic:   Pleasant - ***, Anxious - ***, Flat affect - ***  CAm-ICU - neg, Alert and Oriented x 3 - yes, Moves all 4s - yes, Speech - normal, Cognition - intact    General: No distress. *** Neuro: Alert and Oriented x 3. GCS 15. Speech normal Psych: Pleasant Resp:  Barrel Chest - ***.  Wheeze - ***, Crackles - ***, No overt respiratory distress CVS: Normal heart sounds. Murmurs - *** Ext: Stigmata of Connective Tissue Disease - *** HEENT: Normal upper airway. PEERL +. No post nasal drip        Assessment:     No diagnosis found.     Plan:     There are no Patient Instructions on file for this visit.    SIGNATURE    Dr. Brand Males, M.D., F.C.C.P,  Pulmonary and Critical Care Medicine Staff Physician, [facility] Director - Interstitial Lung Disease  Program  [place] at [place], [place], [address]  Pager: [phone], If no answer or between  15:00h - 7:00h: call [phone] Telephone: [phone]  6:31 PM 05/14/2022

## 2022-05-15 ENCOUNTER — Telehealth: Payer: Self-pay | Admitting: Internal Medicine

## 2022-05-15 ENCOUNTER — Ambulatory Visit (INDEPENDENT_AMBULATORY_CARE_PROVIDER_SITE_OTHER): Payer: Medicare Other | Admitting: Internal Medicine

## 2022-05-15 ENCOUNTER — Ambulatory Visit: Payer: Medicare Other | Admitting: Internal Medicine

## 2022-05-15 ENCOUNTER — Encounter: Payer: Self-pay | Admitting: Internal Medicine

## 2022-05-15 VITALS — BP 120/76 | HR 91 | Temp 97.6°F | Ht 66.5 in | Wt 147.2 lb

## 2022-05-15 DIAGNOSIS — Z5181 Encounter for therapeutic drug level monitoring: Secondary | ICD-10-CM | POA: Diagnosis not present

## 2022-05-15 DIAGNOSIS — J439 Emphysema, unspecified: Secondary | ICD-10-CM | POA: Diagnosis not present

## 2022-05-15 DIAGNOSIS — J84112 Idiopathic pulmonary fibrosis: Secondary | ICD-10-CM

## 2022-05-15 DIAGNOSIS — Z006 Encounter for examination for normal comparison and control in clinical research program: Secondary | ICD-10-CM

## 2022-05-15 LAB — PULMONARY FUNCTION TEST
DL/VA % pred: 57 %
DL/VA: 2.4 ml/min/mmHg/L
DLCO cor % pred: 54 %
DLCO cor: 12.96 ml/min/mmHg
DLCO unc % pred: 54 %
DLCO unc: 12.96 ml/min/mmHg
FEF 25-75 Pre: 2.51 L/sec
FEF2575-%Pred-Pre: 109 %
FEV1-%Pred-Pre: 97 %
FEV1-Pre: 2.86 L
FEV1FVC-%Pred-Pre: 104 %
FEV6-%Pred-Pre: 98 %
FEV6-Pre: 3.68 L
FEV6FVC-%Pred-Pre: 105 %
FVC-%Pred-Pre: 93 %
FVC-Pre: 3.69 L
Pre FEV1/FVC ratio: 78 %
Pre FEV6/FVC Ratio: 100 %

## 2022-05-15 LAB — HEPATIC FUNCTION PANEL
ALT: 14 U/L (ref 0–53)
AST: 22 U/L (ref 0–37)
Albumin: 4.3 g/dL (ref 3.5–5.2)
Alkaline Phosphatase: 67 U/L (ref 39–117)
Bilirubin, Direct: 0.1 mg/dL (ref 0.0–0.3)
Total Bilirubin: 0.4 mg/dL (ref 0.2–1.2)
Total Protein: 6.8 g/dL (ref 6.0–8.3)

## 2022-05-15 NOTE — Patient Instructions (Signed)
Spirometry and DLCO Performed Today.  

## 2022-05-15 NOTE — Progress Notes (Signed)
Spirometry and DLCO Performed Today.  

## 2022-05-15 NOTE — Telephone Encounter (Signed)
Shaun [name] = over the fall 2023 we went down on the antifibrotic dose because of significant diarrhea.  Since then Shaun [name] has declined significantly.  He is now having exercise hypoxemia.  Given his mixed emphysema and fibrosis and the onset of excess hypoxemia right heart catheterization is indicated with intention to treat with inhaled treprostinil.  I showed him a YouTube of the right heart cath procedure and explained the concept of inhaled treprostinil.  He is willing to undergo this.  Since he is a family member I am happy to talk to you and he was appreciative of this.  I am also referring him to Loralie Champagne and Quillian Quince [facility] to get the right heart cath done      SIGNATURE    Dr. Brand Males, M.D., F.C.C.P,  Pulmonary and Critical Care Medicine Staff Physician, [place] Director - Interstitial Lung Disease  Program  Medical Director - [facility] [place] at [place], [place], [address]   Pager: [phone], If no answer  -[place] or Try [phone] Telephone (clinical office): [phone] Telephone (research): [phone]  1:30 PM 05/15/2022

## 2022-05-16 ENCOUNTER — Other Ambulatory Visit: Payer: Self-pay | Admitting: Pulmonary Disease

## 2022-05-16 NOTE — Telephone Encounter (Signed)
Shaun [name] and Shaun [name] will likely have his RHC under research protocol PHINDER study which requires RHC to be done by Drs Aundra Dubin on [facility] < 14 days from time of consent. Some coordination required. I will keep you posted later today. Shaun [name] is there a direct number for you?  Thanks  MR

## 2022-05-17 ENCOUNTER — Encounter: Payer: Medicare Other | Admitting: Pulmonary Disease

## 2022-05-17 ENCOUNTER — Ambulatory Visit (HOSPITAL_COMMUNITY): Admission: RE | Admit: 2022-05-17 | Payer: Medicare Other | Source: Ambulatory Visit | Admitting: Internal Medicine

## 2022-05-17 ENCOUNTER — Encounter (HOSPITAL_COMMUNITY): Admission: RE | Payer: Self-pay | Source: Ambulatory Visit

## 2022-05-17 DIAGNOSIS — J84112 Idiopathic pulmonary fibrosis: Secondary | ICD-10-CM

## 2022-05-17 DIAGNOSIS — Z006 Encounter for examination for normal comparison and control in clinical research program: Secondary | ICD-10-CM

## 2022-05-17 SURGERY — RIGHT HEART CATH
Anesthesia: LOCAL

## 2022-05-17 NOTE — Research (Addendum)
[place]:  "PHINDER: Pulmonary Hypertension Screening in Patients with Interstitial Lung Disease for Earlier Detection"  Primary Endpoint  To prospectively evaluate screening strategies for pulmonary hypertension (PH) in patients with ILD in an effort to promote awareness and encourage screening for [name] in this patient population. Results from this study will be used to identify and weigh specific clinical parameters based on their prognostic significance for right heart catheterization (RHC)-confirmed PH in patients with ILD.  Study Code: GMS-PH-001 Protocol Edition: Amendment 1 dated 27 Jun 2021 Sponsor Name: [employer] Primary Investigator: Dr. Catalina Antigua Hunsucker  Protocol Version for 05/17/2022 date of 27 Jun 2021  Consent Version for 05/17/2022 date of 14 Nov 2021   Lab Manual: v1 dated 10 Jun 2021   Key Inclusion Criteria:  Patients are eligible if they have a diagnosis of ILD based on computed tomography imaging, including:  Idiopathic interstitial pneumonia, including idiopathic pulmonary fibrosis  Connective tissue disease-associated ILD with forced vital capacity (FVC) <70%  Hypersensitivity pneumonitis  Scleroderma-related ILD  Autoimmune ILD  Nonspecific interstitial pneumonia  Occupational lung disease  Combined pulmonary fibrosis and emphysema  Additionally, patients must meet a total of 2 or more criteria from at least 2 distinct categories below (ie, Category 1, Category 2, Category 3) based on the Investigator's clinical judgment, within 180 days of Screening.  Category 1: Clinical tests: a) Pulmonary function tests (PFTs)  meeting specified study guidelines for DLCO. b) Historical HRCT with specified findings consistent with increased pulmonary arterial pressure.  Category 2: Symptoms, oxygenation, exercise capacity, and brain natriuretic peptide/N-terminal pro-brain natriuretic peptide indicative of worsening status. Category 3:  Echocardiography Specified findings of increased RV systolic pressure or RV enlargement.  If a patient does not have a historical PFT, HRCT, or echocardiogram needed to assess eligibility for the study, these procedures will be performed as part of study screening.  Key Exclusion Criteria:  Patients are excluded if any of the following criteria apply:  Prior RHC with mean pulmonary artery pressure >20 mmHg  Currently on an FDA-approved pulmonary arterial hypertension medication  Diagnosed with chronic obstructive pulmonary disease  Uncontrolled or untreated sleep apnea  Pulmonary embolism within the past 3 months  History of ischemic heart disease or left-sided myocardial dysfunction within 12 months of Screening, defined as left ventricular ejection fraction <40% or pulmonary capillary wedge pressure >15 mmHg  Safety Data: Not Applicable.     Patient here to sign consent for research study Phinder: Pulmonary Hypertension Screening in Patients with ILD.  [facility] [place] [facility] Coordinator note:   This visit for [name] with DOB: [birth year] on 05/17/2022 for the above protocol is Visit/Encounter # 1  and is for purpose of research.   The consent for this encounter is under Protocol Version Amendment 1, Consent Version 14 Nov 2021 and  is currently IRB approved.   Subject expressed continued interest and consent in continuing as a study subject. Subject confirmed that there was no change in contact information (e.g. address, telephone, email). Subject thanked for participation in research and contribution to science. In this visit 05/17/2022 the subject will be evaluated by  Principal Investigator  named Dr. Virgel Bouquet. This research coordinator has verified that the above investigator is  up to date with his/her training logs.   The Subject was  informed that the PI  continues to have oversight of the subject's visits and course through relevant  discussions, reviews, and also specifically of this visit by routing of this  note to the PI.  This visit is a key visit of  screening. The PI is  available for this visit.     Signed by  Darius Bump Pool  Clinical Research Coordinator  [facility]  [place], [place] 3:22 PM 05/18/2022

## 2022-05-17 NOTE — Telephone Encounter (Signed)
He is going through Consent for [facility] study today. First step is echo (via research protocol) and then RHC based on results of echo. We will keep you posted. No reply needed  Thanks    SIGNATURE    Dr. Brand Males, M.D., F.C.C.P,  Pulmonary and Critical Care Medicine Staff Physician, [place] Director - Interstitial Lung Disease  Program  Medical Director - [facility] [place] at [place], [place], [address]   Pager: [phone], If no answer  -[place] or Try [phone] Telephone (clinical office): [phone] Telephone (research): [phone]  12:06 PM 05/17/2022

## 2022-05-18 ENCOUNTER — Other Ambulatory Visit: Payer: Self-pay | Admitting: Pulmonary Disease

## 2022-05-18 DIAGNOSIS — Z006 Encounter for examination for normal comparison and control in clinical research program: Secondary | ICD-10-CM

## 2022-05-18 DIAGNOSIS — J849 Interstitial pulmonary disease, unspecified: Secondary | ICD-10-CM

## 2022-05-19 ENCOUNTER — Other Ambulatory Visit: Payer: Self-pay | Admitting: Internal Medicine

## 2022-05-19 ENCOUNTER — Other Ambulatory Visit (HOSPITAL_COMMUNITY): Payer: Medicare Other

## 2022-05-19 DIAGNOSIS — J84112 Idiopathic pulmonary fibrosis: Secondary | ICD-10-CM

## 2022-05-19 NOTE — Telephone Encounter (Signed)
Refill sent for OFEV to [facility] (pulmonary fibrosis team): [phone]  Dose: 100 mg twice daily  Last OV: 05/15/2022 - discussed rechallenge at 125m twice daily - pt to call for furthre 1575mdose supply if he is able to tolerate again. Provider: Dr. RaChase CallerNext OV: 06/26/2022 - pt having RHC completed   LFTs on 05/15/2022 wnl  DeKnox SalivaPharmD, MPH, BCPS Clinical Pharmacist (Rheumatology and Pulmonology)

## 2022-05-29 ENCOUNTER — Other Ambulatory Visit (HOSPITAL_COMMUNITY): Payer: Medicare Other

## 2022-05-31 ENCOUNTER — Ambulatory Visit (HOSPITAL_COMMUNITY)
Admission: RE | Admit: 2022-05-31 | Discharge: 2022-05-31 | Disposition: A | Discharge status: Home / Self Care | Payer: Medicare Other | Source: Ambulatory Visit | Attending: Pulmonary Disease | Admitting: Pulmonary Disease

## 2022-05-31 DIAGNOSIS — I272 Pulmonary hypertension, unspecified: Secondary | ICD-10-CM

## 2022-05-31 DIAGNOSIS — Z006 Encounter for examination for normal comparison and control in clinical research program: Secondary | ICD-10-CM

## 2022-05-31 DIAGNOSIS — J849 Interstitial pulmonary disease, unspecified: Secondary | ICD-10-CM

## 2022-05-31 LAB — ECHOCARDIOGRAM COMPLETE
Area-P 1/2: 4.93 cm2
S' Lateral: 2.4 cm

## 2022-06-02 ENCOUNTER — Other Ambulatory Visit (HOSPITAL_COMMUNITY): Payer: Self-pay

## 2022-06-02 ENCOUNTER — Encounter (HOSPITAL_COMMUNITY): Payer: Self-pay

## 2022-06-02 ENCOUNTER — Telehealth (HOSPITAL_COMMUNITY): Payer: Self-pay

## 2022-06-02 DIAGNOSIS — Z006 Encounter for examination for normal comparison and control in clinical research program: Secondary | ICD-10-CM

## 2022-06-02 NOTE — Telephone Encounter (Signed)
Cath scheduled for 03/15

## 2022-06-02 NOTE — Telephone Encounter (Signed)
Patient called with time and date of RHC, explained the instructions to him and he understood. Letter mailed to patient also.

## 2022-06-04 ENCOUNTER — Other Ambulatory Visit: Payer: Self-pay | Admitting: Pulmonary Disease

## 2022-06-05 ENCOUNTER — Encounter: Payer: Medicare Other | Admitting: Pulmonary Disease

## 2022-06-05 DIAGNOSIS — Z006 Encounter for examination for normal comparison and control in clinical research program: Secondary | ICD-10-CM

## 2022-06-05 DIAGNOSIS — J84112 Idiopathic pulmonary fibrosis: Secondary | ICD-10-CM

## 2022-06-05 NOTE — Research (Signed)
[place]:  "PHINDER: Pulmonary Hypertension Screening in Patients with Interstitial Lung Disease for Earlier Detection"  Primary Endpoint  To prospectively evaluate screening strategies for pulmonary hypertension (PH) in patients with ILD in an effort to promote awareness and encourage screening for [place] in this patient population. Results from this study will be used to identify and weigh specific clinical parameters based on their prognostic significance for right heart catheterization (RHC)-confirmed PH in patients with ILD.  Study Code: GMS-PH-001 Protocol Edition: Amendment 1 dated 27 Jun 2021 Sponsor Name: [employer] Primary Investigator: Dr. Catalina Antigua Hunsucker  Protocol Version for 06/05/2022 date of 27 Jun 2021  Consent Version for 06/05/2022 date of 14 Nov 2021   Lab Manual: v1 dated 10 Jun 2021   Key Inclusion Criteria:  Patients are eligible if they have a diagnosis of ILD based on computed tomography imaging, including:  Idiopathic interstitial pneumonia, including idiopathic pulmonary fibrosis  Connective tissue disease-associated ILD with forced vital capacity (FVC) <70%  Hypersensitivity pneumonitis  Scleroderma-related ILD  Autoimmune ILD  Nonspecific interstitial pneumonia  Occupational lung disease  Combined pulmonary fibrosis and emphysema  Additionally, patients must meet a total of 2 or more criteria from at least 2 distinct categories below (ie, Category 1, Category 2, Category 3) based on the Investigator's clinical judgment, within 180 days of Screening.  Category 1: Clinical tests: a) Pulmonary function tests (PFTs)  meeting specified study guidelines for DLCO. b) Historical HRCT with specified findings consistent with increased pulmonary arterial pressure.  Category 2: Symptoms, oxygenation, exercise capacity, and brain natriuretic peptide/N-terminal pro-brain natriuretic peptide indicative of worsening status. Category 3: Echocardiography Specified  findings of increased RV systolic pressure or RV enlargement.  If a patient does not have a historical PFT, HRCT, or echocardiogram needed to assess eligibility for the study, these procedures will be performed as part of study screening.  Key Exclusion Criteria:  Patients are excluded if any of the following criteria apply:  Prior RHC with mean pulmonary artery pressure >20 mmHg  Currently on an FDA-approved pulmonary arterial hypertension medication  Diagnosed with chronic obstructive pulmonary disease  Uncontrolled or untreated sleep apnea  Pulmonary embolism within the past 3 months  History of ischemic heart disease or left-sided myocardial dysfunction within 12 months of Screening, defined as left ventricular ejection fraction <40% or pulmonary capillary wedge pressure >15 mmHg  Safety Data: Not Applicable.     [facility] Coordinator note:   This visit for [name] with DOB: [birth year] on 06/05/2022 for the above protocol is Visit/Encounter # 1  and is for purpose of research.   The consent for this encounter is under Protocol Version Amendment 1, Consent Version revised 14 Nov 2021 and  is currently IRB approved.   Subject expressed continued interest and consent in continuing as a study subject. Subject confirmed that there was  no change in contact information (e.g. address, telephone, email). Subject thanked for participation in research and contribution to science. In this visit 06/05/2022 the subject will be evaluated by Principal Investigator named Dr. Virgel Bouquet. This research coordinator has verified that the above investigator is up to date with his/her training logs.   The Subject was informed that the PI  continues to have oversight of the subject's visits and course through relevant discussions, reviews, and also specifically of this visit by routing of this note to the PI.  This visit is a key visit of study. The PI is   available for  this visit.    Labwork, physical exam, 6MWT completed. Patient scheduled for RHC 16 June 2022.  Please see subject binder for further information.  Signed by  [place] Coordinator  [facility]  [place], [place] 1:05 PM 06/05/2022

## 2022-06-12 ENCOUNTER — Other Ambulatory Visit: Payer: Self-pay | Admitting: Pulmonary Disease

## 2022-06-12 DIAGNOSIS — J84112 Idiopathic pulmonary fibrosis: Secondary | ICD-10-CM

## 2022-06-13 ENCOUNTER — Ambulatory Visit (INDEPENDENT_AMBULATORY_CARE_PROVIDER_SITE_OTHER): Payer: Medicare Other | Admitting: Pulmonary Disease

## 2022-06-13 DIAGNOSIS — J84112 Idiopathic pulmonary fibrosis: Secondary | ICD-10-CM

## 2022-06-13 LAB — PULMONARY FUNCTION TEST
DL/VA % pred: 60 %
DL/VA: 2.5 ml/min/mmHg/L
DLCO cor % pred: 48 %
DLCO cor: 11.57 ml/min/mmHg
DLCO unc % pred: 48 %
DLCO unc: 11.57 ml/min/mmHg
FEF 25-75 Pre: 3.81 L/sec
FEF2575-%Pred-Pre: 165 %
FEV1-%Pred-Pre: 109 %
FEV1-Pre: 3.2 L
FEV1FVC-%Pred-Pre: 114 %
FEV6-%Pred-Pre: 100 %
FEV6-Pre: 3.76 L
FEV6FVC-%Pred-Pre: 105 %
FVC-%Pred-Pre: 95 %
FVC-Pre: 3.77 L
Pre FEV1/FVC ratio: 85 %
Pre FEV6/FVC Ratio: 100 %
RV % pred: 94 %
RV: 2.08 L
TLC % pred: 86 %
TLC: 5.44 L

## 2022-06-13 NOTE — Progress Notes (Signed)
PFT done today. 

## 2022-06-14 ENCOUNTER — Other Ambulatory Visit (HOSPITAL_COMMUNITY): Payer: Self-pay | Admitting: Cardiology

## 2022-06-16 ENCOUNTER — Encounter (HOSPITAL_COMMUNITY)
Admission: RE | Disposition: A | Discharge status: Home / Self Care | Payer: Self-pay | Source: Home / Self Care | Attending: Cardiology

## 2022-06-16 ENCOUNTER — Ambulatory Visit (HOSPITAL_COMMUNITY)
Admission: RE | Admit: 2022-06-16 | Discharge: 2022-06-16 | Disposition: A | Discharge status: Home / Self Care | Payer: Medicare Other | Attending: Cardiology | Admitting: Cardiology

## 2022-06-16 ENCOUNTER — Other Ambulatory Visit: Payer: Self-pay

## 2022-06-16 DIAGNOSIS — J84112 Idiopathic pulmonary fibrosis: Secondary | ICD-10-CM | POA: Insufficient documentation

## 2022-06-16 DIAGNOSIS — I272 Pulmonary hypertension, unspecified: Secondary | ICD-10-CM | POA: Diagnosis present

## 2022-06-16 DIAGNOSIS — Z006 Encounter for examination for normal comparison and control in clinical research program: Secondary | ICD-10-CM

## 2022-06-16 HISTORY — PX: RIGHT HEART CATH: CATH118263

## 2022-06-16 LAB — CBC
HCT: 50.2 % (ref 39.0–52.0)
Hemoglobin: 17.1 g/dL — ABNORMAL HIGH (ref 13.0–17.0)
MCH: 32.9 pg (ref 26.0–34.0)
MCHC: 34.1 g/dL (ref 30.0–36.0)
MCV: 96.7 fL (ref 80.0–100.0)
Platelets: 259 10*3/uL (ref 150–400)
RBC: 5.19 MIL/uL (ref 4.22–5.81)
RDW: 13 % (ref 11.5–15.5)
WBC: 9.7 10*3/uL (ref 4.0–10.5)
nRBC: 0 % (ref 0.0–0.2)

## 2022-06-16 LAB — BASIC METABOLIC PANEL
Anion gap: 10 (ref 5–15)
BUN: 12 mg/dL (ref 8–23)
CO2: 21 mmol/L — ABNORMAL LOW (ref 22–32)
Calcium: 9 mg/dL (ref 8.9–10.3)
Chloride: 106 mmol/L (ref 98–111)
Creatinine, Ser: 0.86 mg/dL (ref 0.61–1.24)
GFR, Estimated: >60 mL/min (ref >60)
Glucose, Bld: 103 mg/dL — ABNORMAL HIGH (ref 70–99)
Potassium: 4.2 mmol/L (ref 3.5–5.1)
Sodium: 137 mmol/L (ref 135–145)

## 2022-06-16 LAB — POCT I-STAT EG7
Acid-base deficit: 2 mmol/L (ref 0.0–2.0)
Acid-base deficit: 1 mmol/L (ref 0.0–2.0)
Bicarbonate: 23.3 mmol/L (ref 20.0–28.0)
Bicarbonate: 23.1 mmol/L (ref 20.0–28.0)
Calcium, Ion: 1.28 mmol/L (ref 1.15–1.40)
Calcium, Ion: 1.26 mmol/L (ref 1.15–1.40)
HCT: 50 % (ref 39.0–52.0)
HCT: 50 % (ref 39.0–52.0)
Hemoglobin: 17 g/dL (ref 13.0–17.0)
Hemoglobin: 17 g/dL (ref 13.0–17.0)
O2 Saturation: 77 %
O2 Saturation: 79 %
Potassium: 4 mmol/L (ref 3.5–5.1)
Potassium: 4 mmol/L (ref 3.5–5.1)
Sodium: 141 mmol/L (ref 135–145)
Sodium: 141 mmol/L (ref 135–145)
TCO2: 24 mmol/L (ref 22–32)
TCO2: 24 mmol/L (ref 22–32)
pCO2, Ven: 38.9 mmHg — ABNORMAL LOW (ref 44–60)
pCO2, Ven: 37.5 mmHg — ABNORMAL LOW (ref 44–60)
pH, Ven: 7.385 (ref 7.25–7.43)
pH, Ven: 7.398 (ref 7.25–7.43)
pO2, Ven: 43 mmHg (ref 32–45)
pO2, Ven: 43 mmHg (ref 32–45)

## 2022-06-16 SURGERY — RIGHT HEART CATH
Anesthesia: LOCAL

## 2022-06-16 MED ORDER — ONDANSETRON HCL 4 MG/2ML IJ SOLN: 4.0000 mg | Freq: Four times a day (QID) | INTRAMUSCULAR | Status: DC | PRN

## 2022-06-16 MED ORDER — SODIUM CHLORIDE 0.9 % IV SOLN: 250.0000 mL | INTRAVENOUS | Status: DC | PRN

## 2022-06-16 MED ORDER — ACETAMINOPHEN 325 MG PO TABS: 650.0000 mg | ORAL_TABLET | ORAL | Status: DC | PRN

## 2022-06-16 MED ORDER — SODIUM CHLORIDE 0.9% FLUSH: 3.0000 mL | Freq: Two times a day (BID) | INTRAVENOUS | Status: DC

## 2022-06-16 MED ORDER — SODIUM CHLORIDE 0.9% FLUSH: 3.0000 mL | INTRAVENOUS | Status: DC | PRN

## 2022-06-16 MED ORDER — LIDOCAINE HCL (PF) 1 % IJ SOLN
INTRAMUSCULAR | Status: AC
  Filled 2022-06-16: qty 30

## 2022-06-16 MED ORDER — HEPARIN (PORCINE) IN NACL 1000-0.9 UT/500ML-% IV SOLN
INTRAVENOUS | Status: DC | PRN
  Administered 2022-06-16: 500 mL

## 2022-06-16 MED ORDER — LABETALOL HCL 5 MG/ML IV SOLN: 10.0000 mg | INTRAVENOUS | Status: DC | PRN

## 2022-06-16 MED ORDER — HYDRALAZINE HCL 20 MG/ML IJ SOLN: 10.0000 mg | INTRAMUSCULAR | Status: DC | PRN

## 2022-06-16 MED ORDER — SODIUM CHLORIDE 0.9 % IV SOLN: INTRAVENOUS | Status: DC

## 2022-06-16 MED ORDER — LIDOCAINE HCL (PF) 1 % IJ SOLN
INTRAMUSCULAR | Status: DC | PRN
  Administered 2022-06-16: 2 mL

## 2022-06-16 SURGICAL SUPPLY — 6 items
CATH BALLN WEDGE 5F 110CM (CATHETERS) IMPLANT
PACK CARDIAC CATHETERIZATION (CUSTOM PROCEDURE TRAY) ×1 IMPLANT
SHEATH GLIDE SLENDER 4/5FR (SHEATH) IMPLANT
TRANSDUCER W/STOPCOCK (MISCELLANEOUS) ×1 IMPLANT
TUBING ART PRESS 72  MALE/FEM (TUBING) ×1
TUBING ART PRESS 72 MALE/FEM (TUBING) IMPLANT

## 2022-06-16 NOTE — H&P (Signed)
Advanced Heart Failure Team History and Physical Note   PCP:  Mayra Neer, MD  PCP-Cardiology: None     Reason for Admission: RHC   HPI:    Patient has idiopathic pulmonary fibrosis, scheduled for RHC today to assess for pulmonary hypertension.    Review of Systems: [y] = yes, [ ]  = no   General: Weight gain [ ] ; Weight loss [ ] ; Anorexia [ ] ; Fatigue [ ] ; Fever [ ] ; Chills [ ] ; Weakness [ ]   Cardiac: Chest pain/pressure [ ] ; Resting SOB [ ] ; Exertional SOB [ ] ; Orthopnea [ ] ; Pedal Edema [ ] ; Palpitations [ ] ; Syncope [ ] ; Presyncope [ ] ; Paroxysmal nocturnal dyspnea[ ]   Pulmonary: Cough [ ] ; Wheezing[ ] ; Hemoptysis[ ] ; Sputum [ ] ; Snoring [ ]   GI: Vomiting[ ] ; Dysphagia[ ] ; Melena[ ] ; Hematochezia [ ] ; Heartburn[ ] ; Abdominal pain [ ] ; Constipation [ ] ; Diarrhea [ ] ; BRBPR [ ]   GU: Hematuria[ ] ; Dysuria [ ] ; Nocturia[ ]   Vascular: Pain in legs with walking [ ] ; Pain in feet with lying flat [ ] ; Non-healing sores [ ] ; Stroke [ ] ; TIA [ ] ; Slurred speech [ ] ;  Neuro: Headaches[ ] ; Vertigo[ ] ; Seizures[ ] ; Paresthesias[ ] ;Blurred vision [ ] ; Diplopia [ ] ; Vision changes [ ]   Ortho/Skin: Arthritis [ ] ; Joint pain [ ] ; Muscle pain [ ] ; Joint swelling [ ] ; Back Pain [ ] ; Rash [ ]   Psych: Depression[ ] ; Anxiety[ ]   Heme: Bleeding problems [ ] ; Clotting disorders [ ] ; Anemia [ ]   Endocrine: Diabetes [ ] ; Thyroid dysfunction[ ]    Home Medications Prior to Admission medications   Medication Sig Start Date End Date Taking? Authorizing Provider  Chlorpheniramine-Pseudoeph 4-60 MG TABS Take 1 tablet by mouth 2 (two) times daily as needed (for allergies.).   Yes [provider]  Cholecalciferol (VITAMIN D3) 1.25 MG (50000 UT) CAPS Take once a week for 12 weeks then once a month on the first of each month 04/17/22  Yes Brand Males, MD  Multiple Vitamin (MULTIVITAMIN WITH MINERALS) TABS tablet Take 1 tablet by mouth daily.   Yes [provider]  Nintedanib (OFEV)  100 MG CAPS MONTH 1: Take one capsule by mouth once daily for one week, then increase to one capsule twice daily 01/04/22  Yes Ramaswamy, Murali, MD  Nintedanib (OFEV) 100 MG CAPS TAKE 1 CAPSULE BY MOUTH TWICE DAILY WITH FOOD. 05/19/22  Yes Brand Males, MD  simvastatin (ZOCOR) 10 MG tablet Take 10 mg by mouth daily at 6 PM.  09/01/14  Yes [provider]  Tiotropium Bromide Monohydrate (SPIRIVA RESPIMAT) 2.5 MCG/ACT AERS Inhale 2 puffs into the lungs daily. 03/21/22  Yes Brand Males, MD    Past Medical History: Past Medical History:  Diagnosis Date   Dyspnea    Family history of colon cancer    Family history of prostate cancer    Family history of pulmonary fibrosis    Hyperlipemia    Pneumonia     Past Surgical History: Past Surgical History:  Procedure Laterality Date   COLONOSCOPY WITH PROPOFOL N/A 09/21/2014   Procedure: COLONOSCOPY WITH PROPOFOL;  Surgeon: Garlan Fair, MD;  Location: [facility];  Service: Endoscopy;  Laterality: N/A;   FOREARM SURGERY     excision of birth mark"child"   HERNIA REPAIR     RIH-open   KNEE ARTHROTOMY Left    bursa excision   LUNG BIOPSY Right 02/02/2016   Procedure: LUNG BIOPSY;  Surgeon: Revonda Standard  Roxan Hockey, MD;  Location: [facility];  Service: Thoracic;  Laterality: Right;   NASAL SEPTUM SURGERY     VIDEO ASSISTED THORACOSCOPY Right 02/02/2016   Procedure: VIDEO ASSISTED THORACOSCOPY;  Surgeon: [name], MD;  Location: [facility];  Service: Thoracic;  Laterality: Right;   WRIST GANGLION EXCISION Left     Family History:  Family History  Problem Relation Age of Onset   Lung cancer Mother    Colon cancer Mother 39   Pulmonary fibrosis Father        dx in his 77s   Prostate cancer Brother 39   COPD Paternal Grandfather    Other Paternal Uncle        possible lung dx; died around age 50   Other Cousin        possible lung dx, died in her 58s; paternal first cousin   Healthy Son     Social History: Social  History   Socioeconomic History   Marital status: Married    Spouse name: Not on file   Number of children: Not on file   Years of education: Not on file   Highest education level: Not on file  Occupational History   Not on file  Tobacco Use   Smoking status: Never   Smokeless tobacco: Never  Substance and Sexual Activity   Alcohol use: No    Alcohol/week: 0.0 standard drinks of alcohol   Drug use: No   Sexual activity: Not on file  Other Topics Concern   Not on file  Social History Narrative   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    Social Determinants of Health   Financial Resource Strain: Not on file  Food Insecurity: Not on file  Transportation Needs: Not on file  Physical Activity: Not on file  Stress: Not on file  Social Connections: Not on file    Allergies:  Allergies  Allergen Reactions   Penicillins Other (See Comments)    UNSPECIFIED REACTION OF CHILDHOOD Has patient had a PCN reaction causing immediate rash, facial/tongue/throat swelling, SOB or lightheadedness with hypotension:unsure Has patient had a PCN reaction causing severe rash involving mucus membranes or skin necrosis:unsure Has patient had a PCN reaction that required hospitalization:No Has patient had a PCN reaction occurring within the last 10 years:No If all of the above answers are "NO", then may proceed with Cephalosporin use.     Objective:    Vital Signs:   Temp:  [98.4 F (36.9 C)] 98.4 F (36.9 C) (03/15 0952) Pulse Rate:  [87] 87 (03/15 0952) Resp:  [16] 16 (03/15 0952) BP: (146)/(73) 146/73 (03/15 0952) SpO2:  [94 %] 94 % (03/15 0952) Weight:  [61.2 kg] 61.2 kg (03/15 0952)   Filed Weights   06/16/22 0952  Weight: 61.2 kg     Physical Exam     General:  Well appearing. No respiratory difficulty HEENT: Normal Neck: Supple. no JVD. Carotids 2+ bilat; no bruits. No lymphadenopathy or thyromegaly appreciated. Cor: PMI nondisplaced. Regular rate & rhythm. No rubs,  gallops or murmurs. Lungs: Clear Abdomen: Soft, nontender, nondistended. No hepatosplenomegaly. No bruits or masses. Good bowel sounds. Extremities: No cyanosis, clubbing, rash, edema Neuro: Alert & oriented x 3, cranial nerves grossly intact. moves all 4 extremities w/o difficulty. Affect pleasant.   Labs     Basic Metabolic Panel: Recent Labs  Lab 06/16/22 1014  NA 137  K 4.2  CL 106  CO2 21*  GLUCOSE 103*  BUN 12  CREATININE 0.86  CALCIUM 9.0    Liver Function Tests: No results for input(s): "AST", "ALT", "ALKPHOS", "BILITOT", "PROT", "ALBUMIN" in the last 168 hours. No results for input(s): "LIPASE", "AMYLASE" in the last 168 hours. No results for input(s): "AMMONIA" in the last 168 hours.  CBC: Recent Labs  Lab 06/16/22 1014  WBC 9.7  HGB 17.1*  HCT 50.2  MCV 96.7  PLT 259    Cardiac Enzymes: No results for input(s): "CKTOTAL", "CKMB", "CKMBINDEX", "TROPONINI" in the last 168 hours.  BNP: BNP (last 3 results) No results for input(s): "BNP" in the last 8760 hours.  ProBNP (last 3 results) No results for input(s): "PROBNP" in the last 8760 hours.   CBG: No results for input(s): "GLUCAP" in the last 168 hours.  Coagulation Studies: No results for input(s): "LABPROT", "INR" in the last 72 hours.  Imaging: No results found.   Assessment/Plan   IPF diagnosis, plan for RHC today to assess for [place] as part of [place] trial.    Loralie Champagne, MD 06/16/2022, 11:36 AM  Advanced Heart Failure Team Pager [phone] (M-F; 7a - 5p)  Please contact [facility] for night-coverage after hours (4p -7a ) and weekends on [email or web address]

## 2022-06-16 NOTE — Discharge Instructions (Signed)

## 2022-06-19 ENCOUNTER — Encounter (HOSPITAL_COMMUNITY): Payer: Self-pay | Admitting: Cardiology

## 2022-06-26 ENCOUNTER — Encounter: Payer: Self-pay | Admitting: Internal Medicine

## 2022-06-26 ENCOUNTER — Ambulatory Visit (INDEPENDENT_AMBULATORY_CARE_PROVIDER_SITE_OTHER): Payer: Medicare Other | Admitting: Internal Medicine

## 2022-06-26 VITALS — BP 126/76 | HR 76 | Temp 98.2°F | Ht 67.0 in | Wt 144.0 lb

## 2022-06-26 DIAGNOSIS — Z5181 Encounter for therapeutic drug level monitoring: Secondary | ICD-10-CM

## 2022-06-26 DIAGNOSIS — E559 Vitamin D deficiency, unspecified: Secondary | ICD-10-CM

## 2022-06-26 DIAGNOSIS — I2723 Pulmonary hypertension due to lung diseases and hypoxia: Secondary | ICD-10-CM | POA: Diagnosis not present

## 2022-06-26 DIAGNOSIS — J439 Emphysema, unspecified: Secondary | ICD-10-CM | POA: Diagnosis not present

## 2022-06-26 DIAGNOSIS — J84112 Idiopathic pulmonary fibrosis: Secondary | ICD-10-CM | POA: Diagnosis not present

## 2022-06-26 DIAGNOSIS — K521 Toxic gastroenteritis and colitis: Secondary | ICD-10-CM

## 2022-06-26 NOTE — Patient Instructions (Addendum)
  IPF (idiopathic pulmonary fibrosis) (HCC) Familial idiopathic pulmonary fibrosis (HCC) Therapeutic drug monitoring Diarrhea due to drug  - IPF definitely progressive wit time on CT . There is dramatic reductio (5-14%) in breathing test and walk test since Sept 2023 -Low-dose nintedanib is better tolerated and unable to tolerate full dose anymore Plan  -Continue ofev low-dose protocol at 100 mg twice daily -Next liver function test in -3 months -Monitor pulse ox with exertion at home -if dropping below 88% please call us   Pulmonary emphysema, unspecified emphysema type (HCC)  -You to have associated emphysema but this is the much minor component because fev1/fvc ratio always > 70 Plan - Continue Spiriva Respimat samples 2.5 mcg dose and take it 2 puffs once daily  WHO-3 pulmonary hypertension - new diagnosis 06/16/22   -It is currently mild but do meet indication for starting inhaled treprostinil.  Discussed side effect profile and benefits in detail. Starting for ILD relaterd PAH . This is NOT emphysema related PAH because fev1/fvc ratio > 70  Plan  - Start nebulized treprostinil [AKA Tyvaso]; CMA to do paperwork today 06/26/2022  Vitamin D deficiency  -: Noted that you did have intolerance to the vitamin D supplement  Plan - Avoid that specific vitamin D supplement - Check vitamin D levels today to ensure no toxicity   - Followup -6- weeks - 30 min visit; walk and ILD symptoms score at followup  - visit can be with APP

## 2022-06-26 NOTE — Progress Notes (Addendum)
OV 02/22/2016  Chief Complaint  Patient presents with   Pulmonary Consult    Pt referred by Dr.Byrum for ILD. Pt states he feels he is still recovering from the lung biopsy. Pt denies cough and CP/tightness.     2nd opinionand transfer of care for ILD/UIP - IPF from DR Byrum. Presents with his wife. They related to Dr. Stanford Breed cardiologist   67 year old male found to have crackles on physical exam and subsequently surgical lung biopsy 02/02/2016 showed UIP as read by pathologist in [place].  [employer] of chest physicians interstitial lung disease questionnaire  Symptoms: He does not cough and is not troubled by shortness of breath except with strenuous exercise. Onset is only "recently" Past medical history: Positive for chronic sinus drainage mild. Recently after surgical lung biopsy was being a few bone unintentional weight loss. Otherwise negative Personal exposure history: He smoked occasional cigarettes having grown up on her [place] farm starting at 36 including when he was age 82.  Family history: All the male members in his family who smoke developed COPD but also did not smoke to the pulmonary fibrosis especially his father and his paternal uncles. His dad was diagnosed with pulmonary fibrosis at age of 71 in 05-23-2001 and died from progressive hypoxemic respiratory failure after what sounds like an IPF flare in 2005/05/23.  Home exposure history: He is lived in the same house since 05-24-87. The home does not have a humidifier or sound or hot tub or Jacuzzi. He is not exposed to any birds. 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 Approximately 30 years of work ending 4 years ago used to cut through dry walls and was exposed to concrete dust and drywall dust and possibly asbestos. HE is to  cut through insulation safe. 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 Did not work in a minor quarry a Teacher, music on Agricultural engineer. Denies any exposure to birds of feathers  Autoimmune history: Is negative.  History of exposure to pulmonary toxic drugs: Negative   Lab work - April 2017 and November 2017 extensive autoimmune and vascular this panel is negative  - High resolution CT scan of the chest August 2017: UIP pattern that is classic - personally visualized. There is also 5 mm right lower lobe lung nodule and a 5.9 cm left-sided lower bulla [stable compared to April 2017]   Severity - Pulmonary function test 08/02/2015: Isolated reduction in diffusion capacity 17.33/61%. Otherwise FVC 4 L/93% and normal. Total lung capacity 5.9 L/91% and normal - Walking desaturation test 185 feet 3 laps on room air in the office 02/22/2016: Did not desaturate but gets symptomatic   Emotional state: He is extremely worried about the diagnosis of IPF. This is particularly so because he has a strong family history and his father died within 73 years usually was diagnosed much order at age 13. Patient is worried that he got diagnosed with the same problem and much younger age of 61 and feels that he only will have a few years to live. Multiple times during the visit he was teary-eyed   OV 07/21/2016  Chief Complaint  Patient presents with   Follow-up  Pt denies change in breathing since last OV. Pt denies cough and SOB and CP/tightness.      Follow-up idiopathic pulmonary fibrosis familial variety  He presents with his wife. Overall he is stable. 4 routine activities of daily living and mowing the yard he does not have any shortness of breath or cough. He did find the mountains recently been quite dyspneic. This is worse than June 2017 with for the diagnosis of IPF. Overall he is tolerating the Ofev just  fine except occasionally he has some GI discomfort.He is questions about research trials, exercise, pulmonary fibrosis foundation and family genetics.he is quite active and diffuse pulmonary rehabilitation is easy for him. He is willing to go to [facility] and work on aerobic strengthening and muscle conditioning  Notice that she's not had pulmonary function testing since May 2017    has a past medical history of Dyspnea; Hyperlipemia; and Pneumonia.   reports that he has never smoked. He has never used smokeless tobacco.  OV 10/25/2016  Chief Complaint  Patient presents with   Follow-up    Pt here after PFT. Pt states his breathing is unchanged. Pt c/o PND and dry cough. Pt denies f/c/s and CP/tightenss.    Follow-up mild idiopathic pulmonary fibrosis he's on Ofev since  NOv 2017  Here with his wife. Overall he is stable. His pulmonary function tests and CT scan of the chest shows continued stability for 1 year. Symptom-wise also he is stable. His dyspnea when doing mountains. He is kayaking and biking and is able to outpace his wife. During this time he does not check his pulse oximetry. He does not have one. I've advised that he can get one. He is asking about familial issues, research and also prognosis we went over all this. Of note CT scan of the chest shows also he has concomitant emphysema. He prefers to avoid medications but he is interested in taking an inhaler if it means improving his dyspnea. He is active with pulmonary fibrosis foundation patient support groupo. Of note he reports easy bruising in his forearm when he bumps into something. His wife thinks it's been there all along and is unchanged. He is wondering if that this is related to Ofev and asa. We did discuss about the bleeding risk of Ofev and the need to hold this before any procedure. Overall he tolerates Ofev just fine except for occasional diarrhea once a month. This is associated with fiber food    OV   02/26/2017  Chief Complaint  Patient presents with   Follow-up    PFT done today. Pt still taking OFEV and doing good on it. Denies any complaints with cough, SOB, or CP.    Follow-up idiopathic pulmonary fibrosis on Nintendanib since November 2017  This time his wife is not here with him.  He has now been on nintedanib for a year.  Overall he is tolerating it fine except for mild occasional diarrhea.  He continues to be very active working out in the yard and doing daily walks with a few miles.  He notices only mild dyspnea with this relieved by rest.  He does not feel his health has declined in the last 1 year.  He does have concomitant emphysema on the CT scan but not apparent on the PFTs.  I asked him to try Spiriva last time but it did not help.  He is dissatisfied with occasional albuterol use.  He did have pulmonary function test today which is documented  below.  The FVC is stable but there is a change in the DLCO but he is not feeling it.  He is on IPF registry by [facility];  he is due for a study visit today.  He is interested in further studies especially [place].  We discussed this in detail brief.  He has had genetics testing he got the result.  He is worried insurance will deny this.  He is participating in the pulmonary fibrosis foundation patient support group.  He is asking details about the March 17, 2017 local [place]   OV 05/22/2017  Chief Complaint  Patient presents with   Follow-up    PFT done today.  Pt stated he had the flu 05/05/17 and was not able to eat on 05/06/17 so he missed two OFEV doses. States he has had diarrhea a couple times since then but is now doing fine with help from immodium.  States his breathing has been doing good.      Follow-up idiopathic pulmonary fibrosis on Nintendanib since November 3728   67 year old male with idiopathic pulmonary fibrosis.  He is on stable dose of nintedanib.  He tells me earlier this month he had stomach flu and that he suddenly  had subjective sense of fever associated with diarrhea and vomiting.  This resolved in 2 days.  Since then he is lost a couple of pounds of weight.  He is worried about tolerating the nintedanib.  In addition primary function test shows a 3% decline in FVC and 2 years and a 12% decline in DLCO in 2 years.  He is now wondering if the drug is effective.  Overall other than the recent illness and occasional mild diarrhea he is tolerated off of just fine.  But he is now worried about the tolerance.  He has been advised by [facility] nursing support team about taking protein with nintedanib to offset side effects and he has questions about this.   OV 08/14/2017  Chief Complaint  Patient presents with   Follow-up    PFT done today. Pt states he has been doing good since last visit and denies any complaints.     Follow-up idiopathic pulmonary fibrosis surgical lung biopsy diagnosis February 02, 2016 on Nintendanib since November 2017  Mr. Flippen continues to do well.  At the time of last visit there was concern about weight loss and also progression in lung function.  However at that time he had a respiratory infection.  Today he comes back and tells me that he has gained 1 pound of weight.  His respiratory symptoms have resolved he is back to being at a stable mild exertional dyspnea.  He is extremely active.  Concordant with his symptom improvement his FVC has improved and his DLCO is stable.  Overall his pulmonary function test is stable since November 2018 at least with a DLCO although the DLCO has progressed since 2017.  His FVC has been stable all along since May 2017.  He is not interested in the [place] study because of the oral pill and can make his diarrhea potentially worse if he gets randomized to the drug.  He is interested in an upcoming inhaler trial of IPF.  He is active in the support group and is asking about the next meeting.  He is not on oxygen.        Ct Chest High  Resolution  Result Date: 10/25/2016 CLINICAL DATA:  Interstitial lung disease, former smoker, cough. Lung biopsy November 2017. EXAM:  CT CHEST WITHOUT CONTRAST TECHNIQUE: Multidetector CT imaging of the chest was performed following the standard protocol without intravenous contrast. High resolution imaging of the lungs, as well as inspiratory and expiratory imaging, was performed. COMPARISON:  11/23/2015 and 07/08/2015. FINDINGS: Cardiovascular: Vascular structures are unremarkable. Heart size normal. No pericardial effusion. Mediastinum/Nodes: Mediastinal lymph nodes are not enlarged by CT size criteria. Hilar regions are difficult to evaluate without IV contrast. No axillary adenopathy. Esophagus is grossly unremarkable. Lungs/Pleura: Centrilobular and paraseptal emphysema with a large bullous lesion in the left lower lobe. Post biopsy changes in the right upper, right middle and right lower lobes. Superimposed basilar predominant subpleural reticulation, ground-glass and traction bronchiectasis/bronchiolectasis, likely stable from baseline examination of 07/08/2015 when differences in slice collimation are considered. There may be scattered honeycombing. Mild subpleural nodularity along the right major fissure is unchanged from 07/08/2015, indicative of subpleural lymph nodes. No pleural fluid. Airway is unremarkable. Upper Abdomen: Visualized portions of the liver and gallbladder are unremarkable. Mild nodular thickening of both adrenal glands. Visualized portion of the right kidney is unremarkable. 2.5 cm low-attenuation lesion in the left kidney is likely a cyst. Visualized portions of the spleen, pancreas, stomach and bowel are grossly unremarkable. No upper abdominal adenopathy. Musculoskeletal: No worrisome lytic or sclerotic lesions. Degenerative changes in the spine. IMPRESSION: 1. Pulmonary parenchymal pattern of fibrosis is likely stable from 07/08/2015 when differences in slice collimation are  considered. Findings are in keeping with the pathologic diagnosis of usual interstitial pneumonitis. 2.  Emphysema (ICD10-J43.9). Electronically Signed   By: Lorin Picket M.D.   On: 10/25/2016 08:23   OV 02/26/2018  Subjective:  Patient ID: Tamela [name], male , DOB: July 08, 1955 , age 12 y.o. , MRN: [id number] , ADDRESS: [place] [address]   02/26/2018 -   Chief Complaint  Patient presents with   Follow-up    breathing well     HPI Shaun [name] 67 y.o. -follow-up familial IPF.  Last seen in May 2019.  After that in July 2019 he is screened for the electric face to inhaler study but he screen failed because he is standard of care CT scan of the chest from July 2018 was not per sponsored protocol or 2018 ATS protocol.  At this point in time he continues on nintedanib.  He is not taking proton pump inhibitor and wants to know if that is okay.  He feels he is stable.  There are no new issues.  The main thing is with nintedanib once a month he will have sporadic diarrhea.  He says he has been unable to control this but from a respiratory standpoint he feels he is stable.  There are no new medical issues.  He is up-to-date with his vaccines although immunization record review shows that he could benefit from Prevnar 13 which she has not had.  He continues to be actively engaged in the patient support group.   ROS - per HPI    OV 04/29/2018  Subjective:  Patient ID: Tamela [name], male , DOB: May 21, 1955 , age 26 y.o. , MRN: [id number] , ADDRESS: [place] [id number]   04/29/2018 -   Chief Complaint  Patient presents with   Follow-up    PFT performed today.  Pt states he is about the same since last visit.      HPI Shaun [name] 67 y.o. -returns for IPF follow-up.  He is on nintedanib.  He reports continued stability.  He  still has ongoing occasional issue with diarrhea that happened suddenly every few weeks.  It is so random.  It bothers  him and does affect his quality of life.  He takes Imodium in response.  We did discuss about taking Imodium proactively once a week.  He is now to give this a try.  He continues to be interested in pulmonary reserve study particularly face to inhaler study sponsored by [employer].  Our research department is getting ready and probably should be able to screen him in the next few to several weeks.  His last pulmonary function test was today and shows continued stability.  Last liver function test was November 2019 and was normal.  His walk test today is baseline     ROS   OV 11/29/2018  Subjective:  Patient ID: Tamela [name], male , DOB: [birth year] , age 49 y.o. , MRN: [id number] , ADDRESS: [place] [address]   11/29/2018 -   Chief Complaint  Patient presents with   IPF (idiopathic pulmonary fibrosis)    No issues, states medication is working for him.   Follow-up idiopathic pulmonary fibrosis on Nintendanib since November 2017  HPI Shaun [name] 67 y.o. -presents for follow-up of IPF.  Last visit was early in 2020.  And then the pandemic care.  He try to get down an inhaler trial of phase 2 for IPF - [place] but he screen failed because of technical issues with the spirometry.  He says overall he is doing well.  He is just following low risk activities.  He has a christening coming up for his grandson.  This will be outdoors in [place].  A total of 12 people will attend.  Everybody will be Masked.  He continues taking nintedanib and does not have any issues tolerating it.  Last liver function test was over 6 months ago.  He needs another one now.  He is also asking for a flu shot.  His symptom score and walking desaturation test showed continued stability.  OV 07/15/2019  Subjective:  Patient ID: Tamela [name], male , DOB: 1955/08/28 , age 22 y.o. , MRN: [id number] , ADDRESS: [place] [address]   07/15/2019 -   Chief Complaint  Patient  presents with   Follow-up    Follow-up idiopathic pulmonary fibrosis on Nintendanib since November 2017   HPI Shaun [name] 67 y.o. -presents for IPF follow-up.  Last seen August 2020.  This visit was supposed to have a pulmonary function test but this was not scheduled.  Patient overall feels stable.  Dyspnea is stable and minimal.  He walks on a regular basis 45 minutes.  He tolerates nintedanib but has mild symptoms.  He is asking about going to a lower dose but tells me that if the drug has been beneficial he says he can manage with the current symptoms.  He says he has had the COVID-19 vaccine at this point.  Last second dose was 1 week ago.  He is in the IPF pro registry.  He is not on any interventional trial.  He screen fell for an inhaler trial but that has been withdrawn because of safety reasons.  He was never a participant in the trial.  He is interested in other trials.  He reported that he is going to be 67 years old and will become a Medicare recipient in the next several months.  He is going to look for insurance options.  Walking desaturation test was stable.  He is okay waiting till the next visit for spirometry.     OV 01/14/2020   Subjective:  Patient ID: Tamela [name], male , DOB: [birth year], age 44 y.o. years. , MRN: [id number],  ADDRESS: [place] [address] PCP  Mayra Neer, MD Providers : Treatment Team:  Attending Provider: Brand Males, MD Patient Care Team: Mayra Neer, MD as PCP - General (Family Medicine)    Chief Complaint  Patient presents with   Follow-up    PFT performed 10/12.  Pt states he has been doing okay since last visit. States that he is still having problems with diarrhea which he believes has become worse.    IPF diagnosed in November 2017.   HPI Shaun [name] 67 y.o. -returns for follow-up of his pulmonary fibrosis IPF.  He is coming up for years since diagnosis.  Overall he reports that the  stability in his symptoms see below.  He tells me that when he goes for a 35/45-minute walk he has difficulty with inclines.  He did say this is overall stable but may be compared to a year or 2 ago it is slightly worse.  Review of his pulmonary function test shows compared to 2017 there are some decline in both FVC and DLCO and compared to early 2020 there is slight decline in DLCO although compared to most recent numbers his DLCO is better.  His weight is stable.  Overall he is tolerating the nintedanib fine but he is having diarrhea.  The diarrhea is uncomfortable but nevertheless is only 2 episodes a week.  Last visit I advised him to take Imodium once a week on a scheduled basis but he says he prefers not to take medicine and he takes it as needed.  If this is then restarting to him using Imodium twice a week.  We discussed and is willing to try once a week schedule.  The other alternative he wanted to discuss was reducing the nintedanib to 100 mg twice daily.  I cautioned against this given the fact his lungs have had only a slight progression nevertheless there is progression and with the lower dose of nintedanib may be it is less effective.  Therefore is going to try the Imodium route.  He has upcoming colonoscopy because of his diarrhea.  We advised the to stop the nintedanib 2 days before and 1-2 days after the procedure.  Similarly for dental procedure he will stop the nintedanib 2 days before and resume 1 or 2 days after the procedure.   He is interested in research protocol.  We discussed several studies.  He is not interested in the Phase 2 [place] study.  We discussed promedior IV infusion study.  He wants to think about this.  We will send in the consent form.  This upcoming study with an inhaler that is already preapproved in the market for pulmonary hypertension.  Is called treprostinil.  If we get the study then he might be interested in this.   OV 12/22/2020  Subjective:  Patient ID:  Tamela [name], male , DOB: 1955/08/08 , age 46 y.o. , MRN: [id number] , ADDRESS: [place] [id number] PCP Mayra Neer, MD Patient Care Team: Mayra Neer, MD as PCP - General (Family Medicine)  This Provider for this visit: Treatment Team:  Attending Provider: Brand Males, MD    12/22/2020 -   Chief Complaint  Patient presents with   Follow-up  PFT performed today. Pt states he is about the same since last visit. Currently has some problems with sinus drainage.    IPF diagnosed in November 2017.  Family of pulmonary fibrosis - last CT March 2020 - IPF-Pro registre HPI Shaun [name] 67 y.o. -returns for follow-up.  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  Overall he is doing stable.  He is adjusted his diet and his diarrhea with nintedanib is under much better control.  His weight is stable.  His symptom scores are stable.  Side effect from diarrhea improved.  He had a 6-minute walk test and this was normal.  He had liver function test today and it is normal.  He had questions about his prognosis.  I explained that given stability of ILD that more than likely is going to be stable in the next 1 year and beyond that it is difficult to predict.  This is based on Calculator.  We looked at his trend line with the FVC and DLCO.  His DLCO this is improved compared to last year.  Therefore overall it appears that he had a decline between 2017 and 2018 but since late 2018 he has been stable till the current time September 2022.  He is always been interested in clinical trials as a care option.  He was given the option of inhaler treprostinil study called [facility] Kitchen  He did not want to do oral drug study.  He has reviewed the consent form he is not interested in this because he has to take the inhaler 4 times each day and more than 9 times each time.  We discussed IV recombinant Pentraxin with goodphase 2 to safety  and efficacy data .  I shorten the publication images of the data on this.  I have given him that.  Also give him a detailed consent form forhis for review.  He will read and reflect on this.          07/25/2021 Follow up : IPF  Patient presents for a 47-monthfollow-up.  Patient has underlying IPF.  Patient says overall breathing is doing about the same.  He gets short of breath with heavy activities but tries to remain active every day.  Typically walks about 30 to 40 minutes every day.  Recently was able to go hiking but did notice that when he was going up an incline/he will he did get some shortness of breath and had to rest.  Patient says his weight has been stable.  Patient remains on Ofev.  Patient says his chronic diarrhea has been managed with Imodium.  Typically is only have to take it about every 3 days or so.  And only takes a half a tablet.  He feels that this has become under better control. Patient says he does check his oxygen levels at home.  Has not noticed any lower oxygen levels. PFTs done on July 12, 2021 are essentially stable.  FEV1 at 108%, ratio 80, FVC 100%, DLCO 62%.  This is very similar to his lung function testing in March 2022. Patient is talking to [facility] research team  and has been referred for  the ILD pro registry.     OV 01/03/2022  Subjective:  Patient ID: Shaun [name] male , DOB: 221-May-1957, age 67y.o. , MRN: [id number], [address]: [id number] SMayra Neer MD Patient Care Team: SMayra Neer MD as PCP -  General (Family Medicine)  This Provider for this visit: Treatment Team:  Attending Provider: Brand Males, MD    01/03/2022 -   Chief Complaint  Patient presents with   Follow-up    Review PFT's. Breathing is slightly worse. He gets winded walking up an incline. He is walking approx 2 miles per day. He would like a flu vaccine today.     HPI Shaun [name] 67 y.o. -returns for follow-up.   I personally not seen him in a year.  From a respiratory symptom standpoint he is stable as can be seen below.  His weight is also stable.  He marked mild for diarrhea but this is with Imodium.  He says he is worried about taking Imodium every other day.  His diet is actually more than what is listed.  He says this week and he had to go to [place] to see his grandchildren and he had to stop.  He says the diarrhea is unpredictable.  Therefore he is asking if he can go to the low-dose of nintedanib.  We discussed 4  options including taking a holiday and then going to the low-dose versus taking a holiday from nintedanib and going to the full dose versus itching to pirfenidone versus enrolling in a clinical trial involving due to deutrified pirfenidone.  We weighed all the options.  I personally recommended taking a holiday and then rechallenging himself with nintedanib.  We took a shared decision making to go with a low-dose.  He is aware the low-dose might not be as efficacious but given his relative stability over time he is willing to do that.  He is simply does not want to do the high-dose because of the diarrhea.  Of note he is in the IPF-Pro registry and he will participate in that today.  He will have a high-dose flu shot today as well.  Perfect that all       OV 03/21/2022  Subjective:  Patient ID: Tamela [name], male , DOB: Mar 09, 1956 , age 42 y.o. , MRN: [id number] , ADDRESS: [place] [id number] PCP Mayra Neer, MD Patient Care Team: Mayra Neer, MD as PCP - General (Family Medicine)  This Provider for this visit: Treatment Team:  Attending Provider: Brand Males, MD    03/21/2022 -   Chief Complaint  Patient presents with   Follow-up    Pt states he has been doing okay since last visit and denies any real complaints.     HPI Shaun [name] 67 y.o. -presents for follow-up.  Since his last visit around Thanksgiving 2023 picked up sinus  infection and is having a lot of clearing of the throat and sinus drainage.  He tells me that he is got constant sinus drainage that is perennial.  At the last visit because of significant diarrhea he stopped his nintedanib.  I told him to take an interim holiday and restarted on the low-dose protocol.  He is now 100 mg twice daily for the last 3-4 weeks.  His diarrhea has returned but it is nowhere as severe as the past.  He has had 2 or 3 bouts only.  He is also wondering if it is because of the recent respiratory infection that he had.  He states he is on the mend.  He did have colored sputum but right now is all clear but he does have cough more than baseline and sinus drainage more than baseline.  There is no wheezing.  He  is open to trying short course of prednisone.  He did have a CT scan of the chest that shows significant progression.  He is now noticing more difficulty doing hills when he goes for his daily walks.  Symptom score shows worsening.  He has significant emphysema and this might exclude him from some studies even though his FEV1 FVC ratio is normal or greater than 70  We also discussed [employer] DEXA bone scan study for the study of osteoporosis and osteopenia in patients with IPF.  He is willing to go through this.  I have emailed him a consent form.0 0  0 CT Chest data - HRCT 03/13/22  Narrative & Impression  CLINICAL DATA:  Follow-up IPF, interstitial lung disease, history of biopsy   EXAM: CT CHEST WITHOUT CONTRAST   TECHNIQUE: Multidetector CT imaging of the chest was performed following the standard protocol without intravenous contrast. High resolution imaging of the lungs, as well as inspiratory and expiratory imaging, was performed.   RADIATION DOSE REDUCTION: This exam was performed according to the departmental dose-optimization program which includes automated exposure control, adjustment of the mA and/or kV according to patient size and/or use of  iterative reconstruction technique.   COMPARISON:  06/10/2018, 10/24/2016, 11/23/2015, 07/08/2015   FINDINGS: Cardiovascular: Scattered aortic atherosclerosis. Normal heart size. No pericardial effusion.   Mediastinum/Nodes: Unchanged prominent mediastinal lymph nodes. Thyroid gland, trachea, and esophagus demonstrate no significant findings.   Lungs/Pleura: Severe pulmonary fibrosis in a pattern with apical to basal gradient featuring irregular peripheral interstitial opacity, septal thickening, traction bronchiectasis, and large areas of honeycombing at the lung bases. Fibrotic findings are slightly worsened in comparison to most recent examination dated 06/10/2018 and clearly worsened over a longer period of time dating back to 2017. Moderate underlying centrilobular and paraseptal emphysema. No pleural effusion or pneumothorax.   Upper Abdomen: No acute abnormality.   Musculoskeletal: No chest wall abnormality. No acute osseous findings.   IMPRESSION: 1. Severe pulmonary fibrosis in a pattern with apical to basal gradient featuring irregular peripheral interstitial opacity, septal thickening, traction bronchiectasis, and large areas of honeycombing at the lung bases. Fibrotic findings are slightly worsened in comparison to most recent examination dated 06/10/2018 and clearly worsened over a longer period of time dating back to 2017. Findings remain consistent with UIP per consensus guidelines: Diagnosis of Idiopathic Pulmonary Fibrosis: An Official ATS/ERS/JRS/ALAT Clinical Practice Guideline. [place], Iss 5, [phone], Dec 02 2016. 2. Moderate underlying emphysema. 3. Unchanged prominent mediastinal lymph nodes, likely reactive.   Aortic Atherosclerosis (ICD10-I70.0) and Emphysema (ICD10-J43.9).     Electronically Signed   By: Delanna Ahmadi M.D.   On: 03/13/2022 16:19    No results found.   OV 05/15/2022  Subjective:  Patient ID:  Tamela [name], male , DOB: May 01, 1955 , age 22 y.o. , MRN: [id number] , ADDRESS: [place] [id number] PCP Mayra Neer, MD Patient Care Team: Mayra Neer, MD as PCP - General (Family Medicine)  This Provider for this visit: Treatment Team:  Attending Provider: Brand Males, MD    05/15/2022 -   Chief Complaint  Patient presents with   Follow-up    More SOB with walking up hills.  Not using albuterol MDI.  No difference when used Spiriva    HPI Shaun [name] 67 y.o. -returns for follow-up.  Initially said he is stable but his pulmonary function test shows persistent decline since April 2023 and slight decline since September 2023.  This is consistent with progression of fibrosis on the December 2023 CT scan.  Walking desaturation test showed pulse ox dropping to 89%.  He then admitted that while doing daily walks walking up hills and all that is more difficult.  He feels that he is getting worse since the fall 2023 when he switched to low-dose nintedanib.  In fact his symptom scores are gone from a dyspnea score of 1-3 and this started around the time he switched to the low-dose nintedanib.  He is wondering if this could be the reason I did acknowledge that low-dose is less effective.  After years of being stable he started having side effects with the full dose nintedanib and therefore the switch was made.  We are quite concerned about disease progression.  The other possibility is onset of pulmonary hypertension.  Currently inhaled treprostinil is approved for WHO group 3 pulmonary hypertension related to ILD.  This is be a standard of care treatment.  Therefore to evaluate this he would need echocardiogram and/or  right heart catheterization..  Previously his BNP is negative.  We discussed getting workup done for pulmonary hypertension.  He is agreeable to this.  There are 2 options he can go through insurance protocol or try to qualify for research  protocol sponsored by [place] [employer] called [place] study.  We gave him a copy of the consent.  He later emailed me saying that he is willing to start go going to the research protocol route.  If she screen fails then we will talk to cardiology and try to arrange standard of care right heart catheterization.  If he does not have pulmonary hypertension then we would discuss clinical trials as a care option in the setting of progressive disease.        OV 3?125/24   Subjective:  Patient ID: Tamela [name], male , DOB: [birth year] , age 81 y.o. , MRN: [id number] , ADDRESS: [place] [id number] PCP Mayra Neer, MD Patient Care Team: Mayra Neer, MD as PCP - General (Family Medicine) Hunsucker, Bonna Gains, MD as Consulting Physician (Pulmonary Disease)  This Provider for this visit: Treatment Team:  Attending Provider: Brand Males, MD   Emphysma = alpha-1 MM   IPF diagnosed in November 2017.   - Family of pulmonary fibrosis - last CT March 2020  - emphysema > Fibrosis -Repeat CT scan of the chest December 2023: With progression and significant emphysema present. - IPF-Pro registre -Low-dose nintedanib since Oct 2023  Combined pulmonary fibrosis and emphysema - with fibrosis much biggter component and epysema only mild due to fev/65fc fation > 70 at all time  WHO group 3 pulmonary hypertension diagnosed 06/16/2022: Mean PAP 26, PCWP 7, PVR 3.1  07/04/2022 -   Chief Complaint  Patient presents with   Follow-up    Pt states he is feeling the same. [facility] 3/15     HPI Shaun [name] 67 y.o. -returns for follow-up to discuss his right heart catheterization.  In the interim he tried high-dose full dose nintedanib but he could not tolerate it.  Diarrhea came back.  So he is back in low-dose.  He almost quit taking nintedanib but decided to do low-dose and is tolerating this fine.  He also had right heart catheterization 06/16/2022 and it shows mild  pulmonary hypertension.  He does meet increase study criteria for inhaled treprostinil.  We went over in detail both treprostinil nebulizer and DPI.  Decided to go with nebulizer as first  choice so it is easily titratable.  Discussed side effect profile and anecdotal experience of 20% intolerance.  Discussed the benefits of improved walk distance and dyspnea and potential signal in modulation of IPF.  He is willing to try this.  He will need to do paperwork for this.  Today is a discussion only visit.  Of note he did have vitamin D deficiency and I prescribed vitamin D supplements for him.  Discussed a lot of nausea therefore he stopped it.  He is willing to get his vitamin D level rechecked avoid toxicity.     RHC 06/16/22 Hemodynamics (mmHg) RA mean 2 RV 38/4 PA 47/13, mean 26 PCWP mean 7  Oxygen saturations: PA 78% AO 94%  Cardiac Output (Fick) 6.11  Cardiac Index (Fick) 3.57 PVR 3.1 WU  0 SYMPTOM SCALE - ILD 8/28/202 0 140# 07/15/2019  01/14/2020 138# 12/22/2020 138# 01/03/2022 142# 03/21/2022 144# low dose ofev since oct- nov 2023 05/15/2022 Low dose ofev 06/26/22  O2 use Room air   ra ra ra ra ra  Shortness of Breath 0 -> 5 scale with 5 being worst (score 6 If unable to do)         At rest 0 0 0 0 0 0 0 0  Simple tasks - showers, clothes change, eating, shaving 0 0 0 0 0 0 0 0  Household (dishes, doing bed, laundry) 0 0  0 0  0 0  Shopping 0 0 0 0 0 00 0 0  Walkin sel pac 0 0 00 0 0 1 1 0  Walking up Stairs 1 1 1 1 1 2 2 2   Total (40 - 48) Dyspnea Score 1 1 1 1 1 3 3 3   How bad is your cough? 0 0 1 0 0 0 1 1  How bad is your fatigue 0 1 1 1 1 1 1  0  nausea  1 1 0 0 0 0 0  vomit  0 0 0 0 0 0 0  diarhea  1 2 1 2 1 1 1   anxity  1 1 1 1 1 1 1   depresion  1 1 0 1 1 1  x     Simple office walk - 250 feet x 3 laps 02/26/2018  04/29/2018  11/29/2018  07/15/2019  03/21/2022   05/15/2022   O2 used Room air Room air Room air ra ra ra  Number laps completed 3 3 3 3 3 3    Comments about pace Good brisk       Resting Pulse Ox/HR 100% and 74/min 100% and 86/min     100% and 76/min 100%and 79/min 99% ad HR 75 97% and Hr  Final Pulse Ox/HR 96% and 96/min 97% and 93/min 96% and 99/min 96% and 111/min 93% and HR 100 89% and HR  Desaturated </= 88% no no no no No    Desaturated <= 3% points yes Yes, 3 Yes, 4 Yes, 4 points Yes 6 point   Got Tachycardic >/= 90/min yes yes yes yes yes   Symptoms at end of test No dyspnea No dyspnea  asympt Lot of throast clearning   Miscellaneous comments x x         Modified Six Minute Walk - 05/15/22 1000     Type of O2 used  Room Air    Number of laps completed  3    Lap Pace Moderate    Resting Heartrate 98 bpm    Final Heartrate 108 bpm  Resting Pulse Ox 97 %    Desaturated to <= 3 points Yes    Desaturated to < 88% No    Became tachycardic No    Symptoms  No sx noted per patient    Was the O2 correction test done? No    comments Patient SaO2 at lap 3 89%.  Patient brought back to exam room.  after 2 minutes rest, SaO2 at 97% and heart rate 92 bpm.          CT Chest data  No results found.  PFT     Latest Ref Rng & Units 06/13/2022   10:25 AM 05/15/2022    9:10 AM 12/27/2021    9:48 AM 07/12/2021   11:46 AM 12/22/2020    2:13 PM 06/03/2020   11:27 AM 01/13/2020    8:50 AM  PFT Results  FVC-Pre L 3.77  3.69  3.83  3.94  4.03  3.95  3.95   FVC-Predicted Pre % 95  93  97  100  101  99  99   Pre FEV1/FVC % % 85  78  87  80  82  82  90   FEV1-Pre L 3.20  2.86  3.35  3.16  3.30  3.22  3.55   FEV1-Predicted Pre % 109  97  115  108  112  109  119   DLCO uncorrected ml/min/mmHg 11.57  12.96  12.42  14.62  14.65  15.03  13.56   DLCO UNC% % 48  54  52  62  62  63  57   DLCO corrected ml/min/mmHg 11.57  12.96  12.42  14.62  14.65  15.03  13.56   DLCO COR %Predicted % 48  54  52  62  62  63  57   DLVA Predicted % 60  57  61  73  74  66  65   TLC L 5.44         TLC % Predicted % 86         RV % Predicted % 94               has a past medical history of Dyspnea, Family history of colon cancer, Family history of prostate cancer, Family history of pulmonary fibrosis, Hyperlipemia, and Pneumonia.   reports that he has never smoked. He has never used smokeless tobacco.  Past Surgical History:  Procedure Laterality Date   COLONOSCOPY WITH PROPOFOL N/A 09/21/2014   Procedure: COLONOSCOPY WITH PROPOFOL;  Surgeon: Garlan Fair, MD;  Location: [facility];  Service: Endoscopy;  Laterality: N/A;   FOREARM SURGERY     excision of birth mark"child"   HERNIA REPAIR     RIH-open   KNEE ARTHROTOMY Left    bursa excision   LUNG BIOPSY Right 02/02/2016   Procedure: LUNG BIOPSY;  Surgeon: [name], MD;  Location: [facility];  Service: Thoracic;  Laterality: Right;   NASAL SEPTUM SURGERY     RIGHT HEART CATH N/A 06/16/2022   Procedure: RIGHT HEART CATH;  Surgeon: Larey Dresser, MD;  Location: [facility];  Service: Cardiovascular;  Laterality: N/A;   VIDEO ASSISTED THORACOSCOPY Right 02/02/2016   Procedure: VIDEO ASSISTED THORACOSCOPY;  Surgeon: [name], MD;  Location: [facility];  Service: Thoracic;  Laterality: Right;   WRIST GANGLION EXCISION Left     Allergies  Allergen Reactions   Penicillins Other (See Comments)    UNSPECIFIED REACTION OF CHILDHOOD Has  patient had a PCN reaction causing immediate rash, facial/tongue/throat swelling, SOB or lightheadedness with hypotension:unsure Has patient had a PCN reaction causing severe rash involving mucus membranes or skin necrosis:unsure Has patient had a PCN reaction that required hospitalization:No Has patient had a PCN reaction occurring within the last 10 years:No If all of the above answers are "NO", then may proceed with Cephalosporin use.     Immunization History  Administered Date(s) Administered   Fluad Quad(high Dose 65+) 12/02/2020, 01/03/2022   Influenza,inj,Quad PF,6+ Mos 12/09/2016, 11/26/2017, 11/29/2018, 12/31/2019    Influenza-Unspecified 11/02/2015, 12/09/2016   Moderna SARS-COV2 Booster Vaccination 01/31/2020   Moderna Sars-Covid-2 Vaccination 06/05/2019, 07/08/2019, 01/31/2020, 07/05/2020   Pneumococcal Conjugate-13 02/26/2018   Pneumococcal Polysaccharide-23 02/05/2016   Zoster Recombinat (Shingrix) 12/09/2016, 02/10/2017   Zoster, Live 11/02/2015    Family History  Problem Relation Age of Onset   Lung cancer Mother    Colon cancer Mother 1   Pulmonary fibrosis Father        dx in his 20s   Prostate cancer Brother 8   COPD Paternal Grandfather    Other Paternal Uncle        possible lung dx; died around age 38   Other Cousin        possible lung dx, died in her 72s; paternal first cousin   Healthy Son      Current Outpatient Medications:    Chlorpheniramine-Pseudoeph 4-60 MG TABS, Take 1 tablet by mouth 2 (two) times daily as needed (for allergies.)., Disp: , Rfl:    Cholecalciferol (VITAMIN D3) 1.25 MG (50000 UT) CAPS, Take once a week for 12 weeks then once a month on the first of each month, Disp: 30 capsule, Rfl: 5   Multiple Vitamin (MULTIVITAMIN WITH MINERALS) TABS tablet, Take 1 tablet by mouth daily., Disp: , Rfl:    Nintedanib (OFEV) 100 MG CAPS, MONTH 1: Take one capsule by mouth once daily for one week, then increase to one capsule twice daily, Disp: 53 capsule, Rfl: 0   Nintedanib (OFEV) 100 MG CAPS, TAKE 1 CAPSULE BY MOUTH TWICE DAILY WITH FOOD., Disp: 60 capsule, Rfl: 0   simvastatin (ZOCOR) 10 MG tablet, Take 10 mg by mouth daily at 6 PM. , Disp: , Rfl: 0   Tiotropium Bromide Monohydrate (SPIRIVA RESPIMAT) 2.5 MCG/ACT AERS, Inhale 2 puffs into the lungs daily., Disp: 4 g, Rfl: 0      Objective:   Vitals:   06/26/22 1032  BP: 126/76  Pulse: 76  Temp: 98.2 F (36.8 C)  TempSrc: Oral  SpO2: 96%  Weight: 144 lb (65.3 kg)  Height: 5\' 7"  (1.702 m)    Estimated body mass index is 22.55 kg/m as calculated from the following:   Height as of this encounter: 5\' 7"   (1.702 m).   Weight as of this encounter: 144 lb (65.3 kg).  @WEIGHTCHANGE @  [employer]   06/26/22 1032  Weight: 144 lb (65.3 kg)     Physical Exam  General: No distress. Looks well Neuro: Alert and Oriented x 3. GCS 15. Speech normal Psych: Pleasant       Assessment:       ICD-10-CM   1. IPF (idiopathic pulmonary fibrosis) ([place])  J84.112     2. Pulmonary emphysema, unspecified emphysema type ([place])  J43.9     3. WHO group 3 pulmonary arterial hypertension (H[facility])  I27.23     4. Diarrhea due to drug  K52.1     5. Therapeutic drug monitoring  Z51.81     6. Vitamin D deficiency  E55.9          Plan:     Patient Instructions   IPF (idiopathic pulmonary fibrosis) (HCC) Familial idiopathic pulmonary fibrosis ([place]) Therapeutic drug monitoring Diarrhea due to drug  - IPF definitely progressive wit time on CT . There is dramatic reductio (5-14%) in breathing test and walk test since Sept 2023 -Low-dose nintedanib is better tolerated and unable to tolerate full dose anymore Plan  -Continue ofev low-dose protocol at 100 mg twice daily -Next liver function test in -3 months -Monitor pulse ox with exertion at home -if dropping below 88% please call us   Pulmonary emphysema, unspecified emphysema type (HCC)  -You to have associated emphysema but this is the much minor component because fev1/fvc ratio always > 70 Plan - Continue Spiriva Respimat samples 2.5 mcg dose and take it 2 puffs once daily  WHO-3 pulmonary hypertension - new diagnosis 06/16/22   -It is currently mild but do meet indication for starting inhaled treprostinil.  Discussed side effect profile and benefits in detail. Starting for ILD relaterd PAH . This is NOT emphysema related PAH because fev1/fvc ratio > 70  Plan  - Start nebulized treprostinil [AKA Tyvaso]; CMA to do paperwork today 06/26/2022  Vitamin D deficiency  -: Noted that you did have intolerance to the vitamin D supplement  Plan -  Avoid that specific vitamin D supplement - Check vitamin D levels today to ensure no toxicity   - Followup -6- weeks - 30 min visit; walk and ILD symptoms score at followup  - visit can be with APP  (Level 04: Estb 30-39 min  visit type: on-site physical face to visit visit spent in total care time and counseling or/and coordination of care by this undersigned MD - Dr Brand Males. This includes one or more of the following on this same day 07/04/2022: pre-charting, chart review, note writing, documentation discussion of test results, diagnostic or treatment recommendations, prognosis, risks and benefits of management options, instructions, education, compliance or risk-factor reduction. It excludes time spent by the [place] or office staff in the care of the patient . Actual time is 30 min)   SIGNATURE    Dr. Brand Males, M.D., F.C.C.P,  Pulmonary and Critical Care Medicine Staff Physician, [place] Director - Interstitial Lung Disease  Program  Pulmonary [place] at [place], [place], [address]  Pager: [phone], If no answer or between  15:00h - 7:00h: call [phone] Telephone: [phone]  06/26/22

## 2022-06-27 ENCOUNTER — Telehealth: Payer: Self-pay

## 2022-06-27 NOTE — Telephone Encounter (Signed)
Per front desk staff, pt dropped off paperwork for Tyvaso and had additional questions about the medication. Advised to inform the patient that the pharmacist would reach out to him once she had arrived at the clinic.

## 2022-06-27 NOTE — Telephone Encounter (Signed)
I spoke with patient regarding Tyvaso. We are starting with nebulization Tyvaso device. Patient has Medicare + BCBS Supplement so as long as he meets Part B criteria for Tyvaso. We reviewed flexibility with dose adjustment with nebulization solution. Eventually we can switch him to DPI once he stable on treatment but this will go through Part D benefit (he is already in catastrophic stage d/t being on Ofev so would not have copay for Tyvaso DPI through the end of 2024). Goal breaths per session is 12 breaths. I reviewed that dose is ideally titrated by 1 breath per session weekly so may take 6-9 weeks to get to goal dose. Reviewed that dose is titrated as tolerated and may be slower for some patients depending on baseline fibrosis severity.  Referral to [facility] has been placed today. [facility] and this would help consolidate pharmacies.  [phone] Phone: [phone]  Knox Saliva, PharmD, MPH, BCPS, CPP Clinical Pharmacist (Rheumatology and Pulmonology)

## 2022-06-30 NOTE — Telephone Encounter (Signed)
Returned call to [place] regarding Tyvaso nebs. Spoke with Thurmond Butts at benefits investigation department. Per rep, they need a signed OV showing PH is more clinically relevant (?) than pulmonary emphysema. I've left message to have clinician reach back out to me regarding what is needed and if an addendum is needed from Dr. Chase Caller  Phone: [phone]  Knox Saliva, PharmD, MPH, BCPS, CPP Clinical Pharmacist (Rheumatology and Pulmonology)

## 2022-07-03 NOTE — Telephone Encounter (Signed)
Spoke with Satej at [employer]. He states that OV note on 06/26/22 states patient has pulmonary emphysema and this is an exclusion for Medicare Part B coverage. Tyvaso nebs go through Part B.   Dr. Golden Pop note from 06/26/22 needs to be addended to clearly state that patient's PH-ILD is predominant diagnosis and that it is out of proportion to emphysema. The supporting evidence based on patient's clinicals (labs, procedure results, etc) needs to be included under the addendum notes. The last page of addended OV note must be signed and dated since it will be an addendum

## 2022-07-04 NOTE — Telephone Encounter (Signed)
done

## 2022-07-05 NOTE — Telephone Encounter (Signed)
Addended chart note faxed to [facility] for further processing of Tyvaso neb referral  Fax: [phone] Phone: [phone]

## 2022-07-06 ENCOUNTER — Telehealth: Payer: Self-pay | Admitting: Internal Medicine

## 2022-07-06 ENCOUNTER — Other Ambulatory Visit: Payer: Self-pay | Admitting: Internal Medicine

## 2022-07-06 DIAGNOSIS — J84112 Idiopathic pulmonary fibrosis: Secondary | ICD-10-CM

## 2022-07-06 MED ORDER — OFEV 100 MG PO CAPS: ORAL_CAPSULE | ORAL | 1 refills | Status: DC

## 2022-07-06 NOTE — Telephone Encounter (Signed)
Refill for Ofev sent to [facility]  LFTs 06/06/22 wnl  Dose: 100mg  twice daily  Knox Saliva, PharmD, MPH, BCPS, CPP Clinical Pharmacist (Rheumatology and Pulmonology)

## 2022-07-06 NOTE — Telephone Encounter (Signed)
Patient called to inform the nurse or doctor that the pharmacy told him that his script for Ofev has expired and he needs a new script.  Please advise.  CB# [phone]

## 2022-07-14 NOTE — Telephone Encounter (Signed)
Received fax from [name], RN at [facility] stating patient received first Tyvsao neb treatment on 07/12/2022 1030 at home. Patient tolerated well. No side effects reported. Nursing team will conduct follow-up call in a week. This has been sent to media tab of pt's chart for review.  Tyvaso nebs added to med list today.  F/u with Dr. Marchelle Gearing on 08/08/22  Chesley Mires, PharmD, MPH, BCPS, CPP Clinical Pharmacist (Rheumatology and Pulmonology)

## 2022-08-08 ENCOUNTER — Ambulatory Visit (INDEPENDENT_AMBULATORY_CARE_PROVIDER_SITE_OTHER): Payer: Medicare Other | Admitting: Internal Medicine

## 2022-08-08 ENCOUNTER — Encounter: Payer: Self-pay | Admitting: Internal Medicine

## 2022-08-08 VITALS — BP 120/70 | HR 81 | Ht 67.0 in | Wt 140.6 lb

## 2022-08-08 DIAGNOSIS — F419 Anxiety disorder, unspecified: Secondary | ICD-10-CM

## 2022-08-08 DIAGNOSIS — Z5181 Encounter for therapeutic drug level monitoring: Secondary | ICD-10-CM | POA: Diagnosis not present

## 2022-08-08 DIAGNOSIS — J019 Acute sinusitis, unspecified: Secondary | ICD-10-CM

## 2022-08-08 DIAGNOSIS — J84112 Idiopathic pulmonary fibrosis: Secondary | ICD-10-CM | POA: Diagnosis not present

## 2022-08-08 DIAGNOSIS — J439 Emphysema, unspecified: Secondary | ICD-10-CM

## 2022-08-08 DIAGNOSIS — F32A Depression, unspecified: Secondary | ICD-10-CM

## 2022-08-08 DIAGNOSIS — I2723 Pulmonary hypertension due to lung diseases and hypoxia: Secondary | ICD-10-CM

## 2022-08-08 LAB — HEPATIC FUNCTION PANEL
ALT: 13 U/L (ref 0–53)
AST: 21 U/L (ref 0–37)
Albumin: 4.3 g/dL (ref 3.5–5.2)
Alkaline Phosphatase: 60 U/L (ref 39–117)
Bilirubin, Direct: 0.2 mg/dL (ref 0.0–0.3)
Total Bilirubin: 0.6 mg/dL (ref 0.2–1.2)
Total Protein: 7 g/dL (ref 6.0–8.3)

## 2022-08-08 MED ORDER — DOXYCYCLINE HYCLATE 100 MG PO TABS: 100.0000 mg | ORAL_TABLET | Freq: Two times a day (BID) | ORAL | 0 refills | Status: DC

## 2022-08-08 NOTE — Progress Notes (Signed)
OV 02/22/2016  Chief Complaint  Patient presents with   Pulmonary Consult    Pt referred by Dr.Byrum for ILD. Pt states he feels he is still recovering from the lung biopsy. Pt denies cough and CP/tightness.     2nd opinionand transfer of care for ILD/UIP - IPF from DR Byrum. Presents with his wife. They related to Dr. Stanford Breed cardiologist   67 year old male found to have crackles on physical exam and subsequently surgical lung biopsy 02/02/2016 showed UIP as read by pathologist in [place].  [employer] of chest physicians interstitial lung disease questionnaire  Symptoms: He does not cough and is not troubled by shortness of breath except with strenuous exercise. Onset is only "recently" Past medical history: Positive for chronic sinus drainage mild. Recently after surgical lung biopsy was being a few bone unintentional weight loss. Otherwise negative Personal exposure history: He smoked occasional cigarettes having grown up on her [place] farm starting at 36 including when he was age 82.  Family history: All the male members in his family who smoke developed COPD but also did not smoke to the pulmonary fibrosis especially his father and his paternal uncles. His dad was diagnosed with pulmonary fibrosis at age of 71 in 05-23-2001 and died from progressive hypoxemic respiratory failure after what sounds like an IPF flare in 2005/05/23.  Home exposure history: He is lived in the same house since 05-24-87. The home does not have a humidifier or sound or hot tub or Jacuzzi. He is not exposed to any birds. 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 Approximately 30 years of work ending 4 years ago used to cut through dry walls and was exposed to concrete dust and drywall dust and possibly asbestos. HE is to  cut through insulation safe. 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 Did not work in a minor quarry a Teacher, music on Agricultural engineer. Denies any exposure to birds of feathers  Autoimmune history: Is negative.  History of exposure to pulmonary toxic drugs: Negative   Lab work - April 2017 and November 2017 extensive autoimmune and vascular this panel is negative  - High resolution CT scan of the chest August 2017: UIP pattern that is classic - personally visualized. There is also 5 mm right lower lobe lung nodule and a 5.9 cm left-sided lower bulla [stable compared to April 2017]   Severity - Pulmonary function test 08/02/2015: Isolated reduction in diffusion capacity 17.33/61%. Otherwise FVC 4 L/93% and normal. Total lung capacity 5.9 L/91% and normal - Walking desaturation test 185 feet 3 laps on room air in the office 02/22/2016: Did not desaturate but gets symptomatic   Emotional state: He is extremely worried about the diagnosis of IPF. This is particularly so because he has a strong family history and his father died within 73 years usually was diagnosed much order at age 13. Patient is worried that he got diagnosed with the same problem and much younger age of 61 and feels that he only will have a few years to live. Multiple times during the visit he was teary-eyed   OV 07/21/2016  Chief Complaint  Patient presents with   Follow-up  Pt denies change in breathing since last OV. Pt denies cough and SOB and CP/tightness.      Follow-up idiopathic pulmonary fibrosis familial variety  He presents with his wife. Overall he is stable. 4 routine activities of daily living and mowing the yard he does not have any shortness of breath or cough. He did find the mountains recently been quite dyspneic. This is worse than June 2017 with for the diagnosis of IPF. Overall he is tolerating the Ofev just  fine except occasionally he has some GI discomfort.He is questions about research trials, exercise, pulmonary fibrosis foundation and family genetics.he is quite active and diffuse pulmonary rehabilitation is easy for him. He is willing to go to [facility] and work on aerobic strengthening and muscle conditioning  Notice that she's not had pulmonary function testing since May 2017    has a past medical history of Dyspnea; Hyperlipemia; and Pneumonia.   reports that he has never smoked. He has never used smokeless tobacco.  OV 10/25/2016  Chief Complaint  Patient presents with   Follow-up    Pt here after PFT. Pt states his breathing is unchanged. Pt c/o PND and dry cough. Pt denies f/c/s and CP/tightenss.    Follow-up mild idiopathic pulmonary fibrosis he's on Ofev since  NOv 2017  Here with his wife. Overall he is stable. His pulmonary function tests and CT scan of the chest shows continued stability for 1 year. Symptom-wise also he is stable. His dyspnea when doing mountains. He is kayaking and biking and is able to outpace his wife. During this time he does not check his pulse oximetry. He does not have one. I've advised that he can get one. He is asking about familial issues, research and also prognosis we went over all this. Of note CT scan of the chest shows also he has concomitant emphysema. He prefers to avoid medications but he is interested in taking an inhaler if it means improving his dyspnea. He is active with pulmonary fibrosis foundation patient support groupo. Of note he reports easy bruising in his forearm when he bumps into something. His wife thinks it's been there all along and is unchanged. He is wondering if that this is related to Ofev and asa. We did discuss about the bleeding risk of Ofev and the need to hold this before any procedure. Overall he tolerates Ofev just fine except for occasional diarrhea once a month. This is associated with fiber food    OV   02/26/2017  Chief Complaint  Patient presents with   Follow-up    PFT done today. Pt still taking OFEV and doing good on it. Denies any complaints with cough, SOB, or CP.    Follow-up idiopathic pulmonary fibrosis on Nintendanib since November 2017  This time his wife is not here with him.  He has now been on nintedanib for a year.  Overall he is tolerating it fine except for mild occasional diarrhea.  He continues to be very active working out in the yard and doing daily walks with a few miles.  He notices only mild dyspnea with this relieved by rest.  He does not feel his health has declined in the last 1 year.  He does have concomitant emphysema on the CT scan but not apparent on the PFTs.  I asked him to try Spiriva last time but it did not help.  He is dissatisfied with occasional albuterol use.  He did have pulmonary function test today which is documented  below.  The FVC is stable but there is a change in the DLCO but he is not feeling it.  He is on IPF registry by [facility];  he is due for a study visit today.  He is interested in further studies especially [place].  We discussed this in detail brief.  He has had genetics testing he got the result.  He is worried insurance will deny this.  He is participating in the pulmonary fibrosis foundation patient support group.  He is asking details about the March 17, 2017 local [place]   OV 05/22/2017  Chief Complaint  Patient presents with   Follow-up    PFT done today.  Pt stated he had the flu 05/05/17 and was not able to eat on 05/06/17 so he missed two OFEV doses. States he has had diarrhea a couple times since then but is now doing fine with help from immodium.  States his breathing has been doing good.      Follow-up idiopathic pulmonary fibrosis on Nintendanib since November 3728   67 year old male with idiopathic pulmonary fibrosis.  He is on stable dose of nintedanib.  He tells me earlier this month he had stomach flu and that he suddenly  had subjective sense of fever associated with diarrhea and vomiting.  This resolved in 2 days.  Since then he is lost a couple of pounds of weight.  He is worried about tolerating the nintedanib.  In addition primary function test shows a 3% decline in FVC and 2 years and a 12% decline in DLCO in 2 years.  He is now wondering if the drug is effective.  Overall other than the recent illness and occasional mild diarrhea he is tolerated off of just fine.  But he is now worried about the tolerance.  He has been advised by [facility] nursing support team about taking protein with nintedanib to offset side effects and he has questions about this.   OV 08/14/2017  Chief Complaint  Patient presents with   Follow-up    PFT done today. Pt states he has been doing good since last visit and denies any complaints.     Follow-up idiopathic pulmonary fibrosis surgical lung biopsy diagnosis February 02, 2016 on Nintendanib since November 2017  Mr. Flippen continues to do well.  At the time of last visit there was concern about weight loss and also progression in lung function.  However at that time he had a respiratory infection.  Today he comes back and tells me that he has gained 1 pound of weight.  His respiratory symptoms have resolved he is back to being at a stable mild exertional dyspnea.  He is extremely active.  Concordant with his symptom improvement his FVC has improved and his DLCO is stable.  Overall his pulmonary function test is stable since November 2018 at least with a DLCO although the DLCO has progressed since 2017.  His FVC has been stable all along since May 2017.  He is not interested in the [place] study because of the oral pill and can make his diarrhea potentially worse if he gets randomized to the drug.  He is interested in an upcoming inhaler trial of IPF.  He is active in the support group and is asking about the next meeting.  He is not on oxygen.        Ct Chest High  Resolution  Result Date: 10/25/2016 CLINICAL DATA:  Interstitial lung disease, former smoker, cough. Lung biopsy November 2017. EXAM:  CT CHEST WITHOUT CONTRAST TECHNIQUE: Multidetector CT imaging of the chest was performed following the standard protocol without intravenous contrast. High resolution imaging of the lungs, as well as inspiratory and expiratory imaging, was performed. COMPARISON:  11/23/2015 and 07/08/2015. FINDINGS: Cardiovascular: Vascular structures are unremarkable. Heart size normal. No pericardial effusion. Mediastinum/Nodes: Mediastinal lymph nodes are not enlarged by CT size criteria. Hilar regions are difficult to evaluate without IV contrast. No axillary adenopathy. Esophagus is grossly unremarkable. Lungs/Pleura: Centrilobular and paraseptal emphysema with a large bullous lesion in the left lower lobe. Post biopsy changes in the right upper, right middle and right lower lobes. Superimposed basilar predominant subpleural reticulation, ground-glass and traction bronchiectasis/bronchiolectasis, likely stable from baseline examination of 07/08/2015 when differences in slice collimation are considered. There may be scattered honeycombing. Mild subpleural nodularity along the right major fissure is unchanged from 07/08/2015, indicative of subpleural lymph nodes. No pleural fluid. Airway is unremarkable. Upper Abdomen: Visualized portions of the liver and gallbladder are unremarkable. Mild nodular thickening of both adrenal glands. Visualized portion of the right kidney is unremarkable. 2.5 cm low-attenuation lesion in the left kidney is likely a cyst. Visualized portions of the spleen, pancreas, stomach and bowel are grossly unremarkable. No upper abdominal adenopathy. Musculoskeletal: No worrisome lytic or sclerotic lesions. Degenerative changes in the spine. IMPRESSION: 1. Pulmonary parenchymal pattern of fibrosis is likely stable from 07/08/2015 when differences in slice collimation are  considered. Findings are in keeping with the pathologic diagnosis of usual interstitial pneumonitis. 2.  Emphysema (ICD10-J43.9). Electronically Signed   By: Lorin Picket M.D.   On: 10/25/2016 08:23   OV 02/26/2018  Subjective:  Patient ID: Shaun [name], male , DOB: July 08, 1955 , age 12 y.o. , MRN: [id number] , ADDRESS: [place] [address]   02/26/2018 -   Chief Complaint  Patient presents with   Follow-up    breathing well     HPI CHARAN PRIETO 67 y.o. -follow-up familial IPF.  Last seen in May 2019.  After that in July 2019 he is screened for the electric face to inhaler study but he screen failed because he is standard of care CT scan of the chest from July 2018 was not per sponsored protocol or 2018 ATS protocol.  At this point in time he continues on nintedanib.  He is not taking proton pump inhibitor and wants to know if that is okay.  He feels he is stable.  There are no new issues.  The main thing is with nintedanib once a month he will have sporadic diarrhea.  He says he has been unable to control this but from a respiratory standpoint he feels he is stable.  There are no new medical issues.  He is up-to-date with his vaccines although immunization record review shows that he could benefit from Prevnar 13 which she has not had.  He continues to be actively engaged in the patient support group.   ROS - per HPI    OV 04/29/2018  Subjective:  Patient ID: Shaun [name], male , DOB: May 21, 1955 , age 26 y.o. , MRN: [id number] , ADDRESS: [place] [id number]   04/29/2018 -   Chief Complaint  Patient presents with   Follow-up    PFT performed today.  Pt states he is about the same since last visit.      HPI JULLIEN GRANQUIST 67 y.o. -returns for IPF follow-up.  He is on nintedanib.  He reports continued stability.  He  still has ongoing occasional issue with diarrhea that happened suddenly every few weeks.  It is so random.  It bothers  him and does affect his quality of life.  He takes Imodium in response.  We did discuss about taking Imodium proactively once a week.  He is now to give this a try.  He continues to be interested in pulmonary reserve study particularly face to inhaler study sponsored by [employer].  Our research department is getting ready and probably should be able to screen him in the next few to several weeks.  His last pulmonary function test was today and shows continued stability.  Last liver function test was November 2019 and was normal.  His walk test today is baseline     ROS   OV 11/29/2018  Subjective:  Patient ID: Shaun [name], male , DOB: [birth year] , age 49 y.o. , MRN: [id number] , ADDRESS: [place] [address]   11/29/2018 -   Chief Complaint  Patient presents with   IPF (idiopathic pulmonary fibrosis)    No issues, states medication is working for him.   Follow-up idiopathic pulmonary fibrosis on Nintendanib since November 2017  HPI TEANCUM BRULE 67 y.o. -presents for follow-up of IPF.  Last visit was early in 2020.  And then the pandemic care.  He try to get down an inhaler trial of phase 2 for IPF - [place] but he screen failed because of technical issues with the spirometry.  He says overall he is doing well.  He is just following low risk activities.  He has a christening coming up for his grandson.  This will be outdoors in [place].  A total of 12 people will attend.  Everybody will be Masked.  He continues taking nintedanib and does not have any issues tolerating it.  Last liver function test was over 6 months ago.  He needs another one now.  He is also asking for a flu shot.  His symptom score and walking desaturation test showed continued stability.  OV 07/15/2019  Subjective:  Patient ID: Shaun [name], male , DOB: 1955/08/28 , age 22 y.o. , MRN: [id number] , ADDRESS: [place] [address]   07/15/2019 -   Chief Complaint  Patient  presents with   Follow-up    Follow-up idiopathic pulmonary fibrosis on Nintendanib since November 2017   HPI JORIAN WILLHOITE 67 y.o. -presents for IPF follow-up.  Last seen August 2020.  This visit was supposed to have a pulmonary function test but this was not scheduled.  Patient overall feels stable.  Dyspnea is stable and minimal.  He walks on a regular basis 45 minutes.  He tolerates nintedanib but has mild symptoms.  He is asking about going to a lower dose but tells me that if the drug has been beneficial he says he can manage with the current symptoms.  He says he has had the COVID-19 vaccine at this point.  Last second dose was 1 week ago.  He is in the IPF pro registry.  He is not on any interventional trial.  He screen fell for an inhaler trial but that has been withdrawn because of safety reasons.  He was never a participant in the trial.  He is interested in other trials.  He reported that he is going to be 67 years old and will become a Medicare recipient in the next several months.  He is going to look for insurance options.  Walking desaturation test was stable.  He is okay waiting till the next visit for spirometry.     OV 01/14/2020   Subjective:  Patient ID: Shaun [name], male , DOB: [birth year], age 44 y.o. years. , MRN: [id number],  ADDRESS: [place] [address] PCP  Mayra Neer, MD Providers : Treatment Team:  Attending Provider: Brand Males, MD Patient Care Team: Mayra Neer, MD as PCP - General (Family Medicine)    Chief Complaint  Patient presents with   Follow-up    PFT performed 10/12.  Pt states he has been doing okay since last visit. States that he is still having problems with diarrhea which he believes has become worse.    IPF diagnosed in November 2017.   HPI ORIEL RUMBOLD 67 y.o. -returns for follow-up of his pulmonary fibrosis IPF.  He is coming up for years since diagnosis.  Overall he reports that the  stability in his symptoms see below.  He tells me that when he goes for a 35/45-minute walk he has difficulty with inclines.  He did say this is overall stable but may be compared to a year or 2 ago it is slightly worse.  Review of his pulmonary function test shows compared to 2017 there are some decline in both FVC and DLCO and compared to early 2020 there is slight decline in DLCO although compared to most recent numbers his DLCO is better.  His weight is stable.  Overall he is tolerating the nintedanib fine but he is having diarrhea.  The diarrhea is uncomfortable but nevertheless is only 2 episodes a week.  Last visit I advised him to take Imodium once a week on a scheduled basis but he says he prefers not to take medicine and he takes it as needed.  If this is then restarting to him using Imodium twice a week.  We discussed and is willing to try once a week schedule.  The other alternative he wanted to discuss was reducing the nintedanib to 100 mg twice daily.  I cautioned against this given the fact his lungs have had only a slight progression nevertheless there is progression and with the lower dose of nintedanib may be it is less effective.  Therefore is going to try the Imodium route.  He has upcoming colonoscopy because of his diarrhea.  We advised the to stop the nintedanib 2 days before and 1-2 days after the procedure.  Similarly for dental procedure he will stop the nintedanib 2 days before and resume 1 or 2 days after the procedure.   He is interested in research protocol.  We discussed several studies.  He is not interested in the Phase 2 [place] study.  We discussed promedior IV infusion study.  He wants to think about this.  We will send in the consent form.  This upcoming study with an inhaler that is already preapproved in the market for pulmonary hypertension.  Is called treprostinil.  If we get the study then he might be interested in this.   OV 12/22/2020  Subjective:  Patient ID:  Shaun [name], male , DOB: 1955/08/08 , age 46 y.o. , MRN: [id number] , ADDRESS: [place] [id number] PCP Mayra Neer, MD Patient Care Team: Mayra Neer, MD as PCP - General (Family Medicine)  This Provider for this visit: Treatment Team:  Attending Provider: Brand Males, MD    12/22/2020 -   Chief Complaint  Patient presents with   Follow-up  PFT performed today. Pt states he is about the same since last visit. Currently has some problems with sinus drainage.    IPF diagnosed in November 2017.  Family of pulmonary fibrosis - last CT March 2020 - IPF-Pro registre HPI SHRAVAN SALAHUDDIN 67 y.o. -returns for follow-up.  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  Overall he is doing stable.  He is adjusted his diet and his diarrhea with nintedanib is under much better control.  His weight is stable.  His symptom scores are stable.  Side effect from diarrhea improved.  He had a 6-minute walk test and this was normal.  He had liver function test today and it is normal.  He had questions about his prognosis.  I explained that given stability of ILD that more than likely is going to be stable in the next 1 year and beyond that it is difficult to predict.  This is based on Calculator.  We looked at his trend line with the FVC and DLCO.  His DLCO this is improved compared to last year.  Therefore overall it appears that he had a decline between 2017 and 2018 but since late 2018 he has been stable till the current time September 2022.  He is always been interested in clinical trials as a care option.  He was given the option of inhaler treprostinil study called [facility].Marland Kitchen  He did not want to do oral drug study.  He has reviewed the consent form he is not interested in this because he has to take the inhaler 4 times each day and more than 9 times each time.  We discussed IV recombinant Pentraxin with goodphase 2 to safety  and efficacy data .  I shorten the publication images of the data on this.  I have given him that.  Also give him a detailed consent form forhis for review.  He will read and reflect on this.          07/25/2021 Follow up : IPF  Patient presents for a 47-monthfollow-up.  Patient has underlying IPF.  Patient says overall breathing is doing about the same.  He gets short of breath with heavy activities but tries to remain active every day.  Typically walks about 30 to 40 minutes every day.  Recently was able to go hiking but did notice that when he was going up an incline/he will he did get some shortness of breath and had to rest.  Patient says his weight has been stable.  Patient remains on Ofev.  Patient says his chronic diarrhea has been managed with Imodium.  Typically is only have to take it about every 3 days or so.  And only takes a half a tablet.  He feels that this has become under better control. Patient says he does check his oxygen levels at home.  Has not noticed any lower oxygen levels. PFTs done on July 12, 2021 are essentially stable.  FEV1 at 108%, ratio 80, FVC 100%, DLCO 62%.  This is very similar to his lung function testing in March 2022. Patient is talking to [facility] research team  and has been referred for  the ILD pro registry.     OV 01/03/2022  Subjective:  Patient ID: STamela [name] male , DOB: 221-May-1957, age 67y.o. , MRN: [id number], [address]: [id number] SMayra Neer MD Patient Care Team: SMayra Neer MD as PCP -  General (Family Medicine)  This Provider for this visit: Treatment Team:  Attending Provider: Brand Males, MD    01/03/2022 -   Chief Complaint  Patient presents with   Follow-up    Review PFT's. Breathing is slightly worse. He gets winded walking up an incline. He is walking approx 2 miles per day. He would like a flu vaccine today.     HPI ARSENY WINSCHEL 67 y.o. -returns for follow-up.   I personally not seen him in a year.  From a respiratory symptom standpoint he is stable as can be seen below.  His weight is also stable.  He marked mild for diarrhea but this is with Imodium.  He says he is worried about taking Imodium every other day.  His diet is actually more than what is listed.  He says this week and he had to go to [place] to see his grandchildren and he had to stop.  He says the diarrhea is unpredictable.  Therefore he is asking if he can go to the low-dose of nintedanib.  We discussed 4  options including taking a holiday and then going to the low-dose versus taking a holiday from nintedanib and going to the full dose versus itching to pirfenidone versus enrolling in a clinical trial involving due to deutrified pirfenidone.  We weighed all the options.  I personally recommended taking a holiday and then rechallenging himself with nintedanib.  We took a shared decision making to go with a low-dose.  He is aware the low-dose might not be as efficacious but given his relative stability over time he is willing to do that.  He is simply does not want to do the high-dose because of the diarrhea.  Of note he is in the IPF-Pro registry and he will participate in that today.  He will have a high-dose flu shot today as well.  Perfect that all       OV 03/21/2022  Subjective:  Patient ID: Shaun [name], male , DOB: Mar 09, 1956 , age 42 y.o. , MRN: [id number] , ADDRESS: [place] [address] PCP Mayra Neer, MD Patient Care Team: Mayra Neer, MD as PCP - General (Family Medicine)  This Provider for this visit: Treatment Team:  Attending Provider: Brand Males, MD    03/21/2022 -   Chief Complaint  Patient presents with   Follow-up    Pt states he has been doing okay since last visit and denies any real complaints.     HPI TAQUAN STAVROPOULOS 67 y.o. -presents for follow-up.  Since his last visit around Thanksgiving 2023 picked up sinus  infection and is having a lot of clearing of the throat and sinus drainage.  He tells me that he is got constant sinus drainage that is perennial.  At the last visit because of significant diarrhea he stopped his nintedanib.  I told him to take an interim holiday and restarted on the low-dose protocol.  He is now 100 mg twice daily for the last 3-4 weeks.  His diarrhea has returned but it is nowhere as severe as the past.  He has had 2 or 3 bouts only.  He is also wondering if it is because of the recent respiratory infection that he had.  He states he is on the mend.  He did have colored sputum but right now is all clear but he does have cough more than baseline and sinus drainage more than baseline.  There is no wheezing.  He  is open to trying short course of prednisone.  He did have a CT scan of the chest that shows significant progression.  He is now noticing more difficulty doing hills when he goes for his daily walks.  Symptom score shows worsening.  He has significant emphysema and this might exclude him from some studies even though his FEV1 FVC ratio is normal or greater than 70  We also discussed [employer] DEXA bone scan study for the study of osteoporosis and osteopenia in patients with IPF.  He is willing to go through this.  I have emailed him a consent form.0 0  0 CT Chest data - HRCT 03/13/22  Narrative & Impression  CLINICAL DATA:  Follow-up IPF, interstitial lung disease, history of biopsy   EXAM: CT CHEST WITHOUT CONTRAST   TECHNIQUE: Multidetector CT imaging of the chest was performed following the standard protocol without intravenous contrast. High resolution imaging of the lungs, as well as inspiratory and expiratory imaging, was performed.   RADIATION DOSE REDUCTION: This exam was performed according to the departmental dose-optimization program which includes automated exposure control, adjustment of the mA and/or kV according to patient size and/or use of  iterative reconstruction technique.   COMPARISON:  06/10/2018, 10/24/2016, 11/23/2015, 07/08/2015   FINDINGS: Cardiovascular: Scattered aortic atherosclerosis. Normal heart size. No pericardial effusion.   Mediastinum/Nodes: Unchanged prominent mediastinal lymph nodes. Thyroid gland, trachea, and esophagus demonstrate no significant findings.   Lungs/Pleura: Severe pulmonary fibrosis in a pattern with apical to basal gradient featuring irregular peripheral interstitial opacity, septal thickening, traction bronchiectasis, and large areas of honeycombing at the lung bases. Fibrotic findings are slightly worsened in comparison to most recent examination dated 06/10/2018 and clearly worsened over a longer period of time dating back to 2017. Moderate underlying centrilobular and paraseptal emphysema. No pleural effusion or pneumothorax.   Upper Abdomen: No acute abnormality.   Musculoskeletal: No chest wall abnormality. No acute osseous findings.   IMPRESSION: 1. Severe pulmonary fibrosis in a pattern with apical to basal gradient featuring irregular peripheral interstitial opacity, septal thickening, traction bronchiectasis, and large areas of honeycombing at the lung bases. Fibrotic findings are slightly worsened in comparison to most recent examination dated 06/10/2018 and clearly worsened over a longer period of time dating back to 2017. Findings remain consistent with UIP per consensus guidelines: Diagnosis of Idiopathic Pulmonary Fibrosis: An Official ATS/ERS/JRS/ALAT Clinical Practice Guideline. [place], Iss 5, [phone], Dec 02 2016. 2. Moderate underlying emphysema. 3. Unchanged prominent mediastinal lymph nodes, likely reactive.   Aortic Atherosclerosis (ICD10-I70.0) and Emphysema (ICD10-J43.9).     Electronically Signed   By: Delanna Ahmadi M.D.   On: 03/13/2022 16:19    No results found.   OV 05/15/2022  Subjective:  Patient ID:  Shaun [name], male , DOB: May 01, 1955 , age 22 y.o. , MRN: [id number] , ADDRESS: [place] [id number] PCP Mayra Neer, MD Patient Care Team: Mayra Neer, MD as PCP - General (Family Medicine)  This Provider for this visit: Treatment Team:  Attending Provider: Brand Males, MD    05/15/2022 -   Chief Complaint  Patient presents with   Follow-up    More SOB with walking up hills.  Not using albuterol MDI.  No difference when used Spiriva    HPI CLEVON RUBENDALL 67 y.o. -returns for follow-up.  Initially said he is stable but his pulmonary function test shows persistent decline since April 2023 and slight decline since September 2023.  This is consistent with progression of fibrosis on the December 2023 CT scan.  Walking desaturation test showed pulse ox dropping to 89%.  He then admitted that while doing daily walks walking up hills and all that is more difficult.  He feels that he is getting worse since the fall 2023 when he switched to low-dose nintedanib.  In fact his symptom scores are gone from a dyspnea score of 1-3 and this started around the time he switched to the low-dose nintedanib.  He is wondering if this could be the reason I did acknowledge that low-dose is less effective.  After years of being stable he started having side effects with the full dose nintedanib and therefore the switch was made.  We are quite concerned about disease progression.  The other possibility is onset of pulmonary hypertension.  Currently inhaled treprostinil is approved for WHO group 3 pulmonary hypertension related to ILD.  This is be a standard of care treatment.  Therefore to evaluate this he would need echocardiogram and/or  right heart catheterization..  Previously his BNP is negative.  We discussed getting workup done for pulmonary hypertension.  He is agreeable to this.  There are 2 options he can go through insurance protocol or try to qualify for research  protocol sponsored by [employer] called PHINDER study.  We gave him a copy of the consent.  He later emailed me saying that he is willing to start go going to the research protocol route.  If she screen fails then we will talk to cardiology and try to arrange standard of care right heart catheterization.  If he does not have pulmonary hypertension then we would discuss clinical trials as a care option in the setting of progressive disease.        OV 3?125/24   Subjective:  Patient ID: Baldo Ash, male , DOB: Jun 17, 1955 , age 24 y.o. , MRN: [id number] , ADDRESS: [address] [place] [address] PCP Lupita Raider, MD Patient Care Team: Lupita Raider, MD as PCP - General (Family Medicine) Hunsucker, Lesia Sago, MD as Consulting Physician (Pulmonary Disease)  This Provider for this visit: Treatment Team:  Attending Provider: Kalman Shan, MD   07/04/2022 -   Chief Complaint  Patient presents with   Follow-up    Pt states he is feeling the same. RHC 3/15     HPI CHARVIK ALLDREDGE 67 y.o. -returns for follow-up to discuss his right heart catheterization.  In the interim he tried high-dose full dose nintedanib but he could not tolerate it.  Diarrhea came back.  So he is back in low-dose.  He almost quit taking nintedanib but decided to do low-dose and is tolerating this fine.  He also had right heart catheterization 06/16/2022 and it shows mild pulmonary hypertension.  He does meet increase study criteria for inhaled treprostinil.  We went over in detail both treprostinil nebulizer and DPI.  Decided to go with nebulizer as first choice so it is easily titratable.  Discussed side effect profile and anecdotal experience of 20% intolerance.  Discussed the benefits of improved walk distance and dyspnea and potential signal in modulation of IPF.  He is willing to try this.  He will need to do paperwork for this.  Today is a discussion only visit.  Of note he did have  vitamin D deficiency and I prescribed vitamin D supplements for him.  Discussed a lot of nausea therefore he stopped it.  He is willing to get his  vitamin D level rechecked avoid toxicity.     RHC 06/16/22 Hemodynamics (mmHg) RA mean 2 RV 38/4 PA 47/13, mean 26 PCWP mean 7  Oxygen saturations: PA 78% AO 94%  Cardiac Output (Fick) 6.11  Cardiac Index (Fick) 3.57 PVR 3.1 WU  0  OV 08/08/2022  Subjective:  Patient ID: Baldo Ash, male , DOB: Apr 20, 1955 , age 53 y.o. , MRN: [id number] , ADDRESS: [address] [place] [address] PCP Lupita Raider, MD Patient Care Team: Lupita Raider, MD as PCP - General (Family Medicine) Hunsucker, Lesia Sago, MD as Consulting Physician (Pulmonary Disease)  This Provider for this visit: Treatment Team:  Attending Provider: Kalman Shan, MD    08/08/2022 -   Chief Complaint  Patient presents with   Follow-up    F/up on IPF    Emphysma = alpha-1 MM   IPF diagnosed in November 2017.   - Family of pulmonary fibrosis - last CT March 2020  - emphysema > Fibrosis -Repeat CT scan of the chest December 2023: With progression and significant emphysema present. - IPF-Pro registre -Low-dose nintedanib since Oct 2023  Combined pulmonary fibrosis and emphysema - with fibrosis much biggter component and epysema only mild due to fev/68fc fation > 70 at all time  WHO group 3 pulmonary hypertension diagnosed 06/16/2022: Mean PAP 26, PCWP 7, PVR 3.1 -> tuyvaso nebs 07/12/22   HPI EGE MAVITY 67 y.o. -returns for follow-up.  He started inhaled treprostinil nebulizer 07/12/2022.  Currently taking 4 times a day each time 6 times.  No side effect so far.  Plans to escalate.  He finds the nebulizer inconvenient and wants to switch to the DPI.  I advised him to wait another 4 to 6 weeks.  And to call us and if he is tolerating well at a minimum 9 times nebulizer each time 4 times daily then we can consider switch to DPI.  He plans to go  to [place] for his annual summer trip but has postponed this to July 2024 and wants to make this decision at least in the next 4 to 6 weeks.  Supported him on this.  He is tolerating low-dose nintedanib 100 mg twice daily.  Last liver function test February he needs another liver function test today.  He continues to Spiriva.  He feels his dyspnea is improved although the dyspnea score itself is the same.  He feels he is getting some sinusitis because of the nebulizer he still has some green discharge.  He agreed to take doxycycline.  He also reported anxiety because of his illness.  Wanted to know his life expectancy wanted to know if he could live another 10 years.  I did indicate to him that it was reasonable to expect living another 10 years with this disease.  Depends on some amount of LOC but also the benefit of therapeutics and new advances.  Did discuss transplant was an option.  Rated his current disease as moderate but with progression heading to severe.  But the progression is slow.  Appears that treprostinil is helping him because he is feeling better and he did not desaturate with exertion today [which she did last time].  He deferred referral to [facility] transplant evaluation but he said he will talk to his neighbor who has had a lung transplant.  He is reporting reactive anxiety and depression.  We discussed this and he agreed to be referred to psychology.   SYMPTOM SCALE - ILD 8/28/202 0 140#  07/15/2019  01/14/2020 138# 12/22/2020 138# 01/03/2022 142# 03/21/2022 144# low dose ofev since oct- nov 2023 05/15/2022 Low dose ofev 06/26/22 08/08/2022 Low dose ofev + tyvaso since 07/12/22  O2 use Room air   ra ra ra ra ra ra  Shortness of Breath 0 -> 5 scale with 5 being worst (score 6 If unable to do)          At rest 0 0 0 0 0 0 0 0 0  Simple tasks - showers, clothes change, eating, shaving 0 0 0 0 0 0 0 0 00  Household (dishes, doing bed, laundry) 0 0  0 0  0 0 0  Shopping 0 0 0 0 0 00  0 0 0  Walkin sel pac 0 0 00 0 0 1 1 0 1  Walking up Stairs 1 1 1 1 1 2 2 2 2   Total (40 - 48) Dyspnea Score 1 1 1 1 1 3 3 3 3   How bad is your cough? 0 0 1 0 0 0 1 1 2   How bad is your fatigue 0 1 1 1 1 1 1  0 1  nausea  1 1 0 0 0 0 0 0  vomit  0 0 0 0 0 0 0 0  diarhea  1 2 1 2 1 1 1 1   anxity  1 1 1 1 1 1 1 2   depresion  1 1 0 1 1 1  x 2     Simple office walk - 250 feet x 3 laps 02/26/2018  04/29/2018  11/29/2018  07/15/2019  03/21/2022   05/15/2022  08/08/2022 Low dose ofev and tyvaso  O2 used Room air Room air Room air ra ra ra ra  Number laps completed 3 3 3 3 3 3 3   Comments about pace Good brisk        Resting Pulse Ox/HR 100% and 74/min 100% and 86/min     100% and 76/min 100%and 79/min 99% ad HR 75 97% and Hr 98% and HR79  Final Pulse Ox/HR 96% and 96/min 97% and 93/min 96% and 99/min 96% and 111/min 93% and HR 100 89% and HR 92% and HR 98  Desaturated </= 88% no no no no No     Desaturated <= 3% points yes Yes, 3 Yes, 4 Yes, 4 points Yes 6 point  Yes 4 points  Got Tachycardic >/= 90/min yes yes yes yes yes    Symptoms at end of test No dyspnea No dyspnea  asympt Lot of throast clearning  No dyspnea. better  Miscellaneous comments x x         PFT     Latest Ref Rng & Units 06/13/2022   10:25 AM 05/15/2022    9:10 AM 12/27/2021    9:48 AM 07/12/2021   11:46 AM 12/22/2020    2:13 PM 06/03/2020   11:27 AM 01/13/2020    8:50 AM  PFT Results  FVC-Pre L 3.77  3.69  3.83  3.94  4.03  3.95  3.95   FVC-Predicted Pre % 95  93  97  100  101  99  99   Pre FEV1/FVC % % 85  78  87  80  82  82  90   FEV1-Pre L 3.20  2.86  3.35  3.16  3.30  3.22  3.55   FEV1-Predicted Pre % 109  97  115  108  112  109  119   DLCO uncorrected ml/min/mmHg 11.57  12.96  12.42  14.62  14.65  15.03  13.56   DLCO UNC% % 48  54  52  62  62  63  57   DLCO corrected ml/min/mmHg 11.57  12.96  12.42  14.62  14.65  15.03  13.56   DLCO COR %Predicted % 48  54  52  62  62  63  57   DLVA Predicted % 60  57  61   73  74  66  65   TLC L 5.44         TLC % Predicted % 86         RV % Predicted % 94              has a past medical history of Dyspnea, Family history of colon cancer, Family history of prostate cancer, Family history of pulmonary fibrosis, Hyperlipemia, and Pneumonia.   reports that he has never smoked. He has never used smokeless tobacco.  Past Surgical History:  Procedure Laterality Date   COLONOSCOPY WITH PROPOFOL N/A 09/21/2014   Procedure: COLONOSCOPY WITH PROPOFOL;  Surgeon: Charolett Bumpers, MD;  Location: [facility];  Service: Endoscopy;  Laterality: N/A;   FOREARM SURGERY     excision of birth mark"child"   HERNIA REPAIR     RIH-open   KNEE ARTHROTOMY Left    bursa excision   LUNG BIOPSY Right 02/02/2016   Procedure: LUNG BIOPSY;  Surgeon: Loreli Slot, MD;  Location: [facility] OR;  Service: Thoracic;  Laterality: Right;   NASAL SEPTUM SURGERY     RIGHT HEART CATH N/A 06/16/2022   Procedure: RIGHT HEART CATH;  Surgeon: Laurey Morale, MD;  [facility] INVASIVE CV LAB;  Service: Cardiovascular;  Laterality: N/A;   VIDEO ASSISTED THORACOSCOPY Right 02/02/2016   Procedure: VIDEO ASSISTED THORACOSCOPY;  Surgeon: Loreli Slot, MD;  Location: [facility] OR;  Service: Thoracic;  Laterality: Right;   WRIST GANGLION EXCISION Left     Allergies  Allergen Reactions   Penicillins Other (See Comments)    UNSPECIFIED REACTION OF CHILDHOOD Has patient had a PCN reaction causing immediate rash, facial/tongue/throat swelling, SOB or lightheadedness with hypotension:unsure Has patient had a PCN reaction causing severe rash involving mucus membranes or skin necrosis:unsure Has patient had a PCN reaction that required hospitalization:No Has patient had a PCN reaction occurring within the last 10 years:No If all of the above answers are "NO", then may proceed with Cephalosporin use.     Immunization History  Administered Date(s) Administered   Fluad Quad(high Dose 65+)  12/02/2020, 01/03/2022   Influenza Split 01/13/2011, 01/02/2012, 01/13/2013, 12/01/2013, 12/04/2014, 11/13/2015, 12/09/2016   Influenza,inj,Quad PF,6+ Mos 12/09/2016, 11/26/2017, 11/29/2018, 12/31/2019   Influenza-Unspecified 11/02/2015, 12/09/2016   Moderna SARS-COV2 Booster Vaccination 01/31/2020   Moderna Sars-Covid-2 Vaccination 06/05/2019, 07/08/2019, 01/31/2020, 07/05/2020   Pneumococcal Conjugate-13 02/26/2018   Pneumococcal Polysaccharide-23 02/05/2016   Td 07/12/2017   Td (Adult) 08/01/1997   Tdap 12/10/2007   Zoster Recombinat (Shingrix) 12/09/2016, 02/10/2017   Zoster, Live 05/05/2015, 11/02/2015, 12/09/2016, 02/10/2017    Family History  Problem Relation Age of Onset   Lung cancer Mother    Colon cancer Mother 51   Pulmonary fibrosis Father        dx in his 49s   Prostate cancer Brother 100   COPD Paternal Grandfather    Other Paternal Uncle        possible lung dx; died around age 45   Other Cousin  possible lung dx, died in her 23s; paternal first cousin   Healthy Son      Current Outpatient Medications:    Chlorpheniramine-Pseudoeph 4-60 MG TABS, Take 1 tablet by mouth 2 (two) times daily as needed (for allergies.)., Disp: , Rfl:    Multiple Vitamin (MULTIVITAMIN WITH MINERALS) TABS tablet, Take 1 tablet by mouth daily., Disp: , Rfl:    Nintedanib (OFEV) 100 MG CAPS, TAKE 1 CAPSULE BY MOUTH TWICE DAILY WITH FOOD., Disp: 180 capsule, Rfl: 1   simvastatin (ZOCOR) 10 MG tablet, Take 10 mg by mouth daily at 6 PM. , Disp: , Rfl: 0   Tiotropium Bromide Monohydrate (SPIRIVA RESPIMAT) 2.5 MCG/ACT AERS, Inhale 2 puffs into the lungs daily., Disp: 4 g, Rfl: 0   Treprostinil (TYVASO STARTER) 0.6 MG/ML SOLN, Inhale into the lungs 4 (four) times daily. Inhale 3 breaths per treatment session four times daily. Increase by 1 breath weekly as tolerated, Disp: , Rfl:    Cholecalciferol (VITAMIN D3) 1.25 MG (50000 UT) CAPS, Take once a week for 12 weeks then once a month on the  first of each month (Patient not taking: Reported on 08/08/2022), Disp: 30 capsule, Rfl: 5      Objective:   Vitals:   08/08/22 0951  BP: 120/70  Pulse: 81  SpO2: 96%  Weight: 140 lb 9.6 oz (63.8 kg)  Height: 5\' 7"  (1.702 m)    Estimated body mass index is 22.02 kg/m as calculated from the following:   Height as of this encounter: 5\' 7"  (1.702 m).   Weight as of this encounter: 140 lb 9.6 oz (63.8 kg).  @WEIGHTCHANGE @  [employer]   08/08/22 0951  Weight: 140 lb 9.6 oz (63.8 kg)     Physical Exam  General: No distress. Looks well.  Neuro: Alert and Oriented x 3. GCS 15. Speech normal Psych: Pleasant Resp:  Barrel Chest - no.  Wheeze - no, Crackles - yes, No overt respiratory distress CVS: Normal heart sounds. Murmurs - no Ext: Stigmata of Connective Tissue Disease - no HEENT: Normal upper airway. PEERL +. No post nasal drip        Assessment:       ICD-10-CM   1. IPF (idiopathic pulmonary fibrosis) (HCC)  J84.112     2. Pulmonary emphysema, unspecified emphysema type (HCC)  J43.9     3. WHO group 3 pulmonary arterial hypertension (HCC)  I27.23     4. Therapeutic drug monitoring  Z51.81     5. Acute sinusitis, recurrence not specified, unspecified location  J01.90          Plan:     Patient Instructions   IPF (idiopathic pulmonary fibrosis) (HCC) Familial idiopathic pulmonary fibrosis (HCC) Therapeutic drug monitoring Diarrhea due to drug  - IPF definitely progressive over time slowly -Low-dose nintedanib is better tolerated and unable to tolerate full dose anymore  Plan  -Continue ofev low-dose protocol at 100 mg twice daily - liver function test 08/08/2022 - spirometry and dlco in 12 weeks - discussed [facility] transplant eval - defer for now    Pulmonary emphysema, unspecified emphysema type (HCC)  -You to have associated emphysema but this is the much minor component because fev1/fvc ratio always > 70 - curently stable  Plan -  Continue Spiriva Respimat samples 2.5 mcg dose and take it 2 puffs once daily  WHO-3 pulmonary hypertension - new diagnosis 06/16/22   -started tyvaso 07/12/22 and as of 08/08/2022 on neb 4x/day - 6 times  each times  Plan  - Cotinue nebulized treprostinil [AKA Tyvaso]; slolwy escalate per protocol = call in 5 weeks to ask for change to DPI  Anxiety/Depression - new reactive to medical illness; mild  Plan  - refer [facility]  Acute Sinusitis  Plan  - Take doxycycline 100mg  po twice daily x 5 days; take after meals and avoid sunlight     - Followup -5- weeks- call for DPI change on tyvaso - 12 weeks - ROV Dr Marchelle Gearing - 30 min vsiit after spiro/dlco    SIGNATURE    Dr. Kalman Shan, M.D., F.C.C.P,  Pulmonary and Critical Care Medicine Staff Physician, [facility] Director - Interstitial Lung Disease  Program  Pulmonary Fibrosis [facility] [place], [place], [address]  Pager: [phone], If no answer or between  15:00h - 7:00h: call [phone] Telephone: [phone]  10:40 AM 08/08/2022

## 2022-08-08 NOTE — Patient Instructions (Addendum)
  IPF (idiopathic pulmonary fibrosis) (HCC) Familial idiopathic pulmonary fibrosis (HCC) Therapeutic drug monitoring Diarrhea due to drug  - IPF definitely progressive over time slowly -Low-dose nintedanib is better tolerated and unable to tolerate full dose anymore  Plan  -Continue ofev low-dose protocol at 100 mg twice daily - liver function test 08/08/2022 - spirometry and dlco in 12 weeks - discussed [facility] eval - defer for now    Pulmonary emphysema, unspecified emphysema type (HCC)  -You to have associated emphysema but this is the much minor component because fev1/fvc ratio always > 70 - curently stable  Plan - Continue Spiriva Respimat samples 2.5 mcg dose and take it 2 puffs once daily  WHO-3 pulmonary hypertension - new diagnosis 06/16/22   -started tyvaso 07/12/22 and as of 08/08/2022 on neb 4x/day - 6 times each times  Plan  - Cotinue nebulized treprostinil [AKA Tyvaso]; slolwy escalate per protocol = call in 5 weeks to ask for change to DPI  Anxiety/Depression - new reactive to medical illness; mild  Plan  - refer [facility]  Acute Sinusitis  Plan  - Take doxycycline 100mg  po twice daily x 5 days; take after meals and avoid sunlight     - Followup -5- weeks- call for DPI change on tyvaso - 12 weeks - ROV Dr Marchelle Gearing - 30 min vsiit after spiro/dlco

## 2022-08-10 ENCOUNTER — Telehealth: Payer: Self-pay | Admitting: Internal Medicine

## 2022-08-10 NOTE — Telephone Encounter (Signed)
Patient would like samples of Spiriva. Patient phone number is [phone].

## 2022-08-11 NOTE — Telephone Encounter (Signed)
Patient was not given samples at visit 08/08/2022.Patient out of medication. Patient phone number is [phone].

## 2022-08-11 NOTE — Telephone Encounter (Signed)
ATC X1 LVM for patient to call the office. Pt was given samples on 5/7 apt-please ask if he is out already?

## 2022-08-14 MED ORDER — SPIRIVA RESPIMAT 2.5 MCG/ACT IN AERS: 2.0000 | INHALATION_SPRAY | Freq: Every day | RESPIRATORY_TRACT | 0 refills | Status: DC

## 2022-08-14 NOTE — Telephone Encounter (Signed)
Called patient, he did not answer. Left message for patient to call back. I have samples of Spiriva at the front desk.

## 2022-08-29 ENCOUNTER — Telehealth: Payer: Self-pay | Admitting: Pharmacist

## 2022-08-29 NOTE — Telephone Encounter (Signed)
Received fax and email from [facility] nurse, [name] regarding follow-up visit. Vital signs stable. Dose is 9 breaths four times daily currently. He will titrate to 10 breaths four times daily on 08/30/22.  Nurse will follow-up in four weeks  Chesley Mires, PharmD, MPH, BCPS, CPP Clinical Pharmacist (Rheumatology and Pulmonology)

## 2022-09-12 ENCOUNTER — Ambulatory Visit (INDEPENDENT_AMBULATORY_CARE_PROVIDER_SITE_OTHER): Payer: Medicare Other | Admitting: Psychology

## 2022-09-12 DIAGNOSIS — F4322 Adjustment disorder with anxiety: Secondary | ICD-10-CM | POA: Diagnosis not present

## 2022-09-12 NOTE — Progress Notes (Signed)
[facility] Counselor Initial Adult Exam  Name: Shaun [name] Date: 09/12/2022 MRN: [id number] DOB: Sep 20, 1955 PCP: Lupita Raider, MD  Time Spent: 1:05  pm - 1:59 pm : 54 Minutes  Guardian/Payee:  self    Paperwork requested: No   Reason for Visit /Presenting Problem: Anxiety and Depression.   Mental Status Exam: Appearance:   Casual     Behavior:  Appropriate  Motor:  Normal  Speech/Language:   Clear and Coherent  Affect:  Flat  Mood:  normal  Thought process:  normal  Thought content:    WNL  Sensory/Perceptual disturbances:    WNL  Orientation:  oriented to person, place, time/date, and situation  Attention:  Good  Concentration:  Good  Memory:  WNL  Fund of knowledge:   Good  Insight:    Good  Judgment:   Good  Impulse Control:  Good   Reported Symptoms:  Anxiety.   Risk Assessment: Danger to Self:  No Self-injurious Behavior: No Danger to Others: No Duty to Warn:no Physical Aggression / Violence:No  Access to Firearms a concern: No  Gang Involvement:No  Patient / guardian was educated about steps to take if suicide or homicide risk level increases between visits: no While future psychiatric events cannot be accurately predicted, the patient does not currently require acute inpatient psychiatric care and does not currently meet [facility] involuntary commitment criteria.  Substance Abuse History: Current substance abuse: No     Caffeine: 3x coffee per day.  Tobacco: denied.  Alcohol: denied.  Substance use: denied.   Past Psychiatric History:   No previous psychological problems have been observed Outpatient Providers:Na History of Psych Hospitalization: No  Psychological Testing:  NA   Family history: Anxiety (sister),  Abuse History:  Victim of: No.,  NA    Report needed: No. Victim of Neglect:No. Perpetrator of  na   Witness / Exposure to Domestic Violence: No   Protective Services Involvement: No  Witness to [employer]  Violence:  No   Family History:  Family History  Problem Relation Age of Onset   Lung cancer Mother    Colon cancer Mother 35   Pulmonary fibrosis Father        dx in his 28s   Prostate cancer Brother 62   COPD Paternal Grandfather    Other Paternal Uncle        possible lung dx; died around age 47   Other Cousin        possible lung dx, died in her 32s; paternal first cousin   Healthy Son     Living situation: the patient lives with their spouse  Sexual Orientation: Straight  Relationship Status: married  Name of spouse / other: Windell Moulding (41).  If a parent, number of children / ages: Luisa Hart 31.   Support Systems: spouse Bible study friends, pastors.   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   Military Service: No   Educational History: Education: Engineer, maintenance (IT) - forestry degree.   Religion/Sprituality/World View: Christian.   Any cultural differences that may affect / interfere with treatment:  not applicable   Recreation/Hobbies: cycling, kayak, reading,   Stressors: Health problems    Strengths: Supportive Relationships, Friends, Church, Spirituality, and Able to Communicate Effectively  Barriers:  NA.   Legal History: Pending legal issue / charges: The patient has no significant history of legal issues. History of legal issue / charges:  na  Medical History/Surgical History: reviewed Past Medical History:  Diagnosis Date   Dyspnea    Family history of colon cancer    Family history of prostate cancer    Family history of pulmonary fibrosis    Hyperlipemia    Pneumonia     Past Surgical History:  Procedure Laterality Date   COLONOSCOPY WITH PROPOFOL N/A 09/21/2014   Procedure: COLONOSCOPY WITH PROPOFOL;  Surgeon: Charolett Bumpers, MD;  Location: [facility];  Service: Endoscopy;  Laterality: N/A;   FOREARM SURGERY     excision of birth mark"child"    HERNIA REPAIR     RIH-open   KNEE ARTHROTOMY Left    bursa excision   LUNG BIOPSY Right 02/02/2016   Procedure: LUNG BIOPSY;  Surgeon: Loreli Slot, MD;  Location: [facility];  Service: Thoracic;  Laterality: Right;   NASAL SEPTUM SURGERY     RIGHT HEART CATH N/A 06/16/2022   Procedure: RIGHT HEART CATH;  Surgeon: Laurey Morale, MD;  Location: [facility] INVASIVE CV LAB;  Service: Cardiovascular;  Laterality: N/A;   VIDEO ASSISTED THORACOSCOPY Right 02/02/2016   Procedure: VIDEO ASSISTED THORACOSCOPY;  Surgeon: Loreli Slot, MD;  Location: [facility] OR;  Service: Thoracic;  Laterality: Right;   WRIST GANGLION EXCISION Left     Medications: Current Outpatient Medications  Medication Sig Dispense Refill   Chlorpheniramine-Pseudoeph 4-60 MG TABS Take 1 tablet by mouth 2 (two) times daily as needed (for allergies.).     Cholecalciferol (VITAMIN D3) 1.25 MG (50000 UT) CAPS Take once a week for 12 weeks then once a month on the first of each month (Patient not taking: Reported on 08/08/2022) 30 capsule 5   doxycycline (VIBRA-TABS) 100 MG tablet Take 1 tablet (100 mg total) by mouth 2 (two) times daily. 14 tablet 0   Multiple Vitamin (MULTIVITAMIN WITH MINERALS) TABS tablet Take 1 tablet by mouth daily.     Nintedanib (OFEV) 100 MG CAPS TAKE 1 CAPSULE BY MOUTH TWICE DAILY WITH FOOD. 180 capsule 1   simvastatin (ZOCOR) 10 MG tablet Take 10 mg by mouth daily at 6 PM.   0   Tiotropium Bromide Monohydrate (SPIRIVA RESPIMAT) 2.5 MCG/ACT AERS Inhale 2 puffs into the lungs daily. 4 g 0   Tiotropium Bromide Monohydrate (SPIRIVA RESPIMAT) 2.5 MCG/ACT AERS Inhale 2 puffs into the lungs daily. 8 g 0   Treprostinil (TYVASO STARTER) 0.6 MG/ML SOLN Inhale into the lungs 4 (four) times daily. Inhale 3 breaths per treatment session four times daily. Increase by 1 breath weekly as tolerated     No current facility-administered medications for this visit.    Allergies  Allergen Reactions   Penicillins Other (See  Comments)    UNSPECIFIED REACTION OF CHILDHOOD Has patient had a PCN reaction causing immediate rash, facial/tongue/throat swelling, SOB or lightheadedness with hypotension:unsure Has patient had a PCN reaction causing severe rash involving mucus membranes or skin necrosis:unsure Has patient had a PCN reaction that required hospitalization:No Has patient had a PCN reaction occurring within the last 10 years:No If all of the above answers are "NO", then may proceed with Cephalosporin use.     Diagnoses:  Adjustment disorder with anxious mood  Psychiatric Treatment: No , NA  Plan of Care: Outpatient.   Narrative:  Shaun [name] participated from office with therapist and consented to treatment. We reviewed the limits of confidentiality prior to the start of the evaluation. Shaun [name] expressed understanding and agreement to proceed. He was referred for counseling by  his medical provider due to depression and anxiety. Shaun [name] noted his primary concern being the decline in his health and noted this resulted in in increased anxiety. He was dx of pulmonary fibrosis at age 68 and noted taking medication for this. He was later diagnosed with Pulmonary Hypertension and noted being on a breathing treatment 4x  day for "the rest of my life". He noted the limitations that he has experienced as a result of the medication regimen, the difficulty it posses to travel with this medication regimen and storage, and the likelihood of disease progression. He noted son lives in [place] and has two children, son 72 and daughter 2. He noted that he and his and their son and his wife having "different values" and described his son as "liberal".  He noted getting along well with his son, overall. He noted that his own mother passed when he was 32. Father remarried when Shaun [name] was 57 and Shaun [name] few up in a blended family. Step-mother is 95 but in ailing health. Brother died of stroke in Apr 29, 2021. He noted his brother  in-law passing the next day, unexpectedly. He noted his father also having the same disease and was diagnosed at age 72 lived for a few years prior to passing. Shaun [name] noted his father's health ailing shortly after being diagnosed. Shaun [name] noted his own worry about his worsening health and noted reminders of this due to his experience with his father's experience. Shaun [name] noted joining a support group for his diagnosis and noted this being helpful. He noted that his wife still works and noted this is partly out of necessity due to a lack of retirement savings. He endorsed being pessimistic and noted often catastrophizing. He noted the possible benefit of processing his mother's passing and transitions after the loss including his father's remarriage. He noted being shielded by his family from his mother's illness.  He noted his step brother having the same name and noted this causing stress. He noted this being "an identity thing". He noted that his father redirected his frustration at his step-brother towards steve. Shaun [name] would benefit from processing past events, managing symptoms, and working on bolstering coping skills. Therapist provided Shaun [name] with contact information and a follow-up was scheduled for treatment.   Delight Ovens, LCSW

## 2022-09-14 ENCOUNTER — Telehealth: Payer: Self-pay | Admitting: Pharmacist

## 2022-09-14 ENCOUNTER — Telehealth: Payer: Self-pay | Admitting: Internal Medicine

## 2022-09-14 NOTE — Telephone Encounter (Addendum)
Completed Tyvaso referral form to switch patient to DPI formulation. Forms signed by Dr. Marchelle Gearing, submission pending PA.

## 2022-09-14 NOTE — Telephone Encounter (Signed)
Spoke to patient about current Tyvaso nebulization therapy. Patient stated that he is at 9 breaths per treatment and has been stable on this for ~2 weeks. He had previously tried to go up to 10 breaths but could not tolerate that. Patient stated that Dr. Marchelle Gearing told him the # of breath goals were between 9-12 breaths per treatment.   Patient is happy to move forward with switching to DPI form of Tyvaso after being stable on current dose. Informed Shaun [name] about risk of throat irritation and asked if he is experiencing any irritation right now with nebulizer device. Patient stated that he has mild itchiness currently but this is most likely due to allergies rather than Tyvaso treatment. Shaun [name] also stated that he would be willing to switch back to nebulizer if he is unable to tolerate DPI form.   Informed patient about process to switch from nebulizer to DPI and that he should expect to hear from the pharmacy in the near future about his transition. Will start Tyvaso DPI BIV.

## 2022-09-14 NOTE — Telephone Encounter (Signed)
Patient switching from nebs to DPI formulaton of Tyvaso. Submitted a Prior Authorization request to [facility] for TYVASO DPI via CoverMyMeds. Will update once we receive a response.  Key: [id number]  Chesley Mires, PharmD, MPH, BCPS, CPP Clinical Pharmacist (Rheumatology and Pulmonology)

## 2022-09-14 NOTE — Telephone Encounter (Signed)
PT calling because he discussed w/Dr. R about changing his Tyvaso from liquid to powder. He was not sure if he should ask Pharm or Dr. So I am sending to both.  Pls call PT to advise @ [phone]

## 2022-09-18 NOTE — Telephone Encounter (Signed)
Received notification from [facility] regarding a prior authorization for TYVASO DPI. Authorization has been APPROVED from 09/16/22 until further notice. Approval letter sent to scan center.  Patient can fill through [facility]: [phone]  Authorization # [id number] Phone # [phone]  Submitted completed referral paperwork to [facility] for TYVASO along with HRCT, right heart cath results, and most recent progress note.  Fax# [phone] Phone# [phone]  Chesley Mires, PharmD, MPH, BCPS, CPP Clinical Pharmacist (Rheumatology and Pulmonology)

## 2022-09-25 NOTE — Telephone Encounter (Signed)
Received email from [name], nurse at [facility]. She had f/u with patient, and he continues to do well and is currently at 9 breaths QID.Patient is scheduled to be started on Tyvaso DPI Wednesday, 09/27/22. He will be transitioning to 48 mcg as per the referral.   Chesley Mires, PharmD, MPH, BCPS, CPP Clinical Pharmacist (Rheumatology and Pulmonology)

## 2022-09-27 ENCOUNTER — Telehealth: Payer: Self-pay | Admitting: Pharmacist

## 2022-09-27 NOTE — Telephone Encounter (Signed)
Received fax [facility] nurse Marga Melnick) regarding patient visit note completely via teleconference on 09/25/22. This was Month 4 RN visit. Patient tolerating medication well at 9 puffs four times daily using nebulization device.  Patient is titrating to Tyvaso DPI four times daily on 09/27/22. Patient is scheduled for in person visit today for DPI training  Shaun [name], PharmD, MPH, BCPS, CPP Clinical Pharmacist (Rheumatology and Pulmonology)

## 2022-10-09 ENCOUNTER — Ambulatory Visit: Payer: BLUE CROSS/BLUE SHIELD | Admitting: Psychology

## 2022-11-14 ENCOUNTER — Ambulatory Visit: Payer: Medicare Other | Admitting: Internal Medicine

## 2022-11-14 ENCOUNTER — Encounter: Payer: Self-pay | Admitting: Internal Medicine

## 2022-11-14 ENCOUNTER — Ambulatory Visit (INDEPENDENT_AMBULATORY_CARE_PROVIDER_SITE_OTHER): Payer: Medicare Other | Admitting: Internal Medicine

## 2022-11-14 VITALS — BP 120/70 | HR 88 | Ht 67.0 in | Wt 144.0 lb

## 2022-11-14 DIAGNOSIS — J439 Emphysema, unspecified: Secondary | ICD-10-CM

## 2022-11-14 DIAGNOSIS — J84112 Idiopathic pulmonary fibrosis: Secondary | ICD-10-CM | POA: Diagnosis not present

## 2022-11-14 DIAGNOSIS — I2723 Pulmonary hypertension due to lung diseases and hypoxia: Secondary | ICD-10-CM | POA: Diagnosis not present

## 2022-11-14 DIAGNOSIS — Z5181 Encounter for therapeutic drug level monitoring: Secondary | ICD-10-CM

## 2022-11-14 LAB — PULMONARY FUNCTION TEST
DL/VA % pred: 61 %
DL/VA: 2.56 ml/min/mmHg/L
DLCO cor % pred: 49 %
DLCO cor: 11.81 ml/min/mmHg
DLCO unc % pred: 49 %
DLCO unc: 11.81 ml/min/mmHg
FEF 25-75 Pre: 3.17 L/sec
FEF2575-%Pred-Pre: 134 %
FEV1-%Pred-Pre: 105 %
FEV1-Pre: 3.14 L
FEV1FVC-%Pred-Pre: 113 %
FEV6-%Pred-Pre: 98 %
FEV6-Pre: 3.74 L
FEV6FVC-%Pred-Pre: 105 %
FVC-%Pred-Pre: 92 %
FVC-Pre: 3.74 L
Pre FEV1/FVC ratio: 84 %
Pre FEV6/FVC Ratio: 100 %

## 2022-11-14 LAB — HEPATIC FUNCTION PANEL
ALT: 15 U/L (ref 0–53)
AST: 26 U/L (ref 0–37)
Albumin: 4.2 g/dL (ref 3.5–5.2)
Alkaline Phosphatase: 65 U/L (ref 39–117)
Bilirubin, Direct: 0.1 mg/dL (ref 0.0–0.3)
Total Bilirubin: 0.5 mg/dL (ref 0.2–1.2)
Total Protein: 6.8 g/dL (ref 6.0–8.3)

## 2022-11-14 MED ORDER — SPIRIVA RESPIMAT 2.5 MCG/ACT IN AERS: 2.0000 | INHALATION_SPRAY | Freq: Every day | RESPIRATORY_TRACT | Status: DC

## 2022-11-14 NOTE — Patient Instructions (Addendum)
  IPF (idiopathic pulmonary fibrosis) (HCC) Familial idiopathic pulmonary fibrosis (HCC) Therapeutic drug monitoring Diarrhea due to drug  - IPF definitely progressive over time slowly but has stabilized [id number] -> August 2024 after adding tyvaso to regimen -Low-dose nintedanib is better tolerated and unable to tolerate full dose anymore -  On Tyvaso since April 2024 at QID DPI Dosing  Plan  -Continue ofev low-dose protocol at 100 mg twice daily - continue tyvaso dpi as before - liver function test 11/14/2022 - spirometry and dlco in 16 weeks - 20 weeks - discussed [facility] transplant eval - defer for now    Pulmonary emphysema, unspecified emphysema type (HCC)  -You to have associated emphysema but this is the much minor component because fev1/fvc ratio always > 70 - curently stable  Plan - Continue Spiriva Respimat samples 2.5 mcg dose and take it 2 puffs once daily  WHO-3 pulmonary hypertension - new diagnosis 06/16/22; started tyvaso 07/12/22  Doing well  Plan  - Cotinue  treprostinil [AKA Tyvaso] DPI;  Anxiety/Depression - new reactive to medical illness; mild  - improved after seeing psychologist  Plan  - supportive care     - Followup - 16-20  weeks - ROV Dr Marchelle Gearing - 30 min vsiit after spiro/dlco

## 2022-11-14 NOTE — Progress Notes (Signed)
Spiro/DLCO performed today. 

## 2022-11-14 NOTE — Progress Notes (Addendum)
OV 02/22/2016  Chief Complaint  Patient presents with   Pulmonary Consult    Pt referred by Dr.Byrum for ILD. Pt states he feels he is still recovering from the lung biopsy. Pt denies cough and CP/tightness.     2nd opinionand transfer of care for ILD/UIP - IPF from DR Byrum. Presents with his wife. They related to Dr. Jens Som cardiologist   67 year old male found to have crackles on physical exam and subsequently surgical lung biopsy 02/02/2016 showed UIP as read by pathologist in [place].  [employer] of chest physicians interstitial lung disease questionnaire  Symptoms: He does not cough and is not troubled by shortness of breath except with strenuous exercise. Onset is only "recently" Past medical history: Positive for chronic sinus drainage mild. Recently after surgical lung biopsy was being a few bone unintentional weight loss. Otherwise negative Personal exposure history: He smoked occasional cigarettes having grown up on her tobacco farm starting at 58 including when he was age 68.  Family history: All the male members in his family who smoke developed COPD but also did not smoke to the pulmonary fibrosis especially his father and his paternal uncles. His dad was diagnosed with pulmonary fibrosis at age of 64 in 11-Dec-2001 and died from progressive hypoxemic respiratory failure after what sounds like an IPF flare in Dec 11, 2005.  Home exposure history: He is lived in the same house since December 12, 1987. The home does not have a humidifier or sound or hot tub or Jacuzzi. He is not exposed to any birds. 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 Approximately 30 years of work ending 4 years ago used to cut through dry walls and was exposed to concrete dust and drywall dust and possibly asbestos. HE is to  cut through insulation safe. Denies any farm work, Pension scheme manager was Patent examiner or being a Chiropractor. Did not work in a minor quarry a Theatre manager on [employer]. Denies any exposure to birds of feathers  Autoimmune history: Is negative.  History of exposure to pulmonary toxic drugs: Negative   Lab work - April 2017 and November 2017 extensive autoimmune and vascular this panel is negative  - High resolution CT scan of the chest 2015/12/12: UIP pattern that is classic - personally visualized. There is also 5 mm right lower lobe lung nodule and a 5.9 cm left-sided lower bulla [stable compared to April 2017]   Severity - Pulmonary function test 08/02/2015: Isolated reduction in diffusion capacity 17.33/61%. Otherwise FVC 4 L/93% and normal. Total lung capacity 5.9 L/91% and normal - Walking desaturation test 185 feet 3 laps on room air in the office 02/22/2016: Did not desaturate but gets symptomatic   Emotional state: He is extremely worried about the diagnosis of IPF. This is particularly so because he has a strong family history and his father died within 4 years usually was diagnosed much order at age 67. Patient is worried that he got diagnosed with the same problem and much younger age of 67 and feels that he only will have a few years to live. Multiple times during the visit he was teary-eyed   OV 07/21/2016  Chief Complaint  Patient presents with   Follow-up  Pt denies change in breathing since last OV. Pt denies cough and SOB and CP/tightness.      Follow-up idiopathic pulmonary fibrosis familial variety  He presents with his wife. Overall he is stable. 4 routine activities of daily living and mowing the yard he does not have any shortness of breath or cough. He did find the mountains recently been quite dyspneic. This is worse than June 2017 with for the diagnosis of IPF. Overall he is tolerating the Ofev just  fine except occasionally he has some GI discomfort.He is questions about research trials, exercise, pulmonary fibrosis foundation and family genetics.he is quite active and diffuse pulmonary rehabilitation is easy for him. He is willing to go to [facility] and work on aerobic strengthening and muscle conditioning  Notice that she's not had pulmonary function testing since May 2017    has a past medical history of Dyspnea; Hyperlipemia; and Pneumonia.   reports that he has never smoked. He has never used smokeless tobacco.  OV 10/25/2016  Chief Complaint  Patient presents with   Follow-up    Pt here after PFT. Pt states his breathing is unchanged. Pt c/o PND and dry cough. Pt denies f/c/s and CP/tightenss.    Follow-up mild idiopathic pulmonary fibrosis he's on Ofev since  NOv 2017  Here with his wife. Overall he is stable. His pulmonary function tests and CT scan of the chest shows continued stability for 1 year. Symptom-wise also he is stable. His dyspnea when doing mountains. He is kayaking and biking and is able to outpace his wife. During this time he does not check his pulse oximetry. He does not have one. I've advised that he can get one. He is asking about familial issues, research and also prognosis we went over all this. Of note CT scan of the chest shows also he has concomitant emphysema. He prefers to avoid medications but he is interested in taking an inhaler if it means improving his dyspnea. He is active with pulmonary fibrosis foundation patient support groupo. Of note he reports easy bruising in his forearm when he bumps into something. His wife thinks it's been there all along and is unchanged. He is wondering if that this is related to Ofev and asa. We did discuss about the bleeding risk of Ofev and the need to hold this before any procedure. Overall he tolerates Ofev just fine except for occasional diarrhea once a month. This is associated with fiber food    OV   02/26/2017  Chief Complaint  Patient presents with   Follow-up    PFT done today. Pt still taking OFEV and doing good on it. Denies any complaints with cough, SOB, or CP.    Follow-up idiopathic pulmonary fibrosis on Nintendanib since November 2017  This time his wife is not here with him.  He has now been on nintedanib for a year.  Overall he is tolerating it fine except for mild occasional diarrhea.  He continues to be very active working out in the yard and doing daily walks with a few miles.  He notices only mild dyspnea with this relieved by rest.  He does not feel his health has declined in the last 1 year.  He does have concomitant emphysema on the CT scan but not apparent on the PFTs.  I asked him to try Spiriva last time but it did not help.  He is dissatisfied with occasional albuterol use.  He did have pulmonary function test today which is documented  below.  The FVC is stable but there is a change in the DLCO but he is not feeling it.  He is on IPF registry by [facility];  he is due for a study visit today.  He is interested in further studies especially [place].  We discussed this in detail brief.  He has had genetics testing he got the result.  He is worried insurance will deny this.  He is participating in the pulmonary fibrosis foundation patient support group.  He is asking details about the March 17, 2017 local [place]   OV 05/22/2017  Chief Complaint  Patient presents with   Follow-up    PFT done today.  Pt stated he had the flu 05/05/17 and was not able to eat on 05/06/17 so he missed two OFEV doses. States he has had diarrhea a couple times since then but is now doing fine with help from immodium.  States his breathing has been doing good.      Follow-up idiopathic pulmonary fibrosis on Nintendanib since November 2017   67 year old male with idiopathic pulmonary fibrosis.  He is on stable dose of nintedanib.  He tells me earlier this month he had stomach flu and that he suddenly  had subjective sense of fever associated with diarrhea and vomiting.  This resolved in 2 days.  Since then he is lost a couple of pounds of weight.  He is worried about tolerating the nintedanib.  In addition primary function test shows a 3% decline in FVC and 2 years and a 12% decline in DLCO in 2 years.  He is now wondering if the drug is effective.  Overall other than the recent illness and occasional mild diarrhea he is tolerated off of just fine.  But he is now worried about the tolerance.  He has been advised by [facility] nursing support team about taking protein with nintedanib to offset side effects and he has questions about this.   OV 08/14/2017  Chief Complaint  Patient presents with   Follow-up    PFT done today. Pt states he has been doing good since last visit and denies any complaints.     Follow-up idiopathic pulmonary fibrosis surgical lung biopsy diagnosis February 02, 2016 on Nintendanib since November 2017  Mr. Cochren continues to do well.  At the time of last visit there was concern about weight loss and also progression in lung function.  However at that time he had a respiratory infection.  Today he comes back and tells me that he has gained 1 pound of weight.  His respiratory symptoms have resolved he is back to being at a stable mild exertional dyspnea.  He is extremely active.  Concordant with his symptom improvement his FVC has improved and his DLCO is stable.  Overall his pulmonary function test is stable since November 2018 at least with a DLCO although the DLCO has progressed since 2017.  His FVC has been stable all along since May 2017.  He is not interested in the [place] research study because of the oral pill and can make his diarrhea potentially worse if he gets randomized to the drug.  He is interested in an upcoming inhaler trial of IPF.  He is active in the support group and is asking about the next meeting.  He is not on oxygen.        Ct Chest High  Resolution  Result Date: 10/25/2016 CLINICAL DATA:  Interstitial lung disease, former smoker, cough. Lung biopsy November 2017. EXAM:  CT CHEST WITHOUT CONTRAST TECHNIQUE: Multidetector CT imaging of the chest was performed following the standard protocol without intravenous contrast. High resolution imaging of the lungs, as well as inspiratory and expiratory imaging, was performed. COMPARISON:  11/23/2015 and 07/08/2015. FINDINGS: Cardiovascular: Vascular structures are unremarkable. Heart size normal. No pericardial effusion. Mediastinum/Nodes: Mediastinal lymph nodes are not enlarged by CT size criteria. Hilar regions are difficult to evaluate without IV contrast. No axillary adenopathy. Esophagus is grossly unremarkable. Lungs/Pleura: Centrilobular and paraseptal emphysema with a large bullous lesion in the left lower lobe. Post biopsy changes in the right upper, right middle and right lower lobes. Superimposed basilar predominant subpleural reticulation, ground-glass and traction bronchiectasis/bronchiolectasis, likely stable from baseline examination of 07/08/2015 when differences in slice collimation are considered. There may be scattered honeycombing. Mild subpleural nodularity along the right major fissure is unchanged from 07/08/2015, indicative of subpleural lymph nodes. No pleural fluid. Airway is unremarkable. Upper Abdomen: Visualized portions of the liver and gallbladder are unremarkable. Mild nodular thickening of both adrenal glands. Visualized portion of the right kidney is unremarkable. 2.5 cm low-attenuation lesion in the left kidney is likely a cyst. Visualized portions of the spleen, pancreas, stomach and bowel are grossly unremarkable. No upper abdominal adenopathy. Musculoskeletal: No worrisome lytic or sclerotic lesions. Degenerative changes in the spine. IMPRESSION: 1. Pulmonary parenchymal pattern of fibrosis is likely stable from 07/08/2015 when differences in slice collimation are  considered. Findings are in keeping with the pathologic diagnosis of usual interstitial pneumonitis. 2.  Emphysema (ICD10-J43.9). Electronically Signed   By: Leanna Battles M.D.   On: 10/25/2016 08:23   OV 02/26/2018  Subjective:  Patient ID: Shaun [name], male , DOB: 1955-11-25 , age 55 y.o. , MRN: [id number] , ADDRESS: [address] [place] [address]   02/26/2018 -   Chief Complaint  Patient presents with   Follow-up    breathing well     HPI CHERRY MAZZAFERRO 67 y.o. -follow-up familial IPF.  Last seen in May 2019.  After that in July 2019 he is screened for the electric face to inhaler study but he screen failed because he is standard of care CT scan of the chest from July 2018 was not per sponsored protocol or 2018 ATS protocol.  At this point in time he continues on nintedanib.  He is not taking proton pump inhibitor and wants to know if that is okay.  He feels he is stable.  There are no new issues.  The main thing is with nintedanib once a month he will have sporadic diarrhea.  He says he has been unable to control this but from a respiratory standpoint he feels he is stable.  There are no new medical issues.  He is up-to-date with his vaccines although immunization record review shows that he could benefit from Prevnar 13 which she has not had.  He continues to be actively engaged in the patient support group.   ROS - per HPI    OV 04/29/2018  Subjective:  Patient ID: Shaun [name], male , DOB: 11-27-1955 , age 87 y.o. , MRN: [id number] , ADDRESS: [address] [place] [address]   04/29/2018 -   Chief Complaint  Patient presents with   Follow-up    PFT performed today.  Pt states he is about the same since last visit.      HPI MONTANA OTTMAR 67 y.o. -returns for IPF follow-up.  He is on nintedanib.  He reports continued stability.  He  still has ongoing occasional issue with diarrhea that happened suddenly every few weeks.  It is so random.  It bothers  him and does affect his quality of life.  He takes Imodium in response.  We did discuss about taking Imodium proactively once a week.  He is now to give this a try.  He continues to be interested in pulmonary reserve study particularly face to inhaler study sponsored by [employer].  Our research department is getting ready and probably should be able to screen him in the next few to several weeks.  His last pulmonary function test was today and shows continued stability.  Last liver function test was November 2019 and was normal.  His walk test today is baseline     ROS   OV 11/29/2018  Subjective:  Patient ID: Shaun [name], male , DOB: [birth year] , age 20 y.o. , MRN: [id number] , ADDRESS: [address] [place] [address]   11/29/2018 -   Chief Complaint  Patient presents with   IPF (idiopathic pulmonary fibrosis)    No issues, states medication is working for him.   Follow-up idiopathic pulmonary fibrosis on Nintendanib since November 2017  HPI SHAHEED KOERBER 67 y.o. -presents for follow-up of IPF.  Last visit was early in 2020.  And then the pandemic care.  He try to get down an inhaler trial of phase 2 for IPF - [place] but he screen failed because of technical issues with the spirometry.  He says overall he is doing well.  He is just following low risk activities.  He has a christening coming up for his grandson.  This will be outdoors in [place].  A total of 12 people will attend.  Everybody will be Masked.  He continues taking nintedanib and does not have any issues tolerating it.  Last liver function test was over 6 months ago.  He needs another one now.  He is also asking for a flu shot.  His symptom score and walking desaturation test showed continued stability.  OV 07/15/2019  Subjective:  Patient ID: Shaun [name], male , DOB: September 10, 1955 , age 51 y.o. , MRN: [id number] , ADDRESS: [address] [place] [address]   07/15/2019 -   Chief Complaint  Patient  presents with   Follow-up    Follow-up idiopathic pulmonary fibrosis on Nintendanib since November 2017   HPI ESTABAN MURRAY 67 y.o. -presents for IPF follow-up.  Last seen August 2020.  This visit was supposed to have a pulmonary function test but this was not scheduled.  Patient overall feels stable.  Dyspnea is stable and minimal.  He walks on a regular basis 45 minutes.  He tolerates nintedanib but has mild symptoms.  He is asking about going to a lower dose but tells me that if the drug has been beneficial he says he can manage with the current symptoms.  He says he has had the COVID-19 vaccine at this point.  Last second dose was 1 week ago.  He is in the IPF pro registry.  He is not on any interventional trial.  He screen fell for an inhaler trial but that has been withdrawn because of safety reasons.  He was never a participant in the trial.  He is interested in other trials.  He reported that he is going to be 67 years old and will become a Medicare recipient in the next several months.  He is going to look for insurance options.  Walking desaturation test was stable.  He is okay waiting till the next visit for spirometry.     OV 01/14/2020   Subjective:  Patient ID: Shaun [name], male , DOB: [birth year], age 38 y.o. years. , MRN: [id number],  ADDRESS: [address] [place] [address] PCP  Lupita Raider, MD Providers : Treatment Team:  Attending Provider: Kalman Shan, MD Patient Care Team: Lupita Raider, MD as PCP - General (Family Medicine)    Chief Complaint  Patient presents with   Follow-up    PFT performed 10/12.  Pt states he has been doing okay since last visit. States that he is still having problems with diarrhea which he believes has become worse.    IPF diagnosed in November 2017.   HPI MAKAYLA STEVENS 67 y.o. -returns for follow-up of his pulmonary fibrosis IPF.  He is coming up for years since diagnosis.  Overall he reports that the  stability in his symptoms see below.  He tells me that when he goes for a 35/45-minute walk he has difficulty with inclines.  He did say this is overall stable but may be compared to a year or 2 ago it is slightly worse.  Review of his pulmonary function test shows compared to 2017 there are some decline in both FVC and DLCO and compared to early 2020 there is slight decline in DLCO although compared to most recent numbers his DLCO is better.  His weight is stable.  Overall he is tolerating the nintedanib fine but he is having diarrhea.  The diarrhea is uncomfortable but nevertheless is only 2 episodes a week.  Last visit I advised him to take Imodium once a week on a scheduled basis but he says he prefers not to take medicine and he takes it as needed.  If this is then restarting to him using Imodium twice a week.  We discussed and is willing to try once a week schedule.  The other alternative he wanted to discuss was reducing the nintedanib to 100 mg twice daily.  I cautioned against this given the fact his lungs have had only a slight progression nevertheless there is progression and with the lower dose of nintedanib may be it is less effective.  Therefore is going to try the Imodium route.  He has upcoming colonoscopy because of his diarrhea.  We advised the to stop the nintedanib 2 days before and 1-2 days after the procedure.  Similarly for dental procedure he will stop the nintedanib 2 days before and resume 1 or 2 days after the procedure.   He is interested in research protocol.  We discussed several studies.  He is not interested in the Phase 2 [place] study.  We discussed promedior IV infusion study.  He wants to think about this.  We will send in the consent form.  This upcoming study with an inhaler that is already preapproved in the market for pulmonary hypertension.  Is called treprostinil.  If we get the study then he might be interested in this.   OV 12/22/2020  Subjective:  Patient ID:  Shaun [name], male , DOB: 1955/10/28 , age 82 y.o. , MRN: [id number] , ADDRESS: [address] [place] [address] PCP Lupita Raider, MD Patient Care Team: Lupita Raider, MD as PCP - General (Family Medicine)  This Provider for this visit: Treatment Team:  Attending Provider: Kalman Shan, MD    12/22/2020 -   Chief Complaint  Patient presents with   Follow-up  PFT performed today. Pt states he is about the same since last visit. Currently has some problems with sinus drainage.    IPF diagnosed in November 2017.  Family of pulmonary fibrosis - last CT March 2020 - IPF-Pro registre HPI OSHA KEMPNER 67 y.o. -returns for follow-up.  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  Overall he is doing stable.  He is adjusted his diet and his diarrhea with nintedanib is under much better control.  His weight is stable.  His symptom scores are stable.  Side effect from diarrhea improved.  He had a 6-minute walk test and this was normal.  He had liver function test today and it is normal.  He had questions about his prognosis.  I explained that given stability of ILD that more than likely is going to be stable in the next 1 year and beyond that it is difficult to predict.  This is based on Calculator.  We looked at his trend line with the FVC and DLCO.  His DLCO this is improved compared to last year.  Therefore overall it appears that he had a decline between 2017 and 2018 but since late 2018 he has been stable till the current time September 2022.  He is always been interested in clinical trials as a care option.  He was given the option of inhaler treprostinil study called [facility].Marland Kitchen  He did not want to do oral drug study.  He has reviewed the consent form he is not interested in this because he has to take the inhaler 4 times each day and more than 9 times each time.  We discussed IV recombinant Pentraxin with goodphase 2 to safety  and efficacy data .  I shorten the publication images of the data on this.  I have given him that.  Also give him a detailed consent form forhis for review.  He will read and reflect on this.          07/25/2021 Follow up : IPF  Patient presents for a 26-month follow-up.  Patient has underlying IPF.  Patient says overall breathing is doing about the same.  He gets short of breath with heavy activities but tries to remain active every day.  Typically walks about 30 to 40 minutes every day.  Recently was able to go hiking but did notice that when he was going up an incline/he will he did get some shortness of breath and had to rest.  Patient says his weight has been stable.  Patient remains on Ofev.  Patient says his chronic diarrhea has been managed with Imodium.  Typically is only have to take it about every 3 days or so.  And only takes a half a tablet.  He feels that this has become under better control. Patient says he does check his oxygen levels at home.  Has not noticed any lower oxygen levels. PFTs done on July 12, 2021 are essentially stable.  FEV1 at 108%, ratio 80, FVC 100%, DLCO 62%.  This is very similar to his lung function testing in March 2022. Patient is talking to [employer] research team  and has been referred for  the ILD pro registry.     OV 01/03/2022  Subjective:  Patient ID: Shaun [name], male , DOB: Dec 25, 1955 , age 19 y.o. , MRN: [id number] , ADDRESS: [address] [place] [address] PCP Lupita Raider, MD Patient Care Team: Lupita Raider, MD as PCP -  General (Family Medicine)  This Provider for this visit: Treatment Team:  Attending Provider: Kalman Shan, MD    01/03/2022 -   Chief Complaint  Patient presents with   Follow-up    Review PFT's. Breathing is slightly worse. He gets winded walking up an incline. He is walking approx 2 miles per day. He would like a flu vaccine today.     HPI SURYA OPPEDISANO 67 y.o. -returns for follow-up.   I personally not seen him in a year.  From a respiratory symptom standpoint he is stable as can be seen below.  His weight is also stable.  He marked mild for diarrhea but this is with Imodium.  He says he is worried about taking Imodium every other day.  His diet is actually more than what is listed.  He says this week and he had to go to [place] to see his grandchildren and he had to stop.  He says the diarrhea is unpredictable.  Therefore he is asking if he can go to the low-dose of nintedanib.  We discussed 4  options including taking a holiday and then going to the low-dose versus taking a holiday from nintedanib and going to the full dose versus itching to pirfenidone versus enrolling in a clinical trial involving due to deutrified pirfenidone.  We weighed all the options.  I personally recommended taking a holiday and then rechallenging himself with nintedanib.  We took a shared decision making to go with a low-dose.  He is aware the low-dose might not be as efficacious but given his relative stability over time he is willing to do that.  He is simply does not want to do the high-dose because of the diarrhea.  Of note he is in the IPF-Pro registry and he will participate in that today.  He will have a high-dose flu shot today as well.  Perfect that all       OV 03/21/2022  Subjective:  Patient ID: Shaun [name], male , DOB: [birth year] , age 67 y.o. , MRN: [id number] , ADDRESS: [address] [place] [address] PCP Lupita Raider, MD Patient Care Team: Lupita Raider, MD as PCP - General (Family Medicine)  This Provider for this visit: Treatment Team:  Attending Provider: Kalman Shan, MD    03/21/2022 -   Chief Complaint  Patient presents with   Follow-up    Pt states he has been doing okay since last visit and denies any real complaints.     HPI TERROL PUSKARICH 67 y.o. -presents for follow-up.  Since his last visit around Thanksgiving 2023 picked up sinus  infection and is having a lot of clearing of the throat and sinus drainage.  He tells me that he is got constant sinus drainage that is perennial.  At the last visit because of significant diarrhea he stopped his nintedanib.  I told him to take an interim holiday and restarted on the low-dose protocol.  He is now 100 mg twice daily for the last 3-4 weeks.  His diarrhea has returned but it is nowhere as severe as the past.  He has had 2 or 3 bouts only.  He is also wondering if it is because of the recent respiratory infection that he had.  He states he is on the mend.  He did have colored sputum but right now is all clear but he does have cough more than baseline and sinus drainage more than baseline.  There is no wheezing.  He  is open to trying short course of prednisone.  He did have a CT scan of the chest that shows significant progression.  He is now noticing more difficulty doing hills when he goes for his daily walks.  Symptom score shows worsening.  He has significant emphysema and this might exclude him from some studies even though his FEV1 FVC ratio is normal or greater than 70  We also discussed [employer] DEXA bone scan study for the study of osteoporosis and osteopenia in patients with IPF.  He is willing to go through this.  I have emailed him a consent form.0 0  0 CT Chest data - HRCT 03/13/22  Narrative & Impression  CLINICAL DATA:  Follow-up IPF, interstitial lung disease, history of biopsy   EXAM: CT CHEST WITHOUT CONTRAST   TECHNIQUE: Multidetector CT imaging of the chest was performed following the standard protocol without intravenous contrast. High resolution imaging of the lungs, as well as inspiratory and expiratory imaging, was performed.   RADIATION DOSE REDUCTION: This exam was performed according to the departmental dose-optimization program which includes automated exposure control, adjustment of the mA and/or kV according to patient size and/or use of  iterative reconstruction technique.   COMPARISON:  06/10/2018, 10/24/2016, 11/23/2015, 07/08/2015   FINDINGS: Cardiovascular: Scattered aortic atherosclerosis. Normal heart size. No pericardial effusion.   Mediastinum/Nodes: Unchanged prominent mediastinal lymph nodes. Thyroid gland, trachea, and esophagus demonstrate no significant findings.   Lungs/Pleura: Severe pulmonary fibrosis in a pattern with apical to basal gradient featuring irregular peripheral interstitial opacity, septal thickening, traction bronchiectasis, and large areas of honeycombing at the lung bases. Fibrotic findings are slightly worsened in comparison to most recent examination dated 06/10/2018 and clearly worsened over a longer period of time dating back to 2017. Moderate underlying centrilobular and paraseptal emphysema. No pleural effusion or pneumothorax.   Upper Abdomen: No acute abnormality.   Musculoskeletal: No chest wall abnormality. No acute osseous findings.   IMPRESSION: 1. Severe pulmonary fibrosis in a pattern with apical to basal gradient featuring irregular peripheral interstitial opacity, septal thickening, traction bronchiectasis, and large areas of honeycombing at the lung bases. Fibrotic findings are slightly worsened in comparison to most recent examination dated 06/10/2018 and clearly worsened over a longer period of time dating back to 2017. Findings remain consistent with UIP per consensus guidelines: Diagnosis of Idiopathic Pulmonary Fibrosis: An Official ATS/ERS/JRS/ALAT Clinical Practice Guideline. Am Rosezetta Schlatter Crit Care Med Vol 198, Iss 5, [phone], Dec 02 2016. 2. Moderate underlying emphysema. 3. Unchanged prominent mediastinal lymph nodes, likely reactive.   Aortic Atherosclerosis (ICD10-I70.0) and Emphysema (ICD10-J43.9).     Electronically Signed   By: Jearld Lesch M.D.   On: 03/13/2022 16:19    No results found.   OV 05/15/2022  Subjective:  Patient ID:  Shaun [name], male , DOB: [birth year] , age 47 y.o. , MRN: [id number] , ADDRESS: [address] [place] [address] PCP Lupita Raider, MD Patient Care Team: Lupita Raider, MD as PCP - General (Family Medicine)  This Provider for this visit: Treatment Team:  Attending Provider: Kalman Shan, MD    05/15/2022 -   Chief Complaint  Patient presents with   Follow-up    More SOB with walking up hills.  Not using albuterol MDI.  No difference when used Spiriva    HPI ALMAN NULL 67 y.o. -returns for follow-up.  Initially said he is stable but his pulmonary function test shows persistent decline since April 2023 and slight decline since September 2023.  This is consistent with progression of fibrosis on the December 2023 CT scan.  Walking desaturation test showed pulse ox dropping to 89%.  He then admitted that while doing daily walks walking up hills and all that is more difficult.  He feels that he is getting worse since the fall 2023 when he switched to low-dose nintedanib.  In fact his symptom scores are gone from a dyspnea score of 1-3 and this started around the time he switched to the low-dose nintedanib.  He is wondering if this could be the reason I did acknowledge that low-dose is less effective.  After years of being stable he started having side effects with the full dose nintedanib and therefore the switch was made.  We are quite concerned about disease progression.  The other possibility is onset of pulmonary hypertension.  Currently inhaled treprostinil is approved for WHO group 3 pulmonary hypertension related to ILD.  This is be a standard of care treatment.  Therefore to evaluate this he would need echocardiogram and/or  right heart catheterization..  Previously his BNP is negative.  We discussed getting workup done for pulmonary hypertension.  He is agreeable to this.  There are 2 options he can go through insurance protocol or try to qualify for research  protocol sponsored by [employer] called PHINDER study.  We gave him a copy of the consent.  He later emailed me saying that he is willing to start go going to the research protocol route.  If she screen fails then we will talk to cardiology and try to arrange standard of care right heart catheterization.  If he does not have pulmonary hypertension then we would discuss clinical trials as a care option in the setting of progressive disease.        OV 3?125/24   Subjective:  Patient ID: Shaun [name], male , DOB: [birth year] , age 89 y.o. , MRN: [id number] , ADDRESS: [address] [place] [address] PCP Lupita Raider, MD Patient Care Team: Lupita Raider, MD as PCP - General (Family Medicine) Hunsucker, Lesia Sago, MD as Consulting Physician (Pulmonary Disease)  This Provider for this visit: Treatment Team:  Attending Provider: Kalman Shan, MD   07/04/2022 -   Chief Complaint  Patient presents with   Follow-up    Pt states he is feeling the same. RHC 3/15     HPI ROCZEN KOEHNEN 68 y.o. -returns for follow-up to discuss his right heart catheterization.  In the interim he tried high-dose full dose nintedanib but he could not tolerate it.  Diarrhea came back.  So he is back in low-dose.  He almost quit taking nintedanib but decided to do low-dose and is tolerating this fine.  He also had right heart catheterization 06/16/2022 and it shows mild pulmonary hypertension.  He does meet increase study criteria for inhaled treprostinil.  We went over in detail both treprostinil nebulizer and DPI.  Decided to go with nebulizer as first choice so it is easily titratable.  Discussed side effect profile and anecdotal experience of 20% intolerance.  Discussed the benefits of improved walk distance and dyspnea and potential signal in modulation of IPF.  He is willing to try this.  He will need to do paperwork for this.  Today is a discussion only visit.  Of note he did have  vitamin D deficiency and I prescribed vitamin D supplements for him.  Discussed a lot of nausea therefore he stopped it.  He is willing to get his  vitamin D level rechecked avoid toxicity.     RHC 06/16/22 Hemodynamics (mmHg) RA mean 2 RV 38/4 PA 47/13, mean 26 PCWP mean 7  Oxygen saturations: PA 78% AO 94%  Cardiac Output (Fick) 6.11  Cardiac Index (Fick) 3.57 PVR 3.1 WU  0  OV 08/08/2022  Subjective:  Patient ID: Shaun [name], male , DOB: 06-24-1955 , age 55 y.o. , MRN: [id number] , ADDRESS: [address] [place] [address] PCP Lupita Raider, MD Patient Care Team: Lupita Raider, MD as PCP - General (Family Medicine) Hunsucker, Lesia Sago, MD as Consulting Physician (Pulmonary Disease)  This Provider for this visit: Treatment Team:  Attending Provider: Kalman Shan, MD    08/08/2022 -   Chief Complaint  Patient presents with   Follow-up    F/up on IPF   HPI Shaun [name] 67 y.o. -returns for follow-up.  He started inhaled treprostinil nebulizer 07/12/2022.  Currently taking 4 times a day each time 6 times.  No side effect so far.  Plans to escalate.  He finds the nebulizer inconvenient and wants to switch to the DPI.  I advised him to wait another 4 to 6 weeks.  And to call us and if he is tolerating well at a minimum 9 times nebulizer each time 4 times daily then we can consider switch to DPI.  He plans to go to [place] for his annual summer trip but has postponed this to July 2024 and wants to make this decision at least in the next 4 to 6 weeks.  Supported him on this.  He is tolerating low-dose nintedanib 100 mg twice daily.  Last liver function test February he needs another liver function test today.  He continues to Spiriva.  He feels his dyspnea is improved although the dyspnea score itself is the same.  He feels he is getting some sinusitis because of the nebulizer he still has some green discharge.  He agreed to take doxycycline.  He also  reported anxiety because of his illness.  Wanted to know his life expectancy wanted to know if he could live another 10 years.  I did indicate to him that it was reasonable to expect living another 10 years with this disease.  Depends on some amount of LOC but also the benefit of therapeutics and new advances.  Did discuss transplant was an option.  Rated his current disease as moderate but with progression heading to severe.  But the progression is slow.  Appears that treprostinil is helping him because he is feeling better and he did not desaturate with exertion today [which she did last time].  He deferred referral to [facility] transplant evaluation but he said he will talk to his neighbor who has had a lung transplant.  He is reporting reactive anxiety and depression.  We discussed this and he agreed to be referred to psychology.   OV 11/14/2022  Subjective:  Patient ID: Shaun [name], male , DOB: [birth year] , age 44 y.o. , MRN: [id number] , ADDRESS: [address] [place] [address] PCP Lupita Raider, MD Patient Care Team: Lupita Raider, MD as PCP - General (Family Medicine) Hunsucker, Lesia Sago, MD as Consulting Physician (Pulmonary Disease)  This Provider for this visit: Treatment Team:  Attending Provider: Kalman Shan, MD    Emphysma = alpha-1 MM   IPF diagnosed in November 2017.   - Family of pulmonary fibrosis - last CT March 2020  - emphysema > Fibrosis -Repeat CT scan of the  chest December 2023: With progression and significant emphysema present. - IPF-Pro registre -Low-dose nintedanib since Oct 2023  Combined pulmonary fibrosis and emphysema - with fibrosis much biggter component and epysema only mild due to fev/10fc fation > 70 at all time  WHO group 3 pulmonary hypertension diagnosed 06/16/2022: Mean PAP 26, PCWP 7, PVR 3.1 -> tuyvaso nebs 07/12/22   Saw counselor r September 12, 2022 for adjustment disorder with anxious mood.  11/14/2022 -   Chief  Complaint  Patient presents with   Follow-up    F/up on PFT     HPI TORETTO TOPPING 67 y.o. -he is now on Tyvaso DPI.  As of September 27, 2022 pharmacist noted, "Patient is titrating to Tyvaso DPI four times daily on 09/27/22. Patient is scheduled for in person visit today for DPI training .  Since then he has been on DPI.  He is able to tolerate only 48 mcg 4 times daily.  He says when he went to the nebulizer at 10 times it made him feel uneasy.  He has no side effects of the current DPI dose.  Is also taking nintedanib low-dose protocol.  He says the combination is really helped him he is feeling less short of breath.  Symptom scores below show improvement in shortness of breath.  No side effects from the treatment.  His pulmonary function test was also stabilized since he started inhaled treprostinil.  He has about respiratory vaccines and I recommended flu shot and updated COVID-vaccine.     SYMPTOM SCALE - ILD 8/28/202 0 140# 07/15/2019  01/14/2020 138# 12/22/2020 138# 01/03/2022 142# 03/21/2022 144# low dose ofev since oct- nov 2023 05/15/2022 Low dose ofev 06/26/22 08/08/2022 Low dose ofev + tyvaso since 07/12/22 11/14/2022 Low dose ofev + tyvasos dpi  O2 use Room air   ra ra ra ra ra ra ra  Shortness of Breath 0 -> 5 scale with 5 being worst (score 6 If unable to do)           At rest 0 0 0 0 0 0 0 0 0 0   Simple tasks - showers, clothes change, eating, shaving 0 0 0 0 0 0 0 0 00 0   Household (dishes, doing bed, laundry) 0 0  0 0  0 0 0 0  Shopping 0 0 0 0 0 00 0 0 0 0   Walkin sel pac 0 0 00 0 0 1 1 0 1 0   Walking up Stairs 1 1 1 1 1 2 2 2 2 1   Total (40 - 48) Dyspnea Score 1 1 1 1 1 3 3 3 3 1   How bad is your cough? 0 0 1 0 0 0 1 1 2 0   How bad is your fatigue 0 1 1 1 1 1 1 0 1 10   nausea  1 1 0 0 0 0 0 0 0  vomit  0 0 0 0 0 0 0 0 00  diarhea  1 2 1 2 1 1 1 1  0  anxity  1 1 1 1 1 1 1 2  00  depresion  1 1 0 1 1 1  x 2 0     Simple office walk - 250 feet x 3 laps  02/26/2018  04/29/2018  11/29/2018  07/15/2019  03/21/2022   05/15/2022  08/08/2022 Low dose ofev and tyvaso  O2 used Room air Room air Room air ra ra ra ra  Number laps completed 3 3 3 3  3  3 3  Comments about pace Good brisk        Resting Pulse Ox/HR 100% and 74/min 100% and 86/min     100% and 76/min 100%and 79/min 99% ad HR 75 97% and Hr 98% and HR79  Final Pulse Ox/HR 96% and 96/min 97% and 93/min 96% and 99/min 96% and 111/min 93% and HR 100 89% and HR 92% and HR 98  Desaturated </= 88% no no no no No     Desaturated <= 3% points yes Yes, 3 Yes, 4 Yes, 4 points Yes 6 point  Yes 4 points  Got Tachycardic >/= 90/min yes yes yes yes yes    Symptoms at end of test No dyspnea No dyspnea  asympt Lot of throast clearning  No dyspnea. better  Miscellaneous comments x x         PFT     Latest Ref Rng & Units 11/14/2022   11:13 AM 06/13/2022   10:25 AM 05/15/2022    9:10 AM 12/27/2021    9:48 AM 07/12/2021   11:46 AM 12/22/2020    2:13 PM 06/03/2020   11:27 AM  ILD indicators  FVC-Pre L 3.74  P 3.77  3.69  3.83  3.94  4.03  3.95   FVC-Predicted Pre % 92  P 95  93  97  100  101  99   TLC L  5.44        TLC Predicted %  86        DLCO uncorrected ml/min/mmHg 11.81  P 11.57  12.96  12.42  14.62  14.65  15.03   DLCO UNC %Pred % 49  P 48  54  52  62  62  63   DLCO Corrected ml/min/mmHg 11.81  P 11.57  12.96  12.42  14.62  14.65  15.03   DLCO COR %Pred % 49  P 48  54  52  62  62  63     P Preliminary result      LAB RESULTS last 96 hours No results found.  LAB RESULTS last 90 days Recent Results (from the past 2160 hour(s))  Pulmonary function test     Status: None (Preliminary result)   Collection Time: 11/14/22 11:13 AM  Result Value Ref Range   FVC-Pre 3.74 L   FVC-%Pred-Pre 92 %   FEV1-Pre 3.14 L   FEV1-%Pred-Pre 105 %   FEV6-Pre 3.74 L   FEV6-%Pred-Pre 98 %   Pre FEV1/FVC ratio 84 %   FEV1FVC-%Pred-Pre 113 %   Pre FEV6/FVC Ratio 100 %   FEV6FVC-%Pred-Pre 105 %   FEF  25-75 Pre 3.17 L/sec   FEF2575-%Pred-Pre 134 %   DLCO unc 11.81 ml/min/mmHg   DLCO unc % pred 49 %   DLCO cor 11.81 ml/min/mmHg   DLCO cor % pred 49 %   DL/VA 4.09 ml/min/mmHg/L   DL/VA % pred 61 %         has a past medical history of Dyspnea, Family history of colon cancer, Family history of prostate cancer, Family history of pulmonary fibrosis, Hyperlipemia, and Pneumonia.   reports that he has never smoked. He has never used smokeless tobacco.  Past Surgical History:  Procedure Laterality Date   COLONOSCOPY WITH PROPOFOL N/A 09/21/2014   Procedure: COLONOSCOPY WITH PROPOFOL;  Surgeon: Charolett Bumpers, MD;  Location: [facility];  Service: Endoscopy;  Laterality: N/A;   FOREARM SURGERY     excision of birth mark"child"   HERNIA REPAIR  RIH-open   KNEE ARTHROTOMY Left    bursa excision   LUNG BIOPSY Right 02/02/2016   Procedure: LUNG BIOPSY;  Surgeon: Loreli Slot, MD;  Location: [facility] OR;  Service: Thoracic;  Laterality: Right;   NASAL SEPTUM SURGERY     RIGHT HEART CATH N/A 06/16/2022   Procedure: RIGHT HEART CATH;  Surgeon: Laurey Morale, MD;  Location: [facility] INVASIVE CV LAB;  Service: Cardiovascular;  Laterality: N/A;   VIDEO ASSISTED THORACOSCOPY Right 02/02/2016   Procedure: VIDEO ASSISTED THORACOSCOPY;  Surgeon: Loreli Slot, MD;  Location: [facility] OR;  Service: Thoracic;  Laterality: Right;   WRIST GANGLION EXCISION Left     Allergies  Allergen Reactions   Penicillins Other (See Comments)    UNSPECIFIED REACTION OF CHILDHOOD Has patient had a PCN reaction causing immediate rash, facial/tongue/throat swelling, SOB or lightheadedness with hypotension:unsure Has patient had a PCN reaction causing severe rash involving mucus membranes or skin necrosis:unsure Has patient had a PCN reaction that required hospitalization:No Has patient had a PCN reaction occurring within the last 10 years:No If all of the above answers are "NO", then may proceed with  Cephalosporin use.     Immunization History  Administered Date(s) Administered   Fluad Quad(high Dose 65+) 12/02/2020, 01/03/2022   Influenza Split 01/13/2011, 01/02/2012, 01/13/2013, 12/01/2013, 12/04/2014, 11/13/2015, 12/09/2016   Influenza,inj,Quad PF,6+ Mos 12/09/2016, 11/26/2017, 11/29/2018, 12/31/2019   Influenza-Unspecified 11/02/2015, 12/09/2016   Moderna SARS-COV2 Booster Vaccination 01/31/2020   Moderna Sars-Covid-2 Vaccination 06/05/2019, 07/08/2019, 01/31/2020, 07/05/2020   Pneumococcal Conjugate-13 02/26/2018   Pneumococcal Polysaccharide-23 02/05/2016   Td 07/12/2017   Td (Adult) 08/01/1997   Tdap 12/10/2007   Zoster Recombinant(Shingrix) 12/09/2016, 02/10/2017   Zoster, Live 05/05/2015, 11/02/2015, 12/09/2016, 02/10/2017    Family History  Problem Relation Age of Onset   Lung cancer Mother    Colon cancer Mother 76   Pulmonary fibrosis Father        dx in his 69s   Prostate cancer Brother 52   COPD Paternal Grandfather    Other Paternal Uncle        possible lung dx; died around age 28   Other Cousin        possible lung dx, died in her 12s; paternal first cousin   Healthy Son      Current Outpatient Medications:    Chlorpheniramine-Pseudoeph 4-60 MG TABS, Take 1 tablet by mouth 2 (two) times daily as needed (for allergies.)., Disp: , Rfl:    Multiple Vitamin (MULTIVITAMIN WITH MINERALS) TABS tablet, Take 1 tablet by mouth daily., Disp: , Rfl:    Nintedanib (OFEV) 100 MG CAPS, TAKE 1 CAPSULE BY MOUTH TWICE DAILY WITH FOOD., Disp: 180 capsule, Rfl: 1   simvastatin (ZOCOR) 10 MG tablet, Take 10 mg by mouth daily at 6 PM. , Disp: , Rfl: 0   Tiotropium Bromide Monohydrate (SPIRIVA RESPIMAT) 2.5 MCG/ACT AERS, Inhale 2 puffs into the lungs daily., Disp: 8 g, Rfl: 0   TYVASO DPI MAINTENANCE KIT 48 MCG POWD, Inhale 48 mcg into the lungs in the morning, at noon, in the evening, and at bedtime., Disp: , Rfl:    Cholecalciferol (VITAMIN D3) 1.25 MG (50000 UT) CAPS,  Take once a week for 12 weeks then once a month on the first of each month (Patient not taking: Reported on 08/08/2022), Disp: 30 capsule, Rfl: 5   doxycycline (VIBRA-TABS) 100 MG tablet, Take 1 tablet (100 mg total) by mouth 2 (two) times daily. (Patient not taking: Reported on 11/14/2022), Disp:  14 tablet, Rfl: 0      Objective:   Vitals:   11/14/22 1406  BP: 120/70  Pulse: 88  SpO2: 96%  Weight: 144 lb (65.3 kg)  Height: 5\' 7"  (1.702 m)    Estimated body mass index is 22.55 kg/m as calculated from the following:   Height as of this encounter: 5\' 7"  (1.702 m).   Weight as of this encounter: 144 lb (65.3 kg).  @WEIGHTCHANGE @  [employer]   11/14/22 1406  Weight: 144 lb (65.3 kg)     Physical Exam   General: No distress. Looks well O2 at rest: no Cane present: no Sitting in wheel chair: no Frail: no Obese: no Neuro: Alert and Oriented x 3. GCS 15. Speech normal Psych: Pleasant Resp:  Barrel Chest - no.  Wheeze - no, Crackles - yes base, No overt respiratory distress CVS: Normal heart sounds. Murmurs - no Ext: Stigmata of Connective Tissue Disease - no HEENT: Normal upper airway. PEERL +. No post nasal drip        Assessment:       ICD-10-CM   1. IPF (idiopathic pulmonary fibrosis) (HCC)  J84.112 Hepatic function panel    2. WHO group 3 pulmonary arterial hypertension (HCC)  I27.23 Hepatic function panel    3. Pulmonary emphysema, unspecified emphysema type (HCC)  J43.9 Hepatic function panel    4. Therapeutic drug monitoring  Z51.81 Hepatic function panel         Plan:     Patient Instructions   IPF (idiopathic pulmonary fibrosis) (HCC) Familial idiopathic pulmonary fibrosis (HCC) Therapeutic drug monitoring Diarrhea due to drug  - IPF definitely progressive over time slowly but has stabilized [id number] -> August 2024 after adding tyvaso to regimen -Low-dose nintedanib is better tolerated and unable to tolerate full dose anymore -  On Tyvaso  since April 2024 at QID DPI Dosing  Plan  -Continue ofev low-dose protocol at 100 mg twice daily - continue tyvaso dpi as before - liver function test 11/14/2022 - spirometry and dlco in 16 weeks - 20 weeks - discussed [facility] transplant eval - defer for now    Pulmonary emphysema, unspecified emphysema type (HCC)  -You to have associated emphysema but this is the much minor component because fev1/fvc ratio always > 70 - curently stable  Plan - Continue Spiriva Respimat samples 2.5 mcg dose and take it 2 puffs once daily  WHO-3 pulmonary hypertension - new diagnosis 06/16/22; started tyvaso 07/12/22  Doing well  Plan  - Cotinue  treprostinil [AKA Tyvaso] DPI;  Anxiety/Depression - new reactive to medical illness; mild  - improved after seeing psychologist  Plan  - supportive care     - Followup - 16-20  weeks - ROV Dr Marchelle Gearing - 30 min vsiit after spiro/dlco   FOLLOWUP Return in about 5 months (around 04/16/2023) for ILD, 30 min visit, after Cleda Daub and DLCO, with Dr Marchelle Gearing, Face to Face Visit.    SIGNATURE    Dr. Kalman Shan, M.D., F.C.C.P,  Pulmonary and Critical Care Medicine Staff Physician, [facility] Director - Interstitial Lung Disease  Program  Pulmonary Fibrosis [facility] [place], [place], [address]  Pager: [phone], If no answer or between  15:00h - 7:00h: call [phone] Telephone: [phone]  2:26 PM 11/14/2022

## 2022-11-14 NOTE — Addendum Note (Signed)
Addended by: Winn Jock on: 11/14/2022 02:44 PM   Modules accepted: Orders

## 2022-11-14 NOTE — Patient Instructions (Signed)
Spiro/DLCO performed today. 

## 2022-12-15 ENCOUNTER — Other Ambulatory Visit: Payer: Self-pay | Admitting: Internal Medicine

## 2022-12-15 DIAGNOSIS — J84112 Idiopathic pulmonary fibrosis: Secondary | ICD-10-CM

## 2022-12-15 NOTE — Telephone Encounter (Signed)
Refill sent for OFEV to [facility] (pulmonary fibrosis team): [phone]  Dose: 100mg  twice daily  Last OV: 11/14/22 Provider: Dr. Marchelle Gearing Pertinent labs: LFTs on /13/24 wnl  Next OV: 4-5 months (not yet scheduled)  Routing to scheduling team for follow-up on appt scheduling  Chesley Mires, PharmD, MPH, BCPS Clinical Pharmacist (Rheumatology and Pulmonology)

## 2023-01-02 ENCOUNTER — Telehealth: Payer: Self-pay | Admitting: Internal Medicine

## 2023-01-02 NOTE — Telephone Encounter (Signed)
Pt calling in having questions about his Tyvaso medication, ,not sure if his insurance company will cover it in the upcoming year.

## 2023-01-04 NOTE — Telephone Encounter (Signed)
Spoke with patient - he states he received communication from [facility] that Tyvaso DPI is going to become non-formulary. He is currently in catastrophic stage so no copay for Ofev or Tyvaso DPI. He is unsure if Patient Assistance [employer] has any money remaining but advised that we will know in Jan 2025 whether grant has enough funds. Recommended he not worry about this now and we will receive communication from pharmacy if any issues with cost. Recommended he call back with any other issues  Chesley Mires, PharmD, MPH, BCPS, CPP Clinical Pharmacist (Rheumatology and Pulmonology)

## 2023-03-13 ENCOUNTER — Other Ambulatory Visit: Payer: Self-pay | Admitting: *Deleted

## 2023-03-13 DIAGNOSIS — J84112 Idiopathic pulmonary fibrosis: Secondary | ICD-10-CM

## 2023-03-15 ENCOUNTER — Ambulatory Visit: Payer: Medicare Other | Admitting: Internal Medicine

## 2023-03-15 ENCOUNTER — Telehealth: Payer: Self-pay | Admitting: Internal Medicine

## 2023-03-15 ENCOUNTER — Ambulatory Visit (INDEPENDENT_AMBULATORY_CARE_PROVIDER_SITE_OTHER): Payer: Medicare Other | Admitting: Internal Medicine

## 2023-03-15 VITALS — BP 130/79 | HR 81 | Temp 98.0°F | Ht 67.0 in | Wt 147.0 lb

## 2023-03-15 DIAGNOSIS — Z5181 Encounter for therapeutic drug level monitoring: Secondary | ICD-10-CM | POA: Diagnosis not present

## 2023-03-15 DIAGNOSIS — J439 Emphysema, unspecified: Secondary | ICD-10-CM | POA: Diagnosis not present

## 2023-03-15 DIAGNOSIS — I2723 Pulmonary hypertension due to lung diseases and hypoxia: Secondary | ICD-10-CM

## 2023-03-15 DIAGNOSIS — J84112 Idiopathic pulmonary fibrosis: Secondary | ICD-10-CM

## 2023-03-15 LAB — PULMONARY FUNCTION TEST
DL/VA % pred: 52 %
DL/VA: 2.19 ml/min/mmHg/L
DLCO cor % pred: 43 %
DLCO cor: 10.56 ml/min/mmHg
DLCO unc % pred: 43 %
DLCO unc: 10.56 ml/min/mmHg
FEF 25-75 Pre: 3.3 L/s
FEF2575-%Pred-Pre: 140 %
FEV1-%Pred-Pre: 101 %
FEV1-Pre: 3.03 L
FEV1FVC-%Pred-Pre: 114 %
FEV6-%Pred-Pre: 93 %
FEV6-Pre: 3.57 L
FEV6FVC-%Pred-Pre: 105 %
FVC-%Pred-Pre: 88 %
FVC-Pre: 3.57 L
Pre FEV1/FVC ratio: 85 %
Pre FEV6/FVC Ratio: 100 %

## 2023-03-15 NOTE — Patient Instructions (Signed)
Spirometry/DLCO performed today. 

## 2023-03-15 NOTE — Patient Instructions (Addendum)
  IPF (idiopathic pulmonary fibrosis) (HCC) Familial idiopathic pulmonary fibrosis (HCC) Therapeutic drug monitoring   - IPF definitely progressive over time slowly but has stabilized [id number] ->  Dec 2024 after adding tyvaso to regimen -Low-dose nintedanib is better tolerated and unable to tolerate full dose anymore due to dharhea  - still with some diarreha   Plan  -Continue ofev low-dose protocol at 100 mg twice daily  - see diarrhea management with CAROB flour below; if this works well we can look at going to full dose ofev - liver function test 03/15/2023 - spirometry and dlco in 16 weeks - 20 weeks - discussed [facility] transplant eval - defer for now    Pulmonary emphysema, unspecified emphysema type (HCC)  -You to have associated emphysema but this is the much minor component because fev1/fvc ratio always > 70 - curently stable  Plan - Continue Spiriva Respimat samples 2.5 mcg dose and take it 2 puffs once daily  WHO-3 pulmonary hypertension - new diagnosis 06/16/22; started tyvaso 07/12/22  Doing well  Plan  - - continue tyvaso dpi as before  - sending message to [facility] RPH to ensure no insurance issues with DPI  Diarrhea due to Ofev  Plan  - Take CAROB Flour as follows - by this [employer] or [employer] Take 1 DESSERT spoon  size serving [approximately 7 g] before breakfast If still no response in 3 days then add another 7 g at dinner If still no response in 3 days then make it to spoon servings at breakfast and 2 spoon servings at dinner and hold    - Followup - 16-20  weeks - ROV Dr Marchelle Gearing - 30 min vsiit after spiro/dlco

## 2023-03-15 NOTE — Progress Notes (Signed)
OV 02/22/2016  Chief Complaint  Patient presents with   Pulmonary Consult    Pt referred by Dr.Byrum for ILD. Pt states he feels he is still recovering from the lung biopsy. Pt denies cough and CP/tightness.     2nd opinionand transfer of care for ILD/UIP - IPF from DR Byrum. Presents with his wife. They related to Dr. Jens Som cardiologist   67 year old male found to have crackles on physical exam and subsequently surgical lung biopsy 02/02/2016 showed UIP as read by pathologist in [place].  [employer] of chest physicians interstitial lung disease questionnaire  Symptoms: He does not cough and is not troubled by shortness of breath except with strenuous exercise. Onset is only "recently" Past medical history: Positive for chronic sinus drainage mild. Recently after surgical lung biopsy was being a few bone unintentional weight loss. Otherwise negative Personal exposure history: He smoked occasional cigarettes having grown up on her tobacco farm starting at 71 including when he was age 58.  Family history: All the male members in his family who smoke developed COPD but also did not smoke to the pulmonary fibrosis especially his father and his paternal uncles. His dad was diagnosed with pulmonary fibrosis at age of 72 in 04-05-02 and died from progressive hypoxemic respiratory failure after what sounds like an IPF flare in 05-Apr-2006.  Home exposure history: He is lived in the same house since 1988/04/05. The home does not have a humidifier or sound or hot tub or Jacuzzi. He is not exposed to any birds. 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 Approximately 30 years of work ending 4 years ago used to cut through dry walls and was exposed to concrete dust and drywall dust and possibly asbestos. HE is to  cut through insulation safe. Denies any farm work, Pension scheme manager was Patent examiner or being a Chiropractor. Did not work in a minor quarry a Theatre manager on [employer]. Denies any exposure to birds of feathers  Autoimmune history: Is negative.  History of exposure to pulmonary toxic drugs: Negative   Lab work - April 2017 and November 2017 extensive autoimmune and vascular this panel is negative  - High resolution CT scan of the chest August 2017: UIP pattern that is classic - personally visualized. There is also 5 mm right lower lobe lung nodule and a 5.9 cm left-sided lower bulla [stable compared to April 2017]   Severity - Pulmonary function test 08/02/2015: Isolated reduction in diffusion capacity 17.33/61%. Otherwise FVC 4 L/93% and normal. Total lung capacity 5.9 L/91% and normal - Walking desaturation test 185 feet 3 laps on room air in the office 02/22/2016: Did not desaturate but gets symptomatic   Emotional state: He is extremely worried about the diagnosis of IPF. This is particularly so because he has a strong family history and his father died within 4 years usually was diagnosed much order at age 67. Patient is worried that he got diagnosed with the same problem and much younger age of 67 and feels that he only will have a few years to live. Multiple times during the visit he was teary-eyed   OV 07/21/2016  Chief Complaint  Patient presents with   Follow-up  Pt denies change in breathing since last OV. Pt denies cough and SOB and CP/tightness.      Follow-up idiopathic pulmonary fibrosis familial variety  He presents with his wife. Overall he is stable. 4 routine activities of daily living and mowing the yard he does not have any shortness of breath or cough. He did find the mountains recently been quite dyspneic. This is worse than June 2017 with for the diagnosis of IPF. Overall he is tolerating the Ofev just  fine except occasionally he has some GI discomfort.He is questions about research trials, exercise, pulmonary fibrosis foundation and family genetics.he is quite active and diffuse pulmonary rehabilitation is easy for him. He is willing to go to [facility] and work on aerobic strengthening and muscle conditioning  Notice that she's not had pulmonary function testing since May 2017    has a past medical history of Dyspnea; Hyperlipemia; and Pneumonia.   reports that he has never smoked. He has never used smokeless tobacco.  OV 10/25/2016  Chief Complaint  Patient presents with   Follow-up    Pt here after PFT. Pt states his breathing is unchanged. Pt c/o PND and dry cough. Pt denies f/c/s and CP/tightenss.    Follow-up mild idiopathic pulmonary fibrosis he's on Ofev since  NOv 2017  Here with his wife. Overall he is stable. His pulmonary function tests and CT scan of the chest shows continued stability for 1 year. Symptom-wise also he is stable. His dyspnea when doing mountains. He is kayaking and biking and is able to outpace his wife. During this time he does not check his pulse oximetry. He does not have one. I've advised that he can get one. He is asking about familial issues, research and also prognosis we went over all this. Of note CT scan of the chest shows also he has concomitant emphysema. He prefers to avoid medications but he is interested in taking an inhaler if it means improving his dyspnea. He is active with pulmonary fibrosis foundation patient support groupo. Of note he reports easy bruising in his forearm when he bumps into something. His wife thinks it's been there all along and is unchanged. He is wondering if that this is related to Ofev and asa. We did discuss about the bleeding risk of Ofev and the need to hold this before any procedure. Overall he tolerates Ofev just fine except for occasional diarrhea once a month. This is associated with fiber food    OV   02/26/2017  Chief Complaint  Patient presents with   Follow-up    PFT done today. Pt still taking OFEV and doing good on it. Denies any complaints with cough, SOB, or CP.    Follow-up idiopathic pulmonary fibrosis on Nintendanib since November 2017  This time his wife is not here with him.  He has now been on nintedanib for a year.  Overall he is tolerating it fine except for mild occasional diarrhea.  He continues to be very active working out in the yard and doing daily walks with a few miles.  He notices only mild dyspnea with this relieved by rest.  He does not feel his health has declined in the last 1 year.  He does have concomitant emphysema on the CT scan but not apparent on the PFTs.  I asked him to try Spiriva last time but it did not help.  He is dissatisfied with occasional albuterol use.  He did have pulmonary function test today which is documented  below.  The FVC is stable but there is a change in the DLCO but he is not feeling it.  He is on IPF registry by [facility];  he is due for a study visit today.  He is interested in further studies especially [place].  We discussed this in detail brief.  He has had genetics testing he got the result.  He is worried insurance will deny this.  He is participating in the pulmonary fibrosis foundation patient support group.  He is asking details about the March 17, 2017 local [place]   OV 05/22/2017  Chief Complaint  Patient presents with   Follow-up    PFT done today.  Pt stated he had the flu 05/05/17 and was not able to eat on 05/06/17 so he missed two OFEV doses. States he has had diarrhea a couple times since then but is now doing fine with help from immodium.  States his breathing has been doing good.      Follow-up idiopathic pulmonary fibrosis on Nintendanib since November 2017   67 year old male with idiopathic pulmonary fibrosis.  He is on stable dose of nintedanib.  He tells me earlier this month he had stomach flu and that he suddenly  had subjective sense of fever associated with diarrhea and vomiting.  This resolved in 2 days.  Since then he is lost a couple of pounds of weight.  He is worried about tolerating the nintedanib.  In addition primary function test shows a 3% decline in FVC and 2 years and a 12% decline in DLCO in 2 years.  He is now wondering if the drug is effective.  Overall other than the recent illness and occasional mild diarrhea he is tolerated off of just fine.  But he is now worried about the tolerance.  He has been advised by [facility] nursing support team about taking protein with nintedanib to offset side effects and he has questions about this.   OV 08/14/2017  Chief Complaint  Patient presents with   Follow-up    PFT done today. Pt states he has been doing good since last visit and denies any complaints.     Follow-up idiopathic pulmonary fibrosis surgical lung biopsy diagnosis February 02, 2016 on Nintendanib since November 2017  Mr. Hillers continues to do well.  At the time of last visit there was concern about weight loss and also progression in lung function.  However at that time he had a respiratory infection.  Today he comes back and tells me that he has gained 1 pound of weight.  His respiratory symptoms have resolved he is back to being at a stable mild exertional dyspnea.  He is extremely active.  Concordant with his symptom improvement his FVC has improved and his DLCO is stable.  Overall his pulmonary function test is stable since November 2018 at least with a DLCO although the DLCO has progressed since 2017.  His FVC has been stable all along since May 2017.  He is not interested in the [place] research study because of the oral pill and can make his diarrhea potentially worse if he gets randomized to the drug.  He is interested in an upcoming inhaler trial of IPF.  He is active in the support group and is asking about the next meeting.  He is not on oxygen.        Ct Chest High  Resolution  Result Date: 10/25/2016 CLINICAL DATA:  Interstitial lung disease, former smoker, cough. Lung biopsy November 2017. EXAM:  CT CHEST WITHOUT CONTRAST TECHNIQUE: Multidetector CT imaging of the chest was performed following the standard protocol without intravenous contrast. High resolution imaging of the lungs, as well as inspiratory and expiratory imaging, was performed. COMPARISON:  11/23/2015 and 07/08/2015. FINDINGS: Cardiovascular: Vascular structures are unremarkable. Heart size normal. No pericardial effusion. Mediastinum/Nodes: Mediastinal lymph nodes are not enlarged by CT size criteria. Hilar regions are difficult to evaluate without IV contrast. No axillary adenopathy. Esophagus is grossly unremarkable. Lungs/Pleura: Centrilobular and paraseptal emphysema with a large bullous lesion in the left lower lobe. Post biopsy changes in the right upper, right middle and right lower lobes. Superimposed basilar predominant subpleural reticulation, ground-glass and traction bronchiectasis/bronchiolectasis, likely stable from baseline examination of 07/08/2015 when differences in slice collimation are considered. There may be scattered honeycombing. Mild subpleural nodularity along the right major fissure is unchanged from 07/08/2015, indicative of subpleural lymph nodes. No pleural fluid. Airway is unremarkable. Upper Abdomen: Visualized portions of the liver and gallbladder are unremarkable. Mild nodular thickening of both adrenal glands. Visualized portion of the right kidney is unremarkable. 2.5 cm low-attenuation lesion in the left kidney is likely a cyst. Visualized portions of the spleen, pancreas, stomach and bowel are grossly unremarkable. No upper abdominal adenopathy. Musculoskeletal: No worrisome lytic or sclerotic lesions. Degenerative changes in the spine. IMPRESSION: 1. Pulmonary parenchymal pattern of fibrosis is likely stable from 07/08/2015 when differences in slice collimation are  considered. Findings are in keeping with the pathologic diagnosis of usual interstitial pneumonitis. 2.  Emphysema (ICD10-J43.9). Electronically Signed   By: Leanna Battles M.D.   On: 10/25/2016 08:23   OV 02/26/2018  Subjective:  Patient ID: Shaun [name], male , DOB: January 26, 1956 , age 13 y.o. , MRN: [id number] , ADDRESS: [address] [place] [address]   02/26/2018 -   Chief Complaint  Patient presents with   Follow-up    breathing well     HPI MACEY WEARING 67 y.o. -follow-up familial IPF.  Last seen in May 2019.  After that in July 2019 he is screened for the electric face to inhaler study but he screen failed because he is standard of care CT scan of the chest from July 2018 was not per sponsored protocol or 2018 ATS protocol.  At this point in time he continues on nintedanib.  He is not taking proton pump inhibitor and wants to know if that is okay.  He feels he is stable.  There are no new issues.  The main thing is with nintedanib once a month he will have sporadic diarrhea.  He says he has been unable to control this but from a respiratory standpoint he feels he is stable.  There are no new medical issues.  He is up-to-date with his vaccines although immunization record review shows that he could benefit from Prevnar 13 which she has not had.  He continues to be actively engaged in the patient support group.   ROS - per HPI    OV 04/29/2018  Subjective:  Patient ID: Shaun [name], male , DOB: 19-Apr-1955 , age 26 y.o. , MRN: [id number] , ADDRESS: [address] [place] [address]   04/29/2018 -   Chief Complaint  Patient presents with   Follow-up    PFT performed today.  Pt states he is about the same since last visit.      HPI MALEAK MCNEVIN 67 y.o. -returns for IPF follow-up.  He is on nintedanib.  He reports continued stability.  He  still has ongoing occasional issue with diarrhea that happened suddenly every few weeks.  It is so random.  It bothers  him and does affect his quality of life.  He takes Imodium in response.  We did discuss about taking Imodium proactively once a week.  He is now to give this a try.  He continues to be interested in pulmonary reserve study particularly face to inhaler study sponsored by [employer].  Our research department is getting ready and probably should be able to screen him in the next few to several weeks.  His last pulmonary function test was today and shows continued stability.  Last liver function test was November 2019 and was normal.  His walk test today is baseline     ROS   OV 11/29/2018  Subjective:  Patient ID: Shaun [name], male , DOB: 1955-08-27 , age 43 y.o. , MRN: [id number] , ADDRESS: [address] [place] [address]   11/29/2018 -   Chief Complaint  Patient presents with   IPF (idiopathic pulmonary fibrosis)    No issues, states medication is working for him.   Follow-up idiopathic pulmonary fibrosis on Nintendanib since November 2017  HPI KAMAEHU OSMANSKI 67 y.o. -presents for follow-up of IPF.  Last visit was early in 2020.  And then the pandemic care.  He try to get down an inhaler trial of phase 2 for IPF - [place] but he screen failed because of technical issues with the spirometry.  He says overall he is doing well.  He is just following low risk activities.  He has a christening coming up for his grandson.  This will be outdoors in [place].  A total of 12 people will attend.  Everybody will be Masked.  He continues taking nintedanib and does not have any issues tolerating it.  Last liver function test was over 6 months ago.  He needs another one now.  He is also asking for a flu shot.  His symptom score and walking desaturation test showed continued stability.  OV 07/15/2019  Subjective:  Patient ID: Shaun [name], male , DOB: 1955/05/26 , age 55 y.o. , MRN: [id number] , ADDRESS: [address] [place] [address]   07/15/2019 -   Chief Complaint  Patient  presents with   Follow-up    Follow-up idiopathic pulmonary fibrosis on Nintendanib since November 2017   HPI KAEDYN HOWLE 67 y.o. -presents for IPF follow-up.  Last seen August 2020.  This visit was supposed to have a pulmonary function test but this was not scheduled.  Patient overall feels stable.  Dyspnea is stable and minimal.  He walks on a regular basis 45 minutes.  He tolerates nintedanib but has mild symptoms.  He is asking about going to a lower dose but tells me that if the drug has been beneficial he says he can manage with the current symptoms.  He says he has had the COVID-19 vaccine at this point.  Last second dose was 1 week ago.  He is in the IPF pro registry.  He is not on any interventional trial.  He screen fell for an inhaler trial but that has been withdrawn because of safety reasons.  He was never a participant in the trial.  He is interested in other trials.  He reported that he is going to be 67 years old and will become a Medicare recipient in the next several months.  He is going to look for insurance options.  Walking desaturation test was stable.  He is okay waiting till the next visit for spirometry.     OV 01/14/2020   Subjective:  Patient ID: Shaun [name], male , DOB: 25-Feb-1956, age 77 y.o. years. , MRN: [id number],  ADDRESS: [address] [place] [address] PCP  Lupita Raider, MD Providers : Treatment Team:  Attending Provider: Kalman Shan, MD Patient Care Team: Lupita Raider, MD as PCP - General (Family Medicine)    Chief Complaint  Patient presents with   Follow-up    PFT performed 10/12.  Pt states he has been doing okay since last visit. States that he is still having problems with diarrhea which he believes has become worse.    IPF diagnosed in November 2017.   HPI JERMICHEAL ERTMAN 67 y.o. -returns for follow-up of his pulmonary fibrosis IPF.  He is coming up for years since diagnosis.  Overall he reports that the  stability in his symptoms see below.  He tells me that when he goes for a 35/45-minute walk he has difficulty with inclines.  He did say this is overall stable but may be compared to a year or 2 ago it is slightly worse.  Review of his pulmonary function test shows compared to 2017 there are some decline in both FVC and DLCO and compared to early 2020 there is slight decline in DLCO although compared to most recent numbers his DLCO is better.  His weight is stable.  Overall he is tolerating the nintedanib fine but he is having diarrhea.  The diarrhea is uncomfortable but nevertheless is only 2 episodes a week.  Last visit I advised him to take Imodium once a week on a scheduled basis but he says he prefers not to take medicine and he takes it as needed.  If this is then restarting to him using Imodium twice a week.  We discussed and is willing to try once a week schedule.  The other alternative he wanted to discuss was reducing the nintedanib to 100 mg twice daily.  I cautioned against this given the fact his lungs have had only a slight progression nevertheless there is progression and with the lower dose of nintedanib may be it is less effective.  Therefore is going to try the Imodium route.  He has upcoming colonoscopy because of his diarrhea.  We advised the to stop the nintedanib 2 days before and 1-2 days after the procedure.  Similarly for dental procedure he will stop the nintedanib 2 days before and resume 1 or 2 days after the procedure.   He is interested in research protocol.  We discussed several studies.  He is not interested in the Phase 2 [place] study.  We discussed promedior IV infusion study.  He wants to think about this.  We will send in the consent form.  This upcoming study with an inhaler that is already preapproved in the market for pulmonary hypertension.  Is called treprostinil.  If we get the study then he might be interested in this.   OV 12/22/2020  Subjective:  Patient ID:  Shaun [name], male , DOB: 1956-03-24 , age 18 y.o. , MRN: [id number] , ADDRESS: [address] [place] [facility] [address] PCP Lupita Raider, MD Patient Care Team: Lupita Raider, MD as PCP - General (Family Medicine)  This Provider for this visit: Treatment Team:  Attending Provider: Kalman Shan, MD    12/22/2020 -   Chief Complaint  Patient presents with   Follow-up  PFT performed today. Pt states he is about the same since last visit. Currently has some problems with sinus drainage.    IPF diagnosed in November 2017.  Family of pulmonary fibrosis - last CT March 2020 - IPF-Pro registre HPI LILIANA BIBY 67 y.o. -returns for follow-up.  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  Overall he is doing stable.  He is adjusted his diet and his diarrhea with nintedanib is under much better control.  His weight is stable.  His symptom scores are stable.  Side effect from diarrhea improved.  He had a 6-minute walk test and this was normal.  He had liver function test today and it is normal.  He had questions about his prognosis.  I explained that given stability of ILD that more than likely is going to be stable in the next 1 year and beyond that it is difficult to predict.  This is based on Calculator.  We looked at his trend line with the FVC and DLCO.  His DLCO this is improved compared to last year.  Therefore overall it appears that he had a decline between 2017 and 2018 but since late 2018 he has been stable till the current time September 2022.  He is always been interested in clinical trials as a care option.  He was given the option of inhaler treprostinil study called [facility].Marland Kitchen  He did not want to do oral drug study.  He has reviewed the consent form he is not interested in this because he has to take the inhaler 4 times each day and more than 9 times each time.  We discussed IV recombinant Pentraxin with goodphase 2 to safety  and efficacy data .  I shorten the publication images of the data on this.  I have given him that.  Also give him a detailed consent form forhis for review.  He will read and reflect on this.          07/25/2021 Follow up : IPF  Patient presents for a 35-month follow-up.  Patient has underlying IPF.  Patient says overall breathing is doing about the same.  He gets short of breath with heavy activities but tries to remain active every day.  Typically walks about 30 to 40 minutes every day.  Recently was able to go hiking but did notice that when he was going up an incline/he will he did get some shortness of breath and had to rest.  Patient says his weight has been stable.  Patient remains on Ofev.  Patient says his chronic diarrhea has been managed with Imodium.  Typically is only have to take it about every 3 days or so.  And only takes a half a tablet.  He feels that this has become under better control. Patient says he does check his oxygen levels at home.  Has not noticed any lower oxygen levels. PFTs done on July 12, 2021 are essentially stable.  FEV1 at 108%, ratio 80, FVC 100%, DLCO 62%.  This is very similar to his lung function testing in March 2022. Patient is talking to [employer] research team  and has been referred for  the ILD pro registry.     OV 01/03/2022  Subjective:  Patient ID: Shaun [name], male , DOB: 07-14-1955 , age 15 y.o. , MRN: [id number] , ADDRESS: [address] [place] [address] PCP Lupita Raider, MD Patient Care Team: Lupita Raider, MD as PCP -  General (Family Medicine)  This Provider for this visit: Treatment Team:  Attending Provider: Kalman Shan, MD    01/03/2022 -   Chief Complaint  Patient presents with   Follow-up    Review PFT's. Breathing is slightly worse. He gets winded walking up an incline. He is walking approx 2 miles per day. He would like a flu vaccine today.     HPI BRYSE BILA 67 y.o. -returns for follow-up.   I personally not seen him in a year.  From a respiratory symptom standpoint he is stable as can be seen below.  His weight is also stable.  He marked mild for diarrhea but this is with Imodium.  He says he is worried about taking Imodium every other day.  His diet is actually more than what is listed.  He says this week and he had to go to [place] to see his grandchildren and he had to stop.  He says the diarrhea is unpredictable.  Therefore he is asking if he can go to the low-dose of nintedanib.  We discussed 4  options including taking a holiday and then going to the low-dose versus taking a holiday from nintedanib and going to the full dose versus itching to pirfenidone versus enrolling in a clinical trial involving due to deutrified pirfenidone.  We weighed all the options.  I personally recommended taking a holiday and then rechallenging himself with nintedanib.  We took a shared decision making to go with a low-dose.  He is aware the low-dose might not be as efficacious but given his relative stability over time he is willing to do that.  He is simply does not want to do the high-dose because of the diarrhea.  Of note he is in the IPF-Pro registry and he will participate in that today.  He will have a high-dose flu shot today as well.  Perfect that all       OV 03/21/2022  Subjective:  Patient ID: Shaun [name], male , DOB: 11/19/1955 , age 74 y.o. , MRN: [id number] , ADDRESS: [address] [place] [address] PCP Lupita Raider, MD Patient Care Team: Lupita Raider, MD as PCP - General (Family Medicine)  This Provider for this visit: Treatment Team:  Attending Provider: Kalman Shan, MD    03/21/2022 -   Chief Complaint  Patient presents with   Follow-up    Pt states he has been doing okay since last visit and denies any real complaints.     HPI HART BART 67 y.o. -presents for follow-up.  Since his last visit around Thanksgiving 2023 picked up sinus  infection and is having a lot of clearing of the throat and sinus drainage.  He tells me that he is got constant sinus drainage that is perennial.  At the last visit because of significant diarrhea he stopped his nintedanib.  I told him to take an interim holiday and restarted on the low-dose protocol.  He is now 100 mg twice daily for the last 3-4 weeks.  His diarrhea has returned but it is nowhere as severe as the past.  He has had 2 or 3 bouts only.  He is also wondering if it is because of the recent respiratory infection that he had.  He states he is on the mend.  He did have colored sputum but right now is all clear but he does have cough more than baseline and sinus drainage more than baseline.  There is no wheezing.  He  is open to trying short course of prednisone.  He did have a CT scan of the chest that shows significant progression.  He is now noticing more difficulty doing hills when he goes for his daily walks.  Symptom score shows worsening.  He has significant emphysema and this might exclude him from some studies even though his FEV1 FVC ratio is normal or greater than 70  We also discussed [employer] DEXA bone scan study for the study of osteoporosis and osteopenia in patients with IPF.  He is willing to go through this.  I have emailed him a consent form.0 0  0 CT Chest data - HRCT 03/13/22  Narrative & Impression  CLINICAL DATA:  Follow-up IPF, interstitial lung disease, history of biopsy   EXAM: CT CHEST WITHOUT CONTRAST   TECHNIQUE: Multidetector CT imaging of the chest was performed following the standard protocol without intravenous contrast. High resolution imaging of the lungs, as well as inspiratory and expiratory imaging, was performed.   RADIATION DOSE REDUCTION: This exam was performed according to the departmental dose-optimization program which includes automated exposure control, adjustment of the mA and/or kV according to patient size and/or use of  iterative reconstruction technique.   COMPARISON:  06/10/2018, 10/24/2016, 11/23/2015, 07/08/2015   FINDINGS: Cardiovascular: Scattered aortic atherosclerosis. Normal heart size. No pericardial effusion.   Mediastinum/Nodes: Unchanged prominent mediastinal lymph nodes. Thyroid gland, trachea, and esophagus demonstrate no significant findings.   Lungs/Pleura: Severe pulmonary fibrosis in a pattern with apical to basal gradient featuring irregular peripheral interstitial opacity, septal thickening, traction bronchiectasis, and large areas of honeycombing at the lung bases. Fibrotic findings are slightly worsened in comparison to most recent examination dated 06/10/2018 and clearly worsened over a longer period of time dating back to 2017. Moderate underlying centrilobular and paraseptal emphysema. No pleural effusion or pneumothorax.   Upper Abdomen: No acute abnormality.   Musculoskeletal: No chest wall abnormality. No acute osseous findings.   IMPRESSION: 1. Severe pulmonary fibrosis in a pattern with apical to basal gradient featuring irregular peripheral interstitial opacity, septal thickening, traction bronchiectasis, and large areas of honeycombing at the lung bases. Fibrotic findings are slightly worsened in comparison to most recent examination dated 06/10/2018 and clearly worsened over a longer period of time dating back to 2017. Findings remain consistent with UIP per consensus guidelines: Diagnosis of Idiopathic Pulmonary Fibrosis: An Official ATS/ERS/JRS/ALAT Clinical Practice Guideline. Am Rosezetta Schlatter Crit Care Med Vol 198, Iss 5, [phone], Dec 02 2016. 2. Moderate underlying emphysema. 3. Unchanged prominent mediastinal lymph nodes, likely reactive.   Aortic Atherosclerosis (ICD10-I70.0) and Emphysema (ICD10-J43.9).     Electronically Signed   By: Jearld Lesch M.D.   On: 03/13/2022 16:19    No results found.   OV 05/15/2022  Subjective:  Patient ID:  Shaun [name], male , DOB: Nov 12, 1955 , age 8 y.o. , MRN: [id number] , ADDRESS: [address] [place] [address] PCP Lupita Raider, MD Patient Care Team: Lupita Raider, MD as PCP - General (Family Medicine)  This Provider for this visit: Treatment Team:  Attending Provider: Kalman Shan, MD    05/15/2022 -   Chief Complaint  Patient presents with   Follow-up    More SOB with walking up hills.  Not using albuterol MDI.  No difference when used Spiriva    HPI EMEAL MAILHOT 67 y.o. -returns for follow-up.  Initially said he is stable but his pulmonary function test shows persistent decline since April 2023 and slight decline since September 2023.  This is consistent with progression of fibrosis on the December 2023 CT scan.  Walking desaturation test showed pulse ox dropping to 89%.  He then admitted that while doing daily walks walking up hills and all that is more difficult.  He feels that he is getting worse since the fall 2023 when he switched to low-dose nintedanib.  In fact his symptom scores are gone from a dyspnea score of 1-3 and this started around the time he switched to the low-dose nintedanib.  He is wondering if this could be the reason I did acknowledge that low-dose is less effective.  After years of being stable he started having side effects with the full dose nintedanib and therefore the switch was made.  We are quite concerned about disease progression.  The other possibility is onset of pulmonary hypertension.  Currently inhaled treprostinil is approved for WHO group 3 pulmonary hypertension related to ILD.  This is be a standard of care treatment.  Therefore to evaluate this he would need echocardiogram and/or  right heart catheterization..  Previously his BNP is negative.  We discussed getting workup done for pulmonary hypertension.  He is agreeable to this.  There are 2 options he can go through insurance protocol or try to qualify for research  protocol sponsored by [employer] called PHINDER study.  We gave him a copy of the consent.  He later emailed me saying that he is willing to start go going to the research protocol route.  If she screen fails then we will talk to cardiology and try to arrange standard of care right heart catheterization.  If he does not have pulmonary hypertension then we would discuss clinical trials as a care option in the setting of progressive disease.        OV 3?125/24   Subjective:  Patient ID: Shaun [name], male , DOB: [birth year] , age 41 y.o. , MRN: [id number] , ADDRESS: [address] [place] [address] PCP Lupita Raider, MD Patient Care Team: Lupita Raider, MD as PCP - General (Family Medicine) Hunsucker, Lesia Sago, MD as Consulting Physician (Pulmonary Disease)  This Provider for this visit: Treatment Team:  Attending Provider: Kalman Shan, MD   07/04/2022 -   Chief Complaint  Patient presents with   Follow-up    Pt states he is feeling the same. RHC 3/15     HPI FELIS HOGANSON 67 y.o. -returns for follow-up to discuss his right heart catheterization.  In the interim he tried high-dose full dose nintedanib but he could not tolerate it.  Diarrhea came back.  So he is back in low-dose.  He almost quit taking nintedanib but decided to do low-dose and is tolerating this fine.  He also had right heart catheterization 06/16/2022 and it shows mild pulmonary hypertension.  He does meet increase study criteria for inhaled treprostinil.  We went over in detail both treprostinil nebulizer and DPI.  Decided to go with nebulizer as first choice so it is easily titratable.  Discussed side effect profile and anecdotal experience of 20% intolerance.  Discussed the benefits of improved walk distance and dyspnea and potential signal in modulation of IPF.  He is willing to try this.  He will need to do paperwork for this.  Today is a discussion only visit.  Of note he did have  vitamin D deficiency and I prescribed vitamin D supplements for him.  Discussed a lot of nausea therefore he stopped it.  He is willing to get his  vitamin D level rechecked avoid toxicity.     RHC 06/16/22 Hemodynamics (mmHg) RA mean 2 RV 38/4 PA 47/13, mean 26 PCWP mean 7  Oxygen saturations: PA 78% AO 94%  Cardiac Output (Fick) 6.11  Cardiac Index (Fick) 3.57 PVR 3.1 WU  0  OV 08/08/2022  Subjective:  Patient ID: Shaun [name], male , DOB: Feb 07, 1956 , age 89 y.o. , MRN: [id number] , ADDRESS: [address] [place] [address] PCP Lupita Raider, MD Patient Care Team: Lupita Raider, MD as PCP - General (Family Medicine) Hunsucker, Lesia Sago, MD as Consulting Physician (Pulmonary Disease)  This Provider for this visit: Treatment Team:  Attending Provider: Kalman Shan, MD    08/08/2022 -   Chief Complaint  Patient presents with   Follow-up    F/up on IPF   HPI Shaun [name] 67 y.o. -returns for follow-up.  He started inhaled treprostinil nebulizer 07/12/2022.  Currently taking 4 times a day each time 6 times.  No side effect so far.  Plans to escalate.  He finds the nebulizer inconvenient and wants to switch to the DPI.  I advised him to wait another 4 to 6 weeks.  And to call us and if he is tolerating well at a minimum 9 times nebulizer each time 4 times daily then we can consider switch to DPI.  He plans to go to [place] for his annual summer trip but has postponed this to July 2024 and wants to make this decision at least in the next 4 to 6 weeks.  Supported him on this.  He is tolerating low-dose nintedanib 100 mg twice daily.  Last liver function test February he needs another liver function test today.  He continues to Spiriva.  He feels his dyspnea is improved although the dyspnea score itself is the same.  He feels he is getting some sinusitis because of the nebulizer he still has some green discharge.  He agreed to take doxycycline.  He also  reported anxiety because of his illness.  Wanted to know his life expectancy wanted to know if he could live another 10 years.  I did indicate to him that it was reasonable to expect living another 10 years with this disease.  Depends on some amount of LOC but also the benefit of therapeutics and new advances.  Did discuss transplant was an option.  Rated his current disease as moderate but with progression heading to severe.  But the progression is slow.  Appears that treprostinil is helping him because he is feeling better and he did not desaturate with exertion today [which she did last time].  He deferred referral to [facility] transplant evaluation but he said he will talk to his neighbor who has had a lung transplant.  He is reporting reactive anxiety and depression.  We discussed this and he agreed to be referred to psychology.   OV 11/14/2022  Subjective:  Patient ID: Shaun [name], male , DOB: January 07, 1956 , age 89 y.o. , MRN: [id number] , ADDRESS: [address] [place] [address] PCP Lupita Raider, MD Patient Care Team: Lupita Raider, MD as PCP - General (Family Medicine) Hunsucker, Lesia Sago, MD as Consulting Physician (Pulmonary Disease)  This Provider for this visit: Treatment Team:  Attending Provider: Kalman Shan, MD    11/14/2022 -   Chief Complaint  Patient presents with   Follow-up    F/up on PFT     HPI Shaun [name] 67 y.o. -he is now on  Tyvaso DPI.  As of September 27, 2022 pharmacist noted, "Patient is titrating to Tyvaso DPI four times daily on 09/27/22. Patient is scheduled for in person visit today for DPI training .  Since then he has been on DPI.  He is able to tolerate only 48 mcg 4 times daily.  He says when he went to the nebulizer at 10 times it made him feel uneasy.  He has no side effects of the current DPI dose.  Is also taking nintedanib low-dose protocol.  He says the combination is really helped him he is feeling less short of  breath.  Symptom scores below show improvement in shortness of breath.  No side effects from the treatment.  His pulmonary function test was also stabilized since he started inhaled treprostinil.  He has about respiratory vaccines and I recommended flu shot and updated COVID-vaccine.      OV 03/15/2023  Subjective:  Patient ID: Shaun [name], male , DOB: [birth year] , age 15 y.o. , MRN: [id number] , ADDRESS: [address] [place] [address] PCP Lupita Raider, MD Patient Care Team: Lupita Raider, MD as PCP - General (Family Medicine) Hunsucker, Lesia Sago, MD as Consulting Physician (Pulmonary Disease)  This Provider for this visit: Treatment Team:  Attending Provider: Kalman Shan, MD    03/15/2023 -   Chief Complaint  Patient presents with   Follow-up    PFT done. Breathing is stable.     Emphysma = alpha-1 MM   IPF diagnosed in November 2017.   - Family of pulmonary fibrosis - last CT March 2020  - emphysema > Fibrosis -Repeat CT scan of the chest December 2023: With progression and significant emphysema present. - IPF-Pro registre -Low-dose nintedanib since Oct 2023 due to diarrhea  Combined pulmonary fibrosis and emphysema - with fibrosis much biggter component and epysema only mild due to fev/55fc fation > 70 at all time  WHO group 3 pulmonary hypertension diagnosed 06/16/2022: Mean PAP 26, PCWP 7, PVR 3.1 -> tuyvaso nebs 07/12/22 -> changed to DPI at 40 mcg dose in July 2024    Saw counselor r September 12, 2022 for adjustment disorder with anxious mood.  HPI PETIE RUDLOFF 67 y.o. -returns for follow-up.  Since his last visit Interim Health status: No new complaints No new medical problems. No new surgeries. No ER visits. No Urgent care visits. No changes to medications other than the fact he is not using Tyvaso DPI.  He is only able to tolerate 48 mcg dose.  He is unable to recall what side effect he had with the nebulizer past 9 puffs each time.  He  is liking the DPI but he is worried that it is a nonformulary for insurance and he will have to pay heavily from 2024 onwards.  He already in October 2024 had a conversation with the pharmacist.  I reviewed the record.  However he wante me to reach out to the pharmacy team which have done.  He continues to be on nintedanib at low-dose.  His diarrhea is much better on the lower dose but he still has occasional significant amount of diarrhea.  I discussed literature showing CAROB flour to be beneficial in the interim related diarrhea.  He is willing to try this.  He is up-to-date with his vaccines.  He had pulmonary function test today shows continued stability ever since he went on time also.  I did review these results with him and showed him the results.  He did do a sit/stand exercise hypoxemia test and his pulse ox held up.      SYMPTOM SCALE - ILD 8/28/202 0 140# 07/15/2019  01/14/2020 138# 12/22/2020 138# 01/03/2022 142# 03/21/2022 144# low dose ofev since oct- nov 2023 05/15/2022 Low dose ofev 06/26/22 08/08/2022 Low dose ofev + tyvaso since 07/12/22 11/14/2022 Low dose ofev + tyvasos dpi 03/15/2023   O2 use Room air   ra ra ra ra ra ra ra Room air  Shortness of Breath 0 -> 5 scale with 5 being worst (score 6 If unable to do)            At rest 0 0 0 0 0 0 0 0 0 0    Simple tasks - showers, clothes change, eating, shaving 0 0 0 0 0 0 0 0 00 0    Household (dishes, doing bed, laundry) 0 0  0 0  0 0 0 0   Shopping 0 0 0 0 0 00 0 0 0 0    Walkin sel pac 0 0 00 0 0 1 1 0 1 0    Walking up Stairs 1 1 1 1 1 2 2 2 2 1    Total (40 - 48) Dyspnea Score 1 1 1 1 1 3 3 3 3 1    How bad is your cough? 0 0 1 0 0 0 1 1 2 0    How bad is your fatigue 0 1 1 1 1 1 1 0 1 10    nausea  1 1 0 0 0 0 0 0 0   vomit  0 0 0 0 0 0 0 0 00   diarhea  1 2 1 2 1 1 1 1  0   anxity  1 1 1 1 1 1 1 2  00   depresion  1 1 0 1 1 1  x 2 0      Simple office walk - 250 feet x 3 laps 02/26/2018  04/29/2018  11/29/2018  07/15/2019   03/21/2022   05/15/2022  08/08/2022 Low dose ofev and tyvaso 03/15/2023 Low-dose Ofev and Tyvaso DPI at 48 mcg  O2 used Room air Room air Room air ra ra ra ra   Number laps completed 3 3 3 3 3 3 3  Sit stand x 15  Comments about pace Good brisk         Resting Pulse Ox/HR 100% and 74/min 100% and 86/min     100% and 76/min 100%and 79/min 99% ad HR 75 97% and Hr 98% and HR79 96% and 83/min  Final Pulse Ox/HR 96% and 96/min 97% and 93/min 96% and 99/min 96% and 111/min 93% and HR 100 89% and HR 92% and HR 98 And 93% and 106  Desaturated </= 88% no no no no No      Desaturated <= 3% points yes Yes, 3 Yes, 4 Yes, 4 points Yes 6 point  Yes 4 points   Got Tachycardic >/= 90/min yes yes yes yes yes     Symptoms at end of test No dyspnea No dyspnea  asympt Lot of throast clearning  No dyspnea. better No dyspnea and test results are improved since starting Tyvaso.  Miscellaneous comments x x            PFT     Latest Ref Rng & Units 11/14/2022   11:13 AM 06/13/2022   10:25 AM 05/15/2022    9:10 AM 12/27/2021    9:48 AM 07/12/2021   11:46 AM 12/22/2020  2:13 PM 06/03/2020   11:27 AM  ILD indicators  FVC-Pre L 3.74  3.77  3.69  3.83  3.94  4.03  3.95   FVC-Predicted Pre % 92  95  93  97  100  101  99   TLC L  5.44        TLC Predicted %  86        DLCO uncorrected ml/min/mmHg 11.81  11.57  12.96  12.42  14.62  14.65  15.03   DLCO UNC %Pred % 49  48  54  52  62  62  63   DLCO Corrected ml/min/mmHg 11.81  11.57  12.96  12.42  14.62  14.65  15.03   DLCO COR %Pred % 49  48  54  52  62  62  63       LAB RESULTS last 96 hours No results found.  LAB RESULTS last 90 days No results found for this or any previous visit (from the past 2160 hours).       has a past medical history of Dyspnea, Family history of colon cancer, Family history of prostate cancer, Family history of pulmonary fibrosis, Hyperlipemia, and Pneumonia.   reports that he has never smoked. He has never used smokeless  tobacco.  Past Surgical History:  Procedure Laterality Date   COLONOSCOPY WITH PROPOFOL N/A 09/21/2014   Procedure: COLONOSCOPY WITH PROPOFOL;  Surgeon: Charolett Bumpers, MD;  Location: [facility];  Service: Endoscopy;  Laterality: N/A;   FOREARM SURGERY     excision of birth mark"child"   HERNIA REPAIR     RIH-open   KNEE ARTHROTOMY Left    bursa excision   LUNG BIOPSY Right 02/02/2016   Procedure: LUNG BIOPSY;  Surgeon: Loreli Slot, MD;  Location: [facility] OR;  Service: Thoracic;  Laterality: Right;   NASAL SEPTUM SURGERY     RIGHT HEART CATH N/A 06/16/2022   Procedure: RIGHT HEART CATH;  Surgeon: Laurey Morale, MD;  Location: [facility] INVASIVE CV LAB;  Service: Cardiovascular;  Laterality: N/A;   VIDEO ASSISTED THORACOSCOPY Right 02/02/2016   Procedure: VIDEO ASSISTED THORACOSCOPY;  Surgeon: Loreli Slot, MD;  Location: [facility] OR;  Service: Thoracic;  Laterality: Right;   WRIST GANGLION EXCISION Left     Allergies  Allergen Reactions   Penicillins Other (See Comments)    UNSPECIFIED REACTION OF CHILDHOOD Has patient had a PCN reaction causing immediate rash, facial/tongue/throat swelling, SOB or lightheadedness with hypotension:unsure Has patient had a PCN reaction causing severe rash involving mucus membranes or skin necrosis:unsure Has patient had a PCN reaction that required hospitalization:No Has patient had a PCN reaction occurring within the last 10 years:No If all of the above answers are "NO", then may proceed with Cephalosporin use.     Immunization History  Administered Date(s) Administered   Fluad Quad(high Dose 65+) 12/02/2020, 01/03/2022   Influenza Split 01/13/2011, 01/02/2012, 01/13/2013, 12/01/2013, 12/04/2014, 11/13/2015, 12/09/2016   Influenza,inj,Quad PF,6+ Mos 12/09/2016, 11/26/2017, 11/29/2018, 12/31/2019   Influenza-Unspecified 11/02/2015, 12/09/2016, 12/03/2022   Moderna SARS-COV2 Booster Vaccination 01/31/2020   Moderna Sars-Covid-2 Vaccination  06/05/2019, 07/08/2019, 01/31/2020, 07/05/2020   Pneumococcal Conjugate-13 02/26/2018   Pneumococcal Polysaccharide-23 02/05/2016   Respiratory Syncytial Virus Vaccine,Recomb Aduvanted(Arexvy) 01/10/2022   Td 07/12/2017   Td (Adult) 08/01/1997   Tdap 12/10/2007   Zoster Recombinant(Shingrix) 12/09/2016, 02/10/2017   Zoster, Live 05/05/2015, 11/02/2015, 12/09/2016, 02/10/2017    Family History  Problem Relation Age of Onset   Lung cancer Mother  Colon cancer Mother 26   Pulmonary fibrosis Father        dx in his 81s   Prostate cancer Brother 31   COPD Paternal Grandfather    Other Paternal Uncle        possible lung dx; died around age 29   Other Cousin        possible lung dx, died in her 54s; paternal first cousin   Healthy Son      Current Outpatient Medications:    Chlorpheniramine-Pseudoeph 4-60 MG TABS, Take 1 tablet by mouth 2 (two) times daily as needed (for allergies.)., Disp: , Rfl:    Multiple Vitamin (MULTIVITAMIN WITH MINERALS) TABS tablet, Take 1 tablet by mouth daily., Disp: , Rfl:    Nintedanib (OFEV) 100 MG CAPS, TAKE 1 CAPSULE BY MOUTH 2 TIMES A DAY, Disp: 180 capsule, Rfl: 1   simvastatin (ZOCOR) 10 MG tablet, Take 10 mg by mouth daily at 6 PM. , Disp: , Rfl: 0   Tiotropium Bromide Monohydrate (SPIRIVA RESPIMAT) 2.5 MCG/ACT AERS, Inhale 2 puffs into the lungs daily., Disp: , Rfl:    TYVASO DPI MAINTENANCE KIT 48 MCG POWD, Inhale 48 mcg into the lungs in the morning, at noon, in the evening, and at bedtime., Disp: , Rfl:       Objective:   Vitals:   03/15/23 1532  BP: 130/79  Pulse: 81  Temp: 98 F (36.7 C)  TempSrc: Oral  SpO2: 94%  Weight: 147 lb (66.7 kg)  Height: 5\' 7"  (1.702 m)    Estimated body mass index is 23.02 kg/m as calculated from the following:   Height as of this encounter: 5\' 7"  (1.702 m).   Weight as of this encounter: 147 lb (66.7 kg).  @WEIGHTCHANGE @  [employer]   03/15/23 1532  Weight: 147 lb (66.7 kg)      Physical Exam   General: No distress. Looks well O2 at rest: no Cane present: no Sitting in wheel chair: non Frail: no Obese: no Neuro: Alert and Oriented x 3. GCS 15. Speech normal Psych: Pleasant Resp:  Barrel Chest - no.  Wheeze - no, Crackles - YES, No overt respiratory distress CVS: Normal heart sounds. Murmurs - no Ext: Stigmata of Connective Tissue Disease - no HEENT: Normal upper airway. PEERL +. No post nasal drip        Assessment:       ICD-10-CM   1. IPF (idiopathic pulmonary fibrosis) (HCC)  J84.112 Hepatic function panel    Pulmonary function test    2. Familial idiopathic pulmonary fibrosis (HCC)  J84.112 Hepatic function panel    3. Therapeutic drug monitoring  Z51.81 Hepatic function panel    4. Pulmonary emphysema, unspecified emphysema type (HCC)  J43.9 Hepatic function panel    5. WHO group 3 pulmonary arterial hypertension (HCC)  I27.23 Hepatic function panel         Plan:     Patient Instructions   IPF (idiopathic pulmonary fibrosis) (HCC) Familial idiopathic pulmonary fibrosis (HCC) Therapeutic drug monitoring   - IPF definitely progressive over time slowly but has stabilized [id number] ->  Dec 2024 after adding tyvaso to regimen -Low-dose nintedanib is better tolerated and unable to tolerate full dose anymore due to dharhea  - still with some diarreha   Plan  -Continue ofev low-dose protocol at 100 mg twice daily  - see diarrhea management with CAROB flour below; if this works well we can look at going to full dose ofev - liver  function test 03/15/2023 - spirometry and dlco in 16 weeks - 20 weeks - discussed [facility] transplant eval - defer for now    Pulmonary emphysema, unspecified emphysema type (HCC)  -You to have associated emphysema but this is the much minor component because fev1/fvc ratio always > 70 - curently stable  Plan - Continue Spiriva Respimat samples 2.5 mcg dose and take it 2 puffs once daily  WHO-3  pulmonary hypertension - new diagnosis 06/16/22; started tyvaso 07/12/22  Doing well  Plan  - - continue tyvaso dpi as before  - sending message to [facility] to ensure no insurance issues with DPI  Diarrhea due to Ofev  Plan  - Take CAROB Flour as follows - by this [employer] or [employer] Take 1 DESSERT spoon  size serving [approximately 7 g] before breakfast If still no response in 3 days then add another 7 g at dinner If still no response in 3 days then make it to spoon servings at breakfast and 2 spoon servings at dinner and hold    - Followup - 16-20  weeks - ROV Dr Marchelle Gearing - 30 min vsiit after spiro/dlco   FOLLOWUP Return in about 18 weeks (around 07/19/2023) for 30 min visit, after Cleda Daub and DLCO, Face to Face with Dr Marchelle Gearing, ILD, Pulmonary Hypertension.    SIGNATURE    Dr. Kalman Shan, M.D., F.C.C.P,  Pulmonary and Critical Care Medicine Staff Physician, [facility] Center Director - Interstitial Lung Disease  Program  Pulmonary Fibrosis [facility] at [facility] [place], [place], [address]  Pager: [phone], If no answer or between  15:00h - 7:00h: call [phone] Telephone: [phone]  4:09 PM 03/15/2023   HIGh Complexity  OFFICE   2021 E/M guidelines, first released in 2021, with minor revisions added in 2023. Must meet the requirements for 2 out of 3 dimensions to qualify.    Number and complexity of problems addressed Amount and/or complexity of data reviewed Risk of complications and/or morbidity  Severe exacerbation of chronic illness  Acute or chronic illnesses that may pose a threat to life or bodily function, e.g., multiple trauma, acute MI, pulmonary embolus, severe respiratory distress, progressive rheumatoid arthritis, psychiatric illness with potential threat to self or others, peritonitis, acute renal failure, abrupt change in neurological status Must meet the requirements for 2 of 3 of the  categories)  Category 1: Tests and documents, historian  Any combination of 3 of the following:  Assessment requiring an independent historian  Review of prior external note(s) from each unique source  Review of results of each unique test  Ordering of each unique test    Category 2: Interpretation of tests    Independent interpretation of a test performed by another physician/other qualified health care professional (not separately reported)  Category 3: Discuss management/tests  Discussion of management or test interpretation with external physician/other qualified health care professional/appropriate source (not separately reported)  HIGH risk of morbidity from additional diagnostic testing or treatment Examples only:  Drug therapy requiring intensive monitoring for toxicity  Decision for elective major surgery with identified pateint or procedure risk factors  Decision regarding hospitalization or escalation of level of care  Decision for DNR or to de-escalate care   Parenteral controlled  substances

## 2023-03-15 NOTE — Progress Notes (Signed)
Spirometry/DLCO performed today. 

## 2023-03-15 NOTE — Telephone Encounter (Signed)
Shaun [name]  He is worieed about this DPI being non-formulary (Tyvaso). HE said insurance said I Can write a letter. Can you help me/him here please. I saw that you reasured him some 2 months ago  THanks    SIGNATURE    Dr. Kalman Shan, M.D., F.C.C.P,  Pulmonary and Critical Care Medicine Staff Physician, [facility] Center Director - Interstitial Lung Disease  Program  Pulmonary Fibrosis [facility] at [facility] [place], [place], [address]   Pager: [phone], If no answer  -> Check AMION or Try [phone] Telephone (clinical office): [phone] Telephone (research): [phone]  4:10 PM 03/15/2023

## 2023-03-16 LAB — HEPATIC FUNCTION PANEL
ALT: 17 U/L (ref 0–53)
AST: 28 U/L (ref 0–37)
Albumin: 4.2 g/dL (ref 3.5–5.2)
Alkaline Phosphatase: 64 U/L (ref 39–117)
Bilirubin, Direct: 0.1 mg/dL (ref 0.0–0.3)
Total Bilirubin: 0.4 mg/dL (ref 0.2–1.2)
Total Protein: 6.9 g/dL (ref 6.0–8.3)

## 2023-03-19 NOTE — Telephone Encounter (Signed)
Submitted a Prior Authorization RENEWAL request to [facility] for TYVASO DPI via CoverMyMeds. Will update once we receive a response.  Key: Waylan Rocher, PharmD, MPH, BCPS, CPP Clinical Pharmacist (Rheumatology and Pulmonology)

## 2023-04-27 NOTE — Telephone Encounter (Signed)
PA for Tyvaso DPI renewed.  Submitted a Prior Authorization RENEWAL request to [facility] for TYVASO DPI via CoverMyMeds. Will update once we receive a response.  Key: [facility]

## 2023-05-02 NOTE — Telephone Encounter (Signed)
Received fax from [facility] stating Tyvaso DPI authorization request is duplicate. Was approved on 09/15/2022.  Case # [id number]  Chesley Mires, PharmD, MPH, BCPS, CPP Clinical Pharmacist (Rheumatology and Pulmonology)

## 2023-05-04 ENCOUNTER — Telehealth: Payer: Self-pay | Admitting: Internal Medicine

## 2023-05-04 NOTE — Telephone Encounter (Signed)
Patient needs a refill of Tyvaso DPI is not covered by his insurance. He needs the exemption to be renewed for this year. Please advise [phone]

## 2023-05-11 MED ORDER — TYVASO DPI MAINTENANCE KIT 48 MCG IN POWD: 48.0000 ug | Freq: Four times a day (QID) | RESPIRATORY_TRACT | 5 refills | Status: DC

## 2023-05-11 NOTE — Telephone Encounter (Signed)
 Submitted an URGENT Prior Authorization request to [facility] for TYVASO  DPI via CoverMyMeds. Will update once we receive a response.  Key: [id number]

## 2023-05-14 NOTE — Telephone Encounter (Signed)
 Received fax from [facility] stating that PA request is duplicate. Siegfried Dress is approved from 09/16/2022 through 04/02/2098 The refill is too soon until 05/27/2023  Case # [id number]  Refill was sent to [facility] on 05/11/2023  Geraldene Kleine, PharmD, MPH, BCPS, CPP Clinical Pharmacist (Rheumatology and Pulmonology)

## 2023-06-11 ENCOUNTER — Other Ambulatory Visit: Payer: Self-pay | Admitting: Internal Medicine

## 2023-06-11 DIAGNOSIS — J84112 Idiopathic pulmonary fibrosis: Secondary | ICD-10-CM

## 2023-06-11 DIAGNOSIS — Z5181 Encounter for therapeutic drug level monitoring: Secondary | ICD-10-CM

## 2023-06-12 NOTE — Telephone Encounter (Signed)
 Refill sent for OFEV to [facility] (pulmonary fibrosis team): [phone]  Dose: 100mg  twice daily  Last OV: 03/15/2023 Provider: Dr. Marchelle Gearing Pertinent labs: LFTs on 03/15/2023 wnl  Next OV: 07/13/23  Chesley Mires, PharmD, MPH, BCPS Clinical Pharmacist (Rheumatology and Pulmonology)

## 2023-07-13 ENCOUNTER — Ambulatory Visit: Payer: BLUE CROSS/BLUE SHIELD | Admitting: Internal Medicine

## 2023-07-23 ENCOUNTER — Encounter: Payer: Self-pay | Admitting: Internal Medicine

## 2023-07-23 ENCOUNTER — Other Ambulatory Visit (INDEPENDENT_AMBULATORY_CARE_PROVIDER_SITE_OTHER)

## 2023-07-23 ENCOUNTER — Ambulatory Visit (HOSPITAL_BASED_OUTPATIENT_CLINIC_OR_DEPARTMENT_OTHER): Admitting: Internal Medicine

## 2023-07-23 ENCOUNTER — Telehealth: Payer: Self-pay | Admitting: Internal Medicine

## 2023-07-23 ENCOUNTER — Ambulatory Visit (INDEPENDENT_AMBULATORY_CARE_PROVIDER_SITE_OTHER): Admitting: Internal Medicine

## 2023-07-23 VITALS — BP 118/66 | HR 80 | Ht 67.0 in | Wt 144.6 lb

## 2023-07-23 DIAGNOSIS — J84112 Idiopathic pulmonary fibrosis: Secondary | ICD-10-CM

## 2023-07-23 DIAGNOSIS — J439 Emphysema, unspecified: Secondary | ICD-10-CM

## 2023-07-23 DIAGNOSIS — Z5181 Encounter for therapeutic drug level monitoring: Secondary | ICD-10-CM | POA: Diagnosis not present

## 2023-07-23 DIAGNOSIS — I2723 Pulmonary hypertension due to lung diseases and hypoxia: Secondary | ICD-10-CM

## 2023-07-23 DIAGNOSIS — Z579 Occupational exposure to unspecified risk factor: Secondary | ICD-10-CM

## 2023-07-23 DIAGNOSIS — K521 Toxic gastroenteritis and colitis: Secondary | ICD-10-CM

## 2023-07-23 LAB — HEPATIC FUNCTION PANEL
ALT: 14 U/L (ref 0–53)
AST: 24 U/L (ref 0–37)
Albumin: 4.2 g/dL (ref 3.5–5.2)
Alkaline Phosphatase: 62 U/L (ref 39–117)
Bilirubin, Direct: 0.1 mg/dL (ref 0.0–0.3)
Total Bilirubin: 0.5 mg/dL (ref 0.2–1.2)
Total Protein: 6.8 g/dL (ref 6.0–8.3)

## 2023-07-23 LAB — PULMONARY FUNCTION TEST
DL/VA % pred: 63 %
DL/VA: 2.63 ml/min/mmHg/L
DLCO cor % pred: 50 %
DLCO cor: 12.02 ml/min/mmHg
DLCO unc % pred: 50 %
DLCO unc: 12.02 ml/min/mmHg
FEF 25-75 Pre: 3.69 L/s
FEF2575-%Pred-Pre: 160 %
FEV1-%Pred-Pre: 106 %
FEV1-Pre: 3.15 L
FEV1FVC-%Pred-Pre: 116 %
FEV6-%Pred-Pre: 96 %
FEV6-Pre: 3.65 L
FEV6FVC-%Pred-Pre: 106 %
FVC-%Pred-Pre: 91 %
FVC-Pre: 3.65 L
Pre FEV1/FVC ratio: 86 %
Pre FEV6/FVC Ratio: 100 %

## 2023-07-23 NOTE — Patient Instructions (Signed)
 Spirometry and DLCO Performed Today.

## 2023-07-23 NOTE — Telephone Encounter (Signed)
 Shaun [name]  What ius the incidence of erectild dysfunction with tyvaso ? Shaun [name] Was wondering  Thanks    SIGNATURE    Dr. Maire Scot, M.D., F.C.C.P,  Pulmonary and Critical Care Medicine Staff Physician, [facility] Center Director - Interstitial Lung Disease  Program  Pulmonary Fibrosis [facility] at [facility] [place], [place], [address]   Pager: [phone], If no answer  -> Check AMION or Try [phone] Telephone (clinical office): [phone] Telephone (research): [phone]  11:17 AM 07/23/2023

## 2023-07-23 NOTE — Patient Instructions (Addendum)
  IPF (idiopathic pulmonary fibrosis) (HCC) Familial idiopathic pulmonary fibrosis (HCC) Therapeutic drug monitoring   - IPF definitely progressive over time slowly but has stabilized [id number] ->  Dec 2024 after adding tyvaso  to regimen - PFT better April 2025 vserus Dec 2024 -Low-dose nintedanib is better tolerated   Plan  -Continue ofev  low-dose protocol at 100 mg twice daily  - see diarrhea management with CAROB flour below; if this works well we can look at going to full dose ofev  - liver function test 07/23/2023 - spirometry and dlco in  20 weeks - discussed [facility] transplant eval - defer for now  Occupational exposure in the workplace  -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  Pulmonary emphysema, unspecified emphysema type (HCC)  -You to have associated emphysema but this is the much minor component because fev1/fvc ratio always > 70 - curently stable  Plan - Continue Spiriva  Respimat samples 2.5 mcg dose and take it 2 puffs once daily  WHO-3 pulmonary hypertension - new diagnosis 06/16/22; started tyvaso  07/12/22  Doing well on tyvaso  DPI 48 stregth  Plan  - - continue tyvaso  dpi as before -   Diarrhea due to Ofev   - not an issue with low dose ofev   -did not need Carob flour  Plan  - monitor    - Followup - 20  weeks - ROV Dr Bertrum Brodie - 30 min vsiit after spiro/dlco

## 2023-07-23 NOTE — Progress Notes (Signed)
 OV 02/22/2016  Chief Complaint  Patient presents with   Pulmonary Consult    Pt referred by Dr.Byrum for ILD. Pt states he feels he is still recovering from the lung biopsy. Pt denies cough and CP/tightness.     2nd opinionand transfer of care for ILD/UIP - IPF from DR Byrum. Presents with his wife. They related to Dr. Audery Blazing cardiologist   68 year old male found to have crackles on physical exam and subsequently surgical lung biopsy 02/02/2016 showed UIP as read by pathologist in [place] .  [employer] of chest physicians interstitial lung disease questionnaire  Symptoms: He does not cough and is not troubled by shortness of breath except with strenuous exercise. Onset is only "recently" Past medical history: Positive for chronic sinus drainage mild. Recently after surgical lung biopsy was being a few bone unintentional weight loss. Otherwise negative Personal exposure history: He smoked occasional cigarettes having grown up on her tobacco farm starting at 35 including when he was age 62.  Family history: All the male members in his family who smoke developed COPD but also did not smoke to the pulmonary fibrosis especially his father and his paternal uncles. His dad was diagnosed with pulmonary fibrosis at age of 25 in Aug 29, 2001 and died from progressive hypoxemic respiratory failure after what sounds like an IPF flare in Aug 29, 2005.  Home exposure history: He is lived in the same house since Aug 30, 1987. The home does not have a humidifier or sound or hot tub or Jacuzzi. He is not exposed to any birds. 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 Approximately 30 years of work ending 4 years ago used to cut through dry walls and was exposed to concrete dust and drywall dust and possibly asbestos. HE is to  cut through insulation safe. Denies any farm work, Pension scheme manager was Patent examiner or being a Chiropractor. Did not work in a minor quarry a Theatre manager on [employer]. Denies any exposure to birds of feathers  Autoimmune history: Is negative.  History of exposure to pulmonary toxic drugs: Negative   Lab work - April 2017 and November 2017 extensive autoimmune and vascular this panel is negative  - High resolution CT scan of the chest August 2017: UIP pattern that is classic - personally visualized. There is also 5 mm right lower lobe lung nodule and a 5.9 cm left-sided lower bulla [stable compared to April 2017]   Severity - Pulmonary function test Aug 30, 2015: Isolated reduction in diffusion capacity 17.33/61%. Otherwise FVC 4 L/93% and normal. Total lung capacity 5.9 L/91% and normal - Walking desaturation test 185 feet 3 laps on room air in the office 02/22/2016: Did not desaturate but gets symptomatic   Emotional state: He is extremely worried about the diagnosis of IPF. This is particularly so because he has a strong family history and his father died within 4 years usually was diagnosed much order at age 87. Patient is worried that he got diagnosed with the same problem and much younger age of 24 and feels that he only will have a few years to live. Multiple times during the visit he was teary-eyed   OV 07/21/2016  Chief Complaint  Patient presents with   Follow-up  Pt denies change in breathing since last OV. Pt denies cough and SOB and CP/tightness.      Follow-up idiopathic pulmonary fibrosis familial variety  He presents with his wife. Overall he is stable. 4 routine activities of daily living and mowing the yard he does not have any shortness of breath or cough. He did find the mountains recently been quite dyspneic. This is worse than June 2017 with for the diagnosis of IPF. Overall he is tolerating the Ofev  just  fine except occasionally he has some GI discomfort.He is questions about research trials, exercise, pulmonary fibrosis foundation and family genetics.he is quite active and diffuse pulmonary rehabilitation is easy for him. He is willing to go to [facility] and work on aerobic strengthening and muscle conditioning  Notice that she's not had pulmonary function testing since May 2017    has a past medical history of Dyspnea; Hyperlipemia; and Pneumonia.   reports that he has never smoked. He has never used smokeless tobacco.  OV 10/25/2016  Chief Complaint  Patient presents with   Follow-up    Pt here after PFT. Pt states his breathing is unchanged. Pt c/o PND and dry cough. Pt denies f/c/s and CP/tightenss.    Follow-up mild idiopathic pulmonary fibrosis he's on Ofev  since  NOv 2017  Here with his wife. Overall he is stable. His pulmonary function tests and CT scan of the chest shows continued stability for 1 year. Symptom-wise also he is stable. His dyspnea when doing mountains. He is kayaking and biking and is able to outpace his wife. During this time he does not check his pulse oximetry. He does not have one. I've advised that he can get one. He is asking about familial issues, research and also prognosis we went over all this. Of note CT scan of the chest shows also he has concomitant emphysema. He prefers to avoid medications but he is interested in taking an inhaler if it means improving his dyspnea. He is active with pulmonary fibrosis foundation patient support groupo. Of note he reports easy bruising in his forearm when he bumps into something. His wife thinks it's been there all along and is unchanged. He is wondering if that this is related to Ofev  and asa. We did discuss about the bleeding risk of Ofev  and the need to hold this before any procedure. Overall he tolerates Ofev  just fine except for occasional diarrhea once a month. This is associated with fiber food    OV   02/26/2017  Chief Complaint  Patient presents with   Follow-up    PFT done today. Pt still taking OFEV  and doing good on it. Denies any complaints with cough, SOB, or CP.    Follow-up idiopathic pulmonary fibrosis on Nintendanib since November 2017  This time his wife is not here with him.  He has now been on nintedanib for a year.  Overall he is tolerating it fine except for mild occasional diarrhea.  He continues to be very active working out in the yard and doing daily walks with a few miles.  He notices only mild dyspnea with this relieved by rest.  He does not feel his health has declined in the last 1 year.  He does have concomitant emphysema on the CT scan but not apparent on the PFTs.  I asked him to try Spiriva  last time but it did not help.  He is dissatisfied with occasional albuterol  use.  He did have pulmonary function test today which is documented  below.  The FVC is stable but there is a change in the DLCO but he is not feeling it.  He is on IPF registry by [facility];  he is due for a study visit today.  He is interested in further studies especially [place].  We discussed this in detail brief.  He has had genetics testing he got the result.  He is worried insurance will deny this.  He is participating in the pulmonary fibrosis foundation patient support group.  He is asking details about the March 17, 2017 [place]   OV 05/22/2017  Chief Complaint  Patient presents with   Follow-up    PFT done today.  Pt stated he had the flu 05/05/17 and was not able to eat on 05/06/17 so he missed two OFEV  doses. States he has had diarrhea a couple times since then but is now doing fine with help from immodium.  States his breathing has been doing good.      Follow-up idiopathic pulmonary fibrosis on Nintendanib since November 2017   68 year old male with idiopathic pulmonary fibrosis.  He is on stable dose of nintedanib.  He tells me earlier this month he had stomach flu and that he suddenly  had subjective sense of fever associated with diarrhea and vomiting.  This resolved in 2 days.  Since then he is lost a couple of pounds of weight.  He is worried about tolerating the nintedanib.  In addition primary function test shows a 3% decline in FVC and 2 years and a 12% decline in DLCO in 2 years.  He is now wondering if the drug is effective.  Overall other than the recent illness and occasional mild diarrhea he is tolerated off of just fine.  But he is now worried about the tolerance.  He has been advised by [facility] nursing support team about taking protein with nintedanib to offset side effects and he has questions about this.   OV 08/14/2017  Chief Complaint  Patient presents with   Follow-up    PFT done today. Pt states he has been doing good since last visit and denies any complaints.     Follow-up idiopathic pulmonary fibrosis surgical lung biopsy diagnosis February 02, 2016 on Nintendanib since November 2017  Mr. Desa continues to do well.  At the time of last visit there was concern about weight loss and also progression in lung function.  However at that time he had a respiratory infection.  Today he comes back and tells me that he has gained 1 pound of weight.  His respiratory symptoms have resolved he is back to being at a stable mild exertional dyspnea.  He is extremely active.  Concordant with his symptom improvement his FVC has improved and his DLCO is stable.  Overall his pulmonary function test is stable since November 2018 at least with a DLCO although the DLCO has progressed since 2017.  His FVC has been stable all along since May 2017.  He is not interested in the [place] research study because of the oral pill and can make his diarrhea potentially worse if he gets randomized to the drug.  He is interested in an upcoming inhaler trial of IPF.  He is active in the support group and is asking about the next meeting.  He is not on oxygen.        Ct Chest High  Resolution  Result Date: 10/25/2016 CLINICAL DATA:  Interstitial lung disease, former smoker, cough. Lung biopsy November 2017. EXAM:  CT CHEST WITHOUT CONTRAST TECHNIQUE: Multidetector CT imaging of the chest was performed following the standard protocol without intravenous contrast. High resolution imaging of the lungs, as well as inspiratory and expiratory imaging, was performed. COMPARISON:  11/23/2015 and 07/08/2015. FINDINGS: Cardiovascular: Vascular structures are unremarkable. Heart size normal. No pericardial effusion. Mediastinum/Nodes: Mediastinal lymph nodes are not enlarged by CT size criteria. Hilar regions are difficult to evaluate without IV contrast. No axillary adenopathy. Esophagus is grossly unremarkable. Lungs/Pleura: Centrilobular and paraseptal emphysema with a large bullous lesion in the left lower lobe. Post biopsy changes in the right upper, right middle and right lower lobes. Superimposed basilar predominant subpleural reticulation, ground-glass and traction bronchiectasis/bronchiolectasis, likely stable from baseline examination of 07/08/2015 when differences in slice collimation are considered. There may be scattered honeycombing. Mild subpleural nodularity along the right major fissure is unchanged from 07/08/2015, indicative of subpleural lymph nodes. No pleural fluid. Airway is unremarkable. Upper Abdomen: Visualized portions of the liver and gallbladder are unremarkable. Mild nodular thickening of both adrenal glands. Visualized portion of the right kidney is unremarkable. 2.5 cm low-attenuation lesion in the left kidney is likely a cyst. Visualized portions of the spleen, pancreas, stomach and bowel are grossly unremarkable. No upper abdominal adenopathy. Musculoskeletal: No worrisome lytic or sclerotic lesions. Degenerative changes in the spine. IMPRESSION: 1. Pulmonary parenchymal pattern of fibrosis is likely stable from 07/08/2015 when differences in slice collimation are  considered. Findings are in keeping with the pathologic diagnosis of usual interstitial pneumonitis. 2.  Emphysema (ICD10-J43.9). Electronically Signed   By: Shearon Denis M.D.   On: 10/25/2016 08:23   OV 02/26/2018  Subjective:  Patient ID: Daylene Evangelist, male , DOB: Jul 16, 1955 , age 83 y.o. , MRN: [id number] , ADDRESS: [address] [place] [address]   02/26/2018 -   Chief Complaint  Patient presents with   Follow-up    breathing well     HPI KAYNEN MINNER 68 y.o. -follow-up familial IPF.  Last seen in May 2019.  After that in July 2019 he is screened for the electric face to inhaler study but he screen failed because he is standard of care CT scan of the chest from July 2018 was not per sponsored protocol or 2018 ATS protocol.  At this point in time he continues on nintedanib.  He is not taking proton pump inhibitor and wants to know if that is okay.  He feels he is stable.  There are no new issues.  The main thing is with nintedanib once a month he will have sporadic diarrhea.  He says he has been unable to control this but from a respiratory standpoint he feels he is stable.  There are no new medical issues.  He is up-to-date with his vaccines although immunization record review shows that he could benefit from Prevnar 13 which she has not had.  He continues to be actively engaged in the patient support group.   ROS - per HPI    OV 04/29/2018  Subjective:  Patient ID: Daylene Evangelist, male , DOB: 1956-01-09 , age 31 y.o. , MRN: [id number] , ADDRESS: [address] [place] [address]   04/29/2018 -   Chief Complaint  Patient presents with   Follow-up    PFT performed today.  Pt states he is about the same since last visit.      HPI METRO EDENFIELD 68 y.o. -returns for IPF follow-up.  He is on nintedanib.  He reports continued stability.  He  still has ongoing occasional issue with diarrhea that happened suddenly every few weeks.  It is so random.  It bothers  him and does affect his quality of life.  He takes Imodium in response.  We did discuss about taking Imodium proactively once a week.  He is now to give this a try.  He continues to be interested in pulmonary reserve study particularly face to inhaler study sponsored by biotech [employer].  Our research department is getting ready and probably should be able to screen him in the next few to several weeks.  His last pulmonary function test was today and shows continued stability.  Last liver function test was November 2019 and was normal.  His walk test today is baseline     ROS   OV 11/29/2018  Subjective:  Patient ID: Daylene Evangelist, male , DOB: 14-Jul-1955 , age 61 y.o. , MRN: [id number] , ADDRESS: [address] [place] [address]   11/29/2018 -   Chief Complaint  Patient presents with   IPF (idiopathic pulmonary fibrosis)    No issues, states medication is working for him.   Follow-up idiopathic pulmonary fibrosis on Nintendanib since November 2017  HPI JAZMINE LONGSHORE 68 y.o. -presents for follow-up of IPF.  Last visit was early in 2020.  And then the pandemic care.  He try to get down an inhaler trial of phase 2 for IPF - [employer] but he screen failed because of technical issues with the spirometry.  He says overall he is doing well.  He is just following low risk activities.  He has a christening coming up for his grandson.  This will be outdoors in [place].  A total of 12 people will attend.  Everybody will be Masked.  He continues taking nintedanib and does not have any issues tolerating it.  Last liver function test was over 6 months ago.  He needs another one now.  He is also asking for a flu shot.  His symptom score and walking desaturation test showed continued stability.  OV 07/15/2019  Subjective:  Patient ID: Daylene Evangelist, male , DOB: [birth year] , age 71 y.o. , MRN: [id number] , ADDRESS: [address] [place] [address]   07/15/2019 -   Chief Complaint  Patient  presents with   Follow-up    Follow-up idiopathic pulmonary fibrosis on Nintendanib since November 2017   HPI MANNIE OHLIN 68 y.o. -presents for IPF follow-up.  Last seen August 2020.  This visit was supposed to have a pulmonary function test but this was not scheduled.  Patient overall feels stable.  Dyspnea is stable and minimal.  He walks on a regular basis 45 minutes.  He tolerates nintedanib but has mild symptoms.  He is asking about going to a lower dose but tells me that if the drug has been beneficial he says he can manage with the current symptoms.  He says he has had the COVID-19 vaccine at this point.  Last second dose was 1 week ago.  He is in the IPF pro registry.  He is not on any interventional trial.  He screen fell for an inhaler trial but that has been withdrawn because of safety reasons.  He was never a participant in the trial.  He is interested in other trials.  He reported that he is going to be 68 years old and will become a Medicare recipient in the next several months.  He is going to look for insurance options.  Walking desaturation test was stable.  He is okay waiting till the next visit for spirometry.     OV 01/14/2020   Subjective:  Patient ID: Daylene Evangelist, male , DOB: 04-25-1955, age 2 y.o. years. , MRN: [id number],  ADDRESS: [address] [place] [address] PCP  Glena Landau, MD Providers : Treatment Team:  Attending Provider: Maire Scot, MD Patient Care Team: Glena Landau, MD as PCP - General (Family Medicine)    Chief Complaint  Patient presents with   Follow-up    PFT performed 10/12.  Pt states he has been doing okay since last visit. States that he is still having problems with diarrhea which he believes has become worse.    IPF diagnosed in November 2017.   HPI JOVAUN LEVENE 68 y.o. -returns for follow-up of his pulmonary fibrosis IPF.  He is coming up for years since diagnosis.  Overall he reports that the  stability in his symptoms see below.  He tells me that when he goes for a 35/45-minute walk he has difficulty with inclines.  He did say this is overall stable but may be compared to a year or 2 ago it is slightly worse.  Review of his pulmonary function test shows compared to 2017 there are some decline in both FVC and DLCO and compared to early 2020 there is slight decline in DLCO although compared to most recent numbers his DLCO is better.  His weight is stable.  Overall he is tolerating the nintedanib fine but he is having diarrhea.  The diarrhea is uncomfortable but nevertheless is only 2 episodes a week.  Last visit I advised him to take Imodium once a week on a scheduled basis but he says he prefers not to take medicine and he takes it as needed.  If this is then restarting to him using Imodium twice a week.  We discussed and is willing to try once a week schedule.  The other alternative he wanted to discuss was reducing the nintedanib to 100 mg twice daily.  I cautioned against this given the fact his lungs have had only a slight progression nevertheless there is progression and with the lower dose of nintedanib may be it is less effective.  Therefore is going to try the Imodium route.  He has upcoming colonoscopy because of his diarrhea.  We advised the to stop the nintedanib 2 days before and 1-2 days after the procedure.  Similarly for dental procedure he will stop the nintedanib 2 days before and resume 1 or 2 days after the procedure.   He is interested in research protocol.  We discussed several studies.  He is not interested in the Phase 2 [place] study.  We discussed promedior IV infusion study.  He wants to think about this.  We will send in the consent form.  This upcoming study with an inhaler that is already preapproved in the market for pulmonary hypertension.  Is called treprostinil .  If we get the study then he might be interested in this.   OV 12/22/2020  Subjective:  Patient ID:  Daylene Evangelist, male , DOB: 02/25/1956 , age 16 y.o. , MRN: [id number] , ADDRESS: [address] [place] [facility] [address] PCP Glena Landau, MD Patient Care Team: Glena Landau, MD as PCP - General (Family Medicine)  This Provider for this visit: Treatment Team:  Attending Provider: Maire Scot, MD    12/22/2020 -   Chief Complaint  Patient presents with   Follow-up  PFT performed today. Pt states he is about the same since last visit. Currently has some problems with sinus drainage.    IPF diagnosed in November 2017.  Family of pulmonary fibrosis - last CT March 2020 - IPF-Pro registre HPI JENNY OMDAHL 68 y.o. -returns for follow-up.  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  Overall he is doing stable.  He is adjusted his diet and his diarrhea with nintedanib is under much better control.  His weight is stable.  His symptom scores are stable.  Side effect from diarrhea improved.  He had a 6-minute walk test and this was normal.  He had liver function test today and it is normal.  He had questions about his prognosis.  I explained that given stability of ILD that more than likely is going to be stable in the next 1 year and beyond that it is difficult to predict.  This is based on Calculator.  We looked at his trend line with the FVC and DLCO.  His DLCO this is improved compared to last year.  Therefore overall it appears that he had a decline between 2017 and 2018 but since late 2018 he has been stable till the current time September 2022.  He is always been interested in clinical trials as a care option.  He was given the option of inhaler treprostinil  study called [facility].Aaron Aas  He did not want to do oral drug study.  He has reviewed the consent form he is not interested in this because he has to take the inhaler 4 times each day and more than 9 times each time.  We discussed IV recombinant Pentraxin with goodphase 2 to safety  and efficacy data .  I shorten the publication images of the data on this.  I have given him that.  Also give him a detailed consent form forhis for review.  He will read and reflect on this.          07/25/2021 Follow up : IPF  Patient presents for a 69-month follow-up.  Patient has underlying IPF.  Patient says overall breathing is doing about the same.  He gets short of breath with heavy activities but tries to remain active every day.  Typically walks about 30 to 40 minutes every day.  Recently was able to go hiking but did notice that when he was going up an incline/he will he did get some shortness of breath and had to rest.  Patient says his weight has been stable.  Patient remains on Ofev .  Patient says his chronic diarrhea has been managed with Imodium.  Typically is only have to take it about every 3 days or so.  And only takes a half a tablet.  He feels that this has become under better control. Patient says he does check his oxygen levels at home.  Has not noticed any lower oxygen levels. PFTs done on July 12, 2021 are essentially stable.  FEV1 at 108%, ratio 80, FVC 100%, DLCO 62%.  This is very similar to his lung function testing in March 2022. Patient is talking to [facility] research team  and has been referred for  the ILD pro registry.     OV 01/03/2022  Subjective:  Patient ID: Daylene Evangelist, male , DOB: 1955-08-11 , age 25 y.o. , MRN: [id number] , ADDRESS: [address] [place] [address] PCP Glena Landau, MD Patient Care Team: Glena Landau, MD as PCP -  General (Family Medicine)  This Provider for this visit: Treatment Team:  Attending Provider: Maire Scot, MD    01/03/2022 -   Chief Complaint  Patient presents with   Follow-up    Review PFT's. Breathing is slightly worse. He gets winded walking up an incline. He is walking approx 2 miles per day. He would like a flu vaccine today.     HPI AZREAL STTHOMAS 68 y.o. -returns for follow-up.   I personally not seen him in a year.  From a respiratory symptom standpoint he is stable as can be seen below.  His weight is also stable.  He marked mild for diarrhea but this is with Imodium.  He says he is worried about taking Imodium every other day.  His diet is actually more than what is listed.  He says this week and he had to go to [place] to see his grandchildren and he had to stop.  He says the diarrhea is unpredictable.  Therefore he is asking if he can go to the low-dose of nintedanib.  We discussed 4  options including taking a holiday and then going to the low-dose versus taking a holiday from nintedanib and going to the full dose versus itching to pirfenidone versus enrolling in a clinical trial involving due to deutrified pirfenidone.  We weighed all the options.  I personally recommended taking a holiday and then rechallenging himself with nintedanib.  We took a shared decision making to go with a low-dose.  He is aware the low-dose might not be as efficacious but given his relative stability over time he is willing to do that.  He is simply does not want to do the high-dose because of the diarrhea.  Of note he is in the IPF-Pro registry and he will participate in that today.  He will have a high-dose flu shot today as well.  Perfect that all       OV 03/21/2022  Subjective:  Patient ID: Daylene Evangelist, male , DOB: 14-Aug-1955 , age 42 y.o. , MRN: [id number] , ADDRESS: [address] [place] [address] PCP Glena Landau, MD Patient Care Team: Glena Landau, MD as PCP - General (Family Medicine)  This Provider for this visit: Treatment Team:  Attending Provider: Maire Scot, MD    03/21/2022 -   Chief Complaint  Patient presents with   Follow-up    Pt states he has been doing okay since last visit and denies any real complaints.     HPI JERUSALEM WERT 68 y.o. -presents for follow-up.  Since his last visit around Thanksgiving 2023 picked up sinus  infection and is having a lot of clearing of the throat and sinus drainage.  He tells me that he is got constant sinus drainage that is perennial.  At the last visit because of significant diarrhea he stopped his nintedanib.  I told him to take an interim holiday and restarted on the low-dose protocol.  He is now 100 mg twice daily for the last 3-4 weeks.  His diarrhea has returned but it is nowhere as severe as the past.  He has had 2 or 3 bouts only.  He is also wondering if it is because of the recent respiratory infection that he had.  He states he is on the mend.  He did have colored sputum but right now is all clear but he does have cough more than baseline and sinus drainage more than baseline.  There is no wheezing.  He  is open to trying short course of prednisone .  He did have a CT scan of the chest that shows significant progression.  He is now noticing more difficulty doing hills when he goes for his daily walks.  Symptom score shows worsening.  He has significant emphysema and this might exclude him from some studies even though his FEV1 FVC ratio is normal or greater than 70  We also discussed [employer] DEXA bone scan study for the study of osteoporosis and osteopenia in patients with IPF.  He is willing to go through this.  I have emailed him a consent form.0 0  0 CT Chest data - HRCT 03/13/22  Narrative & Impression  CLINICAL DATA:  Follow-up IPF, interstitial lung disease, history of biopsy   EXAM: CT CHEST WITHOUT CONTRAST   TECHNIQUE: Multidetector CT imaging of the chest was performed following the standard protocol without intravenous contrast. High resolution imaging of the lungs, as well as inspiratory and expiratory imaging, was performed.   RADIATION DOSE REDUCTION: This exam was performed according to the departmental dose-optimization program which includes automated exposure control, adjustment of the mA and/or kV according to patient size and/or use of  iterative reconstruction technique.   COMPARISON:  06/10/2018, 10/24/2016, 11/23/2015, 07/08/2015   FINDINGS: Cardiovascular: Scattered aortic atherosclerosis. Normal heart size. No pericardial effusion.   Mediastinum/Nodes: Unchanged prominent mediastinal lymph nodes. Thyroid gland, trachea, and esophagus demonstrate no significant findings.   Lungs/Pleura: Severe pulmonary fibrosis in a pattern with apical to basal gradient featuring irregular peripheral interstitial opacity, septal thickening, traction bronchiectasis, and large areas of honeycombing at the lung bases. Fibrotic findings are slightly worsened in comparison to most recent examination dated 06/10/2018 and clearly worsened over a longer period of time dating back to 2017. Moderate underlying centrilobular and paraseptal emphysema. No pleural effusion or pneumothorax.   Upper Abdomen: No acute abnormality.   Musculoskeletal: No chest wall abnormality. No acute osseous findings.   IMPRESSION: 1. Severe pulmonary fibrosis in a pattern with apical to basal gradient featuring irregular peripheral interstitial opacity, septal thickening, traction bronchiectasis, and large areas of honeycombing at the lung bases. Fibrotic findings are slightly worsened in comparison to most recent examination dated 06/10/2018 and clearly worsened over a longer period of time dating back to 2017. Findings remain consistent with UIP per consensus guidelines: Diagnosis of Idiopathic Pulmonary Fibrosis: An Official ATS/ERS/JRS/ALAT Clinical Practice Guideline. Am Annie Barton Crit Care Med Vol 198, Iss 5, [phone], Dec 02 2016. 2. Moderate underlying emphysema. 3. Unchanged prominent mediastinal lymph nodes, likely reactive.   Aortic Atherosclerosis (ICD10-I70.0) and Emphysema (ICD10-J43.9).     Electronically Signed   By: Fredricka Jenny M.D.   On: 03/13/2022 16:19    No results found.   OV 05/15/2022  Subjective:  Patient ID:  Daylene Evangelist, male , DOB: [birth year] , age 28 y.o. , MRN: [id number] , ADDRESS: [address] [place] [address] PCP Glena Landau, MD Patient Care Team: Glena Landau, MD as PCP - General (Family Medicine)  This Provider for this visit: Treatment Team:  Attending Provider: Maire Scot, MD    05/15/2022 -   Chief Complaint  Patient presents with   Follow-up    More SOB with walking up hills.  Not using albuterol  MDI.  No difference when used Spiriva     HPI JESSE NOSBISCH 68 y.o. -returns for follow-up.  Initially said he is stable but his pulmonary function test shows persistent decline since April 2023 and slight decline since September 2023.  This is consistent with progression of fibrosis on the December 2023 CT scan.  Walking desaturation test showed pulse ox dropping to 89%.  He then admitted that while doing daily walks walking up hills and all that is more difficult.  He feels that he is getting worse since the fall 2023 when he switched to low-dose nintedanib.  In fact his symptom scores are gone from a dyspnea score of 1-3 and this started around the time he switched to the low-dose nintedanib.  He is wondering if this could be the reason I did acknowledge that low-dose is less effective.  After years of being stable he started having side effects with the full dose nintedanib and therefore the switch was made.  We are quite concerned about disease progression.  The other possibility is onset of pulmonary hypertension.  Currently inhaled treprostinil  is approved for WHO group 3 pulmonary hypertension related to ILD.  This is be a standard of care treatment.  Therefore to evaluate this he would need echocardiogram and/or  right heart catheterization..  Previously his BNP is negative.  We discussed getting workup done for pulmonary hypertension.  He is agreeable to this.  There are 2 options he can go through insurance protocol or try to qualify for research  protocol sponsored by [employer] called PHINDER study.  We gave him a copy of the consent.  He later emailed me saying that he is willing to start go going to the research protocol route.  If she screen fails then we will talk to cardiology and try to arrange standard of care right heart catheterization.  If he does not have pulmonary hypertension then we would discuss clinical trials as a care option in the setting of progressive disease.        OV 3?125/24   Subjective:  Patient ID: Daylene Evangelist, male , DOB: [birth year] , age 5 y.o. , MRN: [id number] , ADDRESS: [address] [place] [address] PCP Glena Landau, MD Patient Care Team: Glena Landau, MD as PCP - General (Family Medicine) Hunsucker, Archer Kobs, MD as Consulting Physician (Pulmonary Disease)  This Provider for this visit: Treatment Team:  Attending Provider: Maire Scot, MD   07/04/2022 -   Chief Complaint  Patient presents with   Follow-up    Pt states he is feeling the same. RHC 3/15     HPI RHODES CALVERT 68 y.o. -returns for follow-up to discuss his right heart catheterization.  In the interim he tried high-dose full dose nintedanib but he could not tolerate it.  Diarrhea came back.  So he is back in low-dose.  He almost quit taking nintedanib but decided to do low-dose and is tolerating this fine.  He also had right heart catheterization 06/16/2022 and it shows mild pulmonary hypertension.  He does meet increase study criteria for inhaled treprostinil .  We went over in detail both treprostinil  nebulizer and DPI.  Decided to go with nebulizer as first choice so it is easily titratable.  Discussed side effect profile and anecdotal experience of 20% intolerance.  Discussed the benefits of improved walk distance and dyspnea and potential signal in modulation of IPF.  He is willing to try this.  He will need to do paperwork for this.  Today is a discussion only visit.  Of note he did have  vitamin D  deficiency and I prescribed vitamin D  supplements for him.  Discussed a lot of nausea therefore he stopped it.  He is willing to get his  vitamin D  level rechecked avoid toxicity.     RHC 06/16/22 Hemodynamics (mmHg) RA mean 2 RV 38/4 PA 47/13, mean 26 PCWP mean 7  Oxygen saturations: PA 78% AO 94%  Cardiac Output (Fick) 6.11  Cardiac Index (Fick) 3.57 PVR 3.1 WU  0  OV 08/08/2022  Subjective:  Patient ID: Daylene Evangelist, male , DOB: 1955/04/19 , age 52 y.o. , MRN: [id number] , ADDRESS: [address] [place] [address] PCP Glena Landau, MD Patient Care Team: Glena Landau, MD as PCP - General (Family Medicine) Hunsucker, Archer Kobs, MD as Consulting Physician (Pulmonary Disease)  This Provider for this visit: Treatment Team:  Attending Provider: Maire Scot, MD    08/08/2022 -   Chief Complaint  Patient presents with   Follow-up    F/up on IPF   HPI Daylene Evangelist 68 y.o. -returns for follow-up.  He started inhaled treprostinil  nebulizer 07/12/2022.  Currently taking 4 times a day each time 6 times.  No side effect so far.  Plans to escalate.  He finds the nebulizer inconvenient and wants to switch to the DPI.  I advised him to wait another 4 to 6 weeks.  And to call us  and if he is tolerating well at a minimum 9 times nebulizer each time 4 times daily then we can consider switch to DPI.  He plans to go to [place]  for his annual summer trip but has postponed this to July 2024 and wants to make this decision at least in the next 4 to 6 weeks.  Supported him on this.  He is tolerating low-dose nintedanib 100 mg twice daily.  Last liver function test February he needs another liver function test today.  He continues to Spiriva .  He feels his dyspnea is improved although the dyspnea score itself is the same.  He feels he is getting some sinusitis because of the nebulizer he still has some green discharge.  He agreed to take doxycycline .  He also  reported anxiety because of his illness.  Wanted to know his life expectancy wanted to know if he could live another 10 years.  I did indicate to him that it was reasonable to expect living another 10 years with this disease.  Depends on some amount of LOC but also the benefit of therapeutics and new advances.  Did discuss transplant was an option.  Rated his current disease as moderate but with progression heading to severe.  But the progression is slow.  Appears that treprostinil  is helping him because he is feeling better and he did not desaturate with exertion today [which she did last time].  He deferred referral to [facility] evaluation but he said he will talk to his neighbor who has had a lung transplant.  He is reporting reactive anxiety and depression.  We discussed this and he agreed to be referred to psychology.   OV 11/14/2022  Subjective:  Patient ID: Daylene Evangelist, male , DOB: 04/20/1955 , age 60 y.o. , MRN: [id number] , ADDRESS: [address] [place] [address] PCP Glena Landau, MD Patient Care Team: Glena Landau, MD as PCP - General (Family Medicine) Hunsucker, Archer Kobs, MD as Consulting Physician (Pulmonary Disease)  This Provider for this visit: Treatment Team:  Attending Provider: Maire Scot, MD    11/14/2022 -   Chief Complaint  Patient presents with   Follow-up    F/up on PFT     HPI Daylene Evangelist 68 y.o. -he is now on  Tyvaso  DPI.  As of September 27, 2022 pharmacist noted, "Patient is titrating to Tyvaso  DPI 48mcg four times daily on 09/27/22. Patient is scheduled for in person visit today for DPI training .  Since then he has been on DPI.  He is able to tolerate only 48 mcg 4 times daily.  He says when he went to the nebulizer at 10 times it made him feel uneasy.  He has no side effects of the current DPI dose.  Is also taking nintedanib low-dose protocol.  He says the combination is really helped him he is feeling less short of  breath.  Symptom scores below show improvement in shortness of breath.  No side effects from the treatment.  His pulmonary function test was also stabilized since he started inhaled treprostinil .  He has about respiratory vaccines and I recommended flu shot and updated COVID-vaccine.      OV 03/15/2023  Subjective:  Patient ID: Daylene Evangelist, male , DOB: [birth year] , age 73 y.o. , MRN: [id number] , ADDRESS: [address] [place] [address] PCP Glena Landau, MD Patient Care Team: Glena Landau, MD as PCP - General (Family Medicine) Hunsucker, Archer Kobs, MD as Consulting Physician (Pulmonary Disease)  This Provider for this visit: Treatment Team:  Attending Provider: Maire Scot, MD    03/15/2023 -   Chief Complaint  Patient presents with   Follow-up    PFT done. Breathing is stable.      HPI BENICIO MANNA 68 y.o. -returns for follow-up.  Since his last visit Interim Health status: No new complaints No new medical problems. No new surgeries. No ER visits. No Urgent care visits. No changes to medications other than the fact he is not using Tyvaso  DPI.  He is only able to tolerate 48 mcg dose.  He is unable to recall what side effect he had with the nebulizer past 9 puffs each time.  He is liking the DPI but he is worried that it is a nonformulary for insurance and he will have to pay heavily from 2024 onwards.  He already in October 2024 had a conversation with the pharmacist.  I reviewed the record.  However he wante me to reach out to the pharmacy team which have done.  He continues to be on nintedanib at low-dose.  His diarrhea is much better on the lower dose but he still has occasional significant amount of diarrhea.  I discussed literature showing CAROB flour to be beneficial in the interim related diarrhea.  He is willing to try this.  He is up-to-date with his vaccines.  He had pulmonary function test today shows continued stability ever since he went on  time also.  I did review these results with him and showed him the results.  He did do a sit/stand exercise hypoxemia test and his pulse ox held up.      OV 07/23/2023  Subjective:  Patient ID: Daylene Evangelist, male , DOB: 08/03/1955 , age 58 y.o. , MRN: [id number] , ADDRESS: [address] [place] [address] PCP Glena Landau, MD Patient Care Team: Glena Landau, MD as PCP - General (Family Medicine) Hunsucker, Archer Kobs, MD as Consulting Physician (Pulmonary Disease)  This Provider for this visit: Treatment Team:  Attending Provider: Maire Scot, MD   Emphysma = alpha-1 MM   IPF diagnosed in November 2017.   - Family of pulmonary fibrosis - last CT March 2020  - emphysema > Fibrosis -Repeat CT scan of the chest  December 2023: With progression and significant emphysema present. - IPF-Pro registre -Low-dose nintedanib since Oct 2023 due to diarrhea  Combined pulmonary fibrosis and emphysema - with fibrosis much biggter component and epysema only mild due to fev/76fc fation > 70 at all time  WHO group 3 pulmonary hypertension diagnosed 06/16/2022: Mean PAP 26, PCWP 7, PVR 3.1 -> tuyvaso nebs 07/12/22 -> changed to DPI at 40 mcg dose in July 2024    Saw counselor r September 12, 2022 for adjustment disorder with anxious mood.  07/23/2023 -   Chief Complaint  Patient presents with   Follow-up    PFT done today. Breathing is stable.      HPI LESTER CRICKENBERGER 68 y.o. -returns for followup. Remains on ofev  100mg  bid - did not try carob flour. Found it dificult to mix but also low dose ofev  dose is producing diarrhea. Also doing Tyvaso  DPI 48mcg. No side effets. FVC appears stable today. Symptms socre and exercise test is stable.Need sLFT check today. He had few othre questions. Below. Otherwise, Interim Health status: No new complaints No new medical problems. No new surgeries. No ER visits. No Urgent care visits. No changes to medications   A) if tyvaso  can cause  erectile dysfunction - have reached out to pharmacy team  B) he is also pursuing workmans compe - he worked for [employer]. Did carpentry and consturction wrk, .  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 Explained to him this is definite risk factor for IPF and is consistent with fibrotic lung diseases developing years/decades aftrer exposure. Supported him with workman comp         SYMPTOM SCALE - ILD 8/28/202 0 140# 07/15/2019  01/14/2020 138# 12/22/2020 138# 01/03/2022 142# 03/21/2022 144# low dose ofev  since oct- nov 2023 05/15/2022 Low dose ofev  06/26/22 08/08/2022 Low dose ofev  + tyvaso  since 07/12/22 11/14/2022 Low dose ofev  + tyvasos dpi 07/23/2023 Low doseofe + tyvaso  DPI  O2 use Room air   ra ra ra ra ra ra ra Room air  Shortness of Breath 0 -> 5 scale with 5 being worst (score 6 If unable to do)            At rest 0 0 0 0 0 0 0 0 0 0  0  Simple tasks - showers, clothes change, eating, shaving 0 0 0 0 0 0 0 0 00 0  0  Household (dishes, doing bed, laundry) 0 0  0 0  0 0 0 0 0  Shopping 0 0 0 0 0 00 0 0 0 0  0  Walkin sel pac 0 0 00 0 0 1 1 0 1 0  0  Walking up Stairs 1 1 1 1 1 2 2 2 2 1 2   Total (40 - 48) Dyspnea Score 1 1 1 1 1 3 3 3 3 1 2   How bad is your cough? 0 0 1 0 0 0 1 1 2 0  0  How bad is your fatigue 0 1 1 1 1 1 1 0 1 1  1  nausea  1 1 0 0 0 0 0 0 0 0   vomit  0 0 0 0 0 0 0 0 00 0   diarhea  1 2 1 2 1 1 1 1  0 00  anxity  1 1 1 1 1 1 1 2  00 0  depresion  1 1 0 1 1 1  x 2 0 0     Simple office walk - 250  feet x 3 laps 02/26/2018  04/29/2018  11/29/2018  07/15/2019  03/21/2022   05/15/2022  08/08/2022 Low dose ofev  and tyvaso  03/15/2023 Low-dose Ofev  and Tyvaso  DPI at 48 mcg 07/23/2023   O2 used Room air Room air Room air ra ra ra ra    Number laps completed 3 3 3 3 3 3 3  Sit stand x 15   Comments about pace Good brisk          Resting Pulse Ox/HR 100% and 74/min 100% and 86/min     100% and 76/min 100%and 79/min 99% ad HR 75 97% and Hr  98% and HR79 96% and 83/min   Final Pulse Ox/HR 96% and 96/min 97% and 93/min 96% and 99/min 96% and 111/min 93% and HR 100 89% and HR 92% and HR 98 And 93% and 106   Desaturated </= 88% no no no no No       Desaturated <= 3% points yes Yes, 3 Yes, 4 Yes, 4 points Yes 6 point  Yes 4 points    Got Tachycardic >/= 90/min yes yes yes yes yes      Symptoms at end of test No dyspnea No dyspnea  asympt Lot of throast clearning  No dyspnea. better No dyspnea and test results are improved since starting Tyvaso .   Miscellaneous comments x x               SIT STAND TEST - goal 15 times   07/23/2023    O2 used ra   PRobe - finter or forehead ?   Number sit and stand completed - goal 15 15   Time taken to complete 48 sec   Resting Pulse Ox/HR/Dyspnea  96% and 81/min and dyspnea of 1/10    Peak measures 93 % and 102/min and dyspnea of 2/10   Final Pulse Ox/HR 94% and 77/min and dyspnea of x/10   Desaturated </= 88% no   Desaturated <= 3% points yes   Got Tachycardic >/= 90/min yes   Miscellaneous comments x      PFT     Latest Ref Rng & Units 07/23/2023    7:53 AM 03/15/2023    2:41 PM 11/14/2022   11:13 AM 06/13/2022   10:25 AM 05/15/2022    9:10 AM 12/27/2021    9:48 AM 07/12/2021   11:46 AM  PFT Results  FVC-Pre L 3.65  3.57  3.74  3.77  3.69  3.83  3.94   FVC-Predicted Pre % 91  88  92  95  93  97  100   Pre FEV1/FVC % % 86  85  84  85  78  87  80   FEV1-Pre L 3.15  3.03  3.14  3.20  2.86  3.35  3.16   FEV1-Predicted Pre % 106  101  105  109  97  115  108   DLCO uncorrected ml/min/mmHg 12.02  10.56  11.81  11.57  12.96  12.42  14.62   DLCO UNC% % 50  43  49  48  54  52  62   DLCO corrected ml/min/mmHg 12.02  10.56  11.81  11.57  12.96  12.42  14.62   DLCO COR %Predicted % 50  43  49  48  54  52  62   DLVA Predicted % 63  52  61  60  57  61  73   TLC L    5.44  TLC % Predicted %    86      RV % Predicted %    94           LAB RESULTS last 96 hours No results  found.       has a past medical history of Dyspnea, Family history of colon cancer, Family history of prostate cancer, Family history of pulmonary fibrosis, Hyperlipemia, and Pneumonia.   reports that he has never smoked. He has never used smokeless tobacco.  Past Surgical History:  Procedure Laterality Date   COLONOSCOPY WITH PROPOFOL  N/A 09/21/2014   Procedure: COLONOSCOPY WITH PROPOFOL ;  Surgeon: Garrett Kallman, MD;  Location: [facility];  Service: Endoscopy;  Laterality: N/A;   FOREARM SURGERY     excision of birth mark"child"   HERNIA REPAIR     RIH-open   KNEE ARTHROTOMY Left    bursa excision   LUNG BIOPSY Right 02/02/2016   Procedure: LUNG BIOPSY;  Surgeon: Zelphia Higashi, MD;  Location: [facility] OR;  Service: Thoracic;  Laterality: Right;   NASAL SEPTUM SURGERY     RIGHT HEART CATH N/A 06/16/2022   Procedure: RIGHT HEART CATH;  Surgeon: Darlis Eisenmenger, MD;  [facility] INVAS[facility] CV LAB;  Service: Cardiovascular;  Laterality: N/A;   VIDEO ASSISTED THORACOSCOPY Right 02/02/2016   Procedure: VIDEO ASSISTED THORACOSCOPY;  Surgeon: Zelphia Higashi, MD;  Location: [facility] OR;  Service: Thoracic;  Laterality: Right;   WRIST GANGLION EXCISION Left     Allergies  Allergen Reactions   Penicillins Other (See Comments)    UNSPECIFIED REACTION OF CHILDHOOD Has patient had a PCN reaction causing immediate rash, facial/tongue/throat swelling, SOB or lightheadedness with hypotension:unsure Has patient had a PCN reaction causing severe rash involving mucus membranes or skin necrosis:unsure Has patient had a PCN reaction that required hospitalization:No Has patient had a PCN reaction occurring within the last 10 years:No If all of the above answers are "NO", then may proceed with Cephalosporin use.     Immunization History  Administered Date(s) Administered   Fluad Quad(high Dose 65+) 12/02/2020, 01/03/2022   Influenza Split 01/13/2011, 01/02/2012, 01/13/2013, 12/01/2013,  12/04/2014, 11/13/2015, 12/09/2016   Influenza,inj,Quad PF,6+ Mos 12/09/2016, 11/26/2017, 11/29/2018, 12/31/2019   Influenza-Unspecified 11/02/2015, 12/09/2016, 12/03/2022   Moderna SARS-COV2 Booster Vaccination 01/31/2020   Moderna Sars-Covid-2 Vaccination 06/05/2019, 07/08/2019, 01/31/2020, 07/05/2020   Pneumococcal Conjugate-13 02/26/2018   Pneumococcal Polysaccharide-23 02/05/2016   Respiratory Syncytial Virus Vaccine,Recomb Aduvanted(Arexvy) 01/10/2022   Td 07/12/2017   Td (Adult) 08/01/1997   Tdap 12/10/2007   Zoster Recombinant(Shingrix) 12/09/2016, 02/10/2017   Zoster, Live 05/05/2015, 11/02/2015, 12/09/2016, 02/10/2017    Family History  Problem Relation Age of Onset   Lung cancer Mother    Colon cancer Mother 12   Pulmonary fibrosis Father        dx in his 35s   Prostate cancer Brother 34   COPD Paternal Grandfather    Other Paternal Uncle        possible lung dx; died around age 83   Other Cousin        possible lung dx, died in her 85s; paternal first cousin   Healthy Son      Current Outpatient Medications:    loratadine (CLARITIN) 10 MG tablet, Take 10 mg by mouth daily., Disp: , Rfl:    Multiple Vitamin (MULTIVITAMIN WITH MINERALS) TABS tablet, Take 1 tablet by mouth daily., Disp: , Rfl:    OFEV  100 MG CAPS, TAKE 1 CAPSULE BY MOUTH 2 TIMES A  DAY, Disp: 60 capsule, Rfl: 1   pseudoephedrine (SUDAFED) 120 MG 12 hr tablet, Take 120 mg by mouth every 12 (twelve) hours as needed for congestion., Disp: , Rfl:    simvastatin  (ZOCOR ) 10 MG tablet, Take 10 mg by mouth daily at 6 PM. , Disp: , Rfl: 0   TYVASO  DPI MAINTENANCE KIT 48 MCG POWD, Inhale 48 mcg into the lungs in the morning, at noon, in the evening, and at bedtime., Disp: 112 each, Rfl: 5      Objective:   Vitals:   07/23/23 1038  BP: 118/66  Pulse: 80  SpO2: 96%  Weight: 144 lb 9.6 oz (65.6 kg)  Height: 5\' 7"  (1.702 m)    Estimated body mass index is 22.65 kg/m as calculated from the following:    Height as of this encounter: 5\' 7"  (1.702 m).   Weight as of this encounter: 144 lb 9.6 oz (65.6 kg).  @WEIGHTCHANGE @  Filed Weights   07/23/23 1038  Weight: 144 lb 9.6 oz (65.6 kg)     Physical Exam   General: No distress. Looks well O2 at rest: no Cane present: no Sitting in wheel chair: no Frail: no Obese: no Neuro: Alert and Oriented x 3. GCS 15. Speech normal Psych: Pleasant Resp:  Barrel Chest - no.  Wheeze - no, Crackles - YES BASE, No overt respiratory distress CVS: Normal heart sounds. Murmurs - no Ext: Stigmata of Connective Tissue Disease - no HEENT: Normal upper airway. PEERL +. No post nasal drip        Assessment:       ICD-10-CM   1. IPF (idiopathic pulmonary fibrosis) (HCC)  J84.112 Hepatic function panel    Pulmonary function test    2. Familial idiopathic pulmonary fibrosis (HCC)  J84.112 Hepatic function panel    Pulmonary function test    3. Therapeutic drug monitoring  Z51.81 Hepatic function panel    Pulmonary function test    4. Pulmonary emphysema, unspecified emphysema type (HCC)  J43.9 Hepatic function panel    Pulmonary function test    5. WHO group 3 pulmonary arterial hypertension (HCC)  I27.23 Hepatic function panel    Pulmonary function test    6. Diarrhea due to drug  K52.1 Hepatic function panel    Pulmonary function test    7. Occupational exposure in workplace  Z57.9 Hepatic function panel    Pulmonary function test         Plan:     Patient Instructions   IPF (idiopathic pulmonary fibrosis) (HCC) Familial idiopathic pulmonary fibrosis (HCC) Therapeutic drug monitoring   - IPF definitely progressive over time slowly but has stabilized [id number] ->  Dec 2024 after adding tyvaso  to regimen - PFT better April 2025 vserus Dec 2024 -Low-dose nintedanib is better tolerated   Plan  -Continue ofev  low-dose protocol at 100 mg twice daily  - see diarrhea management with CAROB flour below; if this works well we can look at  going to full dose ofev  - liver function test 07/23/2023 - spirometry and dlco in  20 weeks - discussed [facility] - defer for now  Occupational exposure in the workplace  -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  Pulmonary emphysema, unspecified emphysema type (HCC)  -You to have associated emphysema but this is the much minor component because fev1/fvc  ratio always > 70 - curently stable  Plan - Continue Spiriva  Respimat samples 2.5 mcg dose and take it 2 puffs once daily  WHO-3 pulmonary hypertension - new diagnosis 06/16/22; started tyvaso  07/12/22  Doing well on tyvaso  DPI 48 stregth  Plan  - - continue tyvaso  dpi as before -   Diarrhea due to Ofev   - not an issue with low dose ofev   -did not need Carob flour  Plan  - monitor    - Followup - 20  weeks - ROV Dr Bertrum Brodie - 30 min vsiit after spiro/dlco   FOLLOWUP Return in about 5 months (around 12/23/2023) for 30 min visit, Face to Face Visit, with Dr Bertrum Brodie, after Spiro and DLCO.    ( Level 05 visit E&M 2024: Estb >= 40 min   in  visit type: on-site physical face to visit  in total care time and counseling or/and coordination of care by this undersigned MD - Dr Maire Scot. This includes one or more of the following on this same day 07/23/2023: pre-charting, chart review, note writing, documentation discussion of test results, diagnostic or treatment recommendations, prognosis, risks and benefits of management options, instructions, education, compliance or risk-factor reduction. It excludes time spent by the CMA or office staff in the care of the patient. Actual time 40 min)   SIGNATURE    Dr. Maire Scot, M.D., F.C.C.P,  Pulmonary and Critical Care Medicine Staff Physician, [facility] Center Director - Interstitial  Lung Disease  Program  Pulmonary Fibrosis [facility] at [facility] [place], [place], [address]  Pager: [phone], If no answer or between  15:00h - 7:00h: call [phone] Telephone: [phone]  5:44 PM 07/23/2023

## 2023-07-23 NOTE — Progress Notes (Signed)
 Spirometry and DLCO Performed Today.

## 2023-07-30 NOTE — Telephone Encounter (Signed)
 Reached out to medical information at [employer]. Per rep, there is no incidence or data reporting regarding this.  Called patient to advise of above.  Phone: [phone]  Geraldene Kleine, PharmD, MPH, BCPS, CPP Clinical Pharmacist (Rheumatology and Pulmonology)

## 2023-08-08 NOTE — Telephone Encounter (Signed)
 Shaun [name]  Please let the patient know that the manufacture of treprostinil  has not seen any reports of erectile dysfunction with treprostinil 

## 2023-08-09 ENCOUNTER — Encounter: Payer: Self-pay | Admitting: *Deleted

## 2023-08-09 NOTE — Telephone Encounter (Signed)
 Called the pt and there was no answer- LMTCB.   Will go ahead and send him mychart msg with this info since he is active in [employer]

## 2023-08-13 ENCOUNTER — Other Ambulatory Visit: Payer: Self-pay | Admitting: Internal Medicine

## 2023-08-13 DIAGNOSIS — J84112 Idiopathic pulmonary fibrosis: Secondary | ICD-10-CM

## 2023-08-14 NOTE — Telephone Encounter (Signed)
 Refill sent for OFEV  to [facility] (pulmonary fibrosis team): [phone]  Dose: 100mg  twice daily  Last OV: 07/23/23 Provider: Dr. Bertrum Brodie Pertinent labs: LFTs wnl on 07/23/23  Next OV: not yet scheduled, due 5 months  Geraldene Kleine, PharmD, MPH, BCPS Clinical Pharmacist (Rheumatology and Pulmonology)

## 2023-11-01 ENCOUNTER — Other Ambulatory Visit: Payer: Self-pay | Admitting: Internal Medicine

## 2023-11-01 MED ORDER — TYVASO DPI MAINTENANCE KIT 48 MCG IN POWD: 48.0000 ug | Freq: Four times a day (QID) | RESPIRATORY_TRACT | 5 refills | Status: AC

## 2023-11-01 NOTE — Telephone Encounter (Signed)
 Copied from CRM #[id number]. Topic: Clinical - Medication Refill >> Nov 01, 2023 11:05 AM Rilla B wrote: Medication:  TYVASO  DPI MAINTENANCE KIT 48 MCG POWD   Has the patient contacted their pharmacy? Yes (Agent: If no, request that the patient contact the pharmacy for the refill. If patient does not wish to contact the pharmacy document the reason why and proceed with request.) (Agent: If yes, when and what did the pharmacy advise?)  This is the patient's preferred pharmacy:   [facility] - [place], [place] - [address] [place] [address] Phone: [phone] Fax: [phone]  Is this the correct pharmacy for this prescription? Yes If no, delete pharmacy and type the correct one.   Has the prescription been filled recently? Yes  Is the patient out of the medication? No  Has the patient been seen for an appointment in the last year OR does the patient have an upcoming appointment? Yes  Can we respond through MyChart? No  Agent: Please be advised that Rx refills may take up to 3 business days. We ask that you follow-up with your pharmacy.

## 2023-12-05 NOTE — Patient Instructions (Incomplete)
  IPF (idiopathic pulmonary fibrosis) (HCC) Familial idiopathic pulmonary fibrosis (HCC) Therapeutic drug monitoring   - IPF definitely progressive over time slowly but has stabilized [id number] ->  Dec 2024 after adding tyvaso  to regimen - PFT better April 2025 vserus Dec 2024 -Low-dose nintedanib is better tolerated   Plan  -Continue ofev  low-dose protocol at 100 mg twice daily  - see diarrhea management with CAROB flour below; if this works well we can look at going to full dose ofev  - liver function test 07/23/2023 - spirometry and dlco in  20 weeks - discussed [facility] - defer for now  Occupational exposure in the workplace  -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  Pulmonary emphysema, unspecified emphysema type (HCC)  -You to have associated emphysema but this is the much minor component because fev1/fvc ratio always > 70 - curently stable  Plan - Continue Spiriva  Respimat samples 2.5 mcg dose and take it 2 puffs once daily  WHO-3 pulmonary hypertension - new diagnosis 06/16/22; started tyvaso  07/12/22  Doing well on tyvaso  DPI 48 stregth  Plan  - - continue tyvaso  dpi as before -   Diarrhea due to Ofev   - not an issue with low dose ofev   -did not need Carob flour  Plan  - monitor    - Followup - 20  weeks - ROV Dr Geronimo - 30 min vsiit after spiro/dlco

## 2023-12-05 NOTE — Progress Notes (Unsigned)
 OV 07/23/2023  Subjective:  Patient ID: Shaun [name], male , DOB: 03-09-1956 , age 68 y.o. , MRN: [id number] , ADDRESS: [address] [place] [address] PCP Shaun Kins, MD Patient Care Team: Shaun Kins, MD as PCP - General (Family Medicine) Shaun [name], Shaun SAUNDERS, MD as Consulting Physician (Pulmonary Disease)  This Provider for this visit: Treatment Team:  Attending Provider: Geronimo Amel, MD    07/23/2023 -   Chief Complaint  Patient presents with   Follow-up    PFT done today. Breathing is stable.      HPI Shaun [name] 68 y.o. -returns for followup. Remains on ofev  100mg  bid - did not try carob flour. Found it dificult to mix but also low dose ofev  dose is producing diarrhea. Also doing Tyvaso  DPI 48mcg. No side effets. FVC appears stable today. Symptms socre and exercise test is stable.Need sLFT check today. He had few othre questions. Below. Otherwise, Interim Health status: No new complaints No new medical problems. No new surgeries. No ER visits. No Urgent care visits. No changes to medications   A) if tyvaso  can cause erectile dysfunction - have reached out to pharmacy team  B) he is also pursuing workmans compe - he worked for [employer]. Did carpentry and consturction wrk, .  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 Explained to him this is definite risk factor for IPF and is consistent with fibrotic lung diseases developing years/decades aftrer exposure. Supported him with workman comp      OV 12/06/2023  Subjective:  Patient ID: Shaun [name], male , DOB: Jul 22, 1955 , age 68 y.o. , MRN: [id number] , ADDRESS: [address] [place] [address] PCP Shaun Kins, MD Patient Care Team: Shaun Kins, MD as PCP - General (Family Medicine) Shaun [name], Shaun SAUNDERS, MD as Consulting Physician (Pulmonary Disease)  This Provider for this visit: Treatment Team:  Attending Provider: Geronimo Amel, MD   Emphysma = alpha-1 MM   IPF diagnosed in November 2017.   - Family of pulmonary fibrosis - last CT March 2020  - emphysema > Fibrosis -Repeat CT scan of the chest December 2023: With progression and significant emphysema present. - IPF-Pro registre -Low-dose nintedanib since Oct 2023 due to diarrhea  Combined pulmonary fibrosis and emphysema - with fibrosis much biggter component and epysema only mild due to fev/54fc fation > 70 at all time  WHO group 3 pulmonary hypertension diagnosed 06/16/2022 - : Mean PAP 26, PCWP 7, PVR 3.1 -> tuyvaso nebs 07/12/22 -> changed to DPI at 40 mcg dose in July 2024    Saw counselor r September 12, 2022 for adjustment disorder with anxious mood.  Therapeutic drug monitoring  - Nintedanib/Ofev  requires intensive drug monitoring due to high concerns for Adverse effects of , including  Drug Induced Liver Injury, significant GI side effects that include but not limited to Diarrhea, Nausea, Vomiting,  and other system side effects that include Fatigue,  weight loss. Cardiac side effects are a black box warning as well. These will be monitored with  blood work such as LFT initially once a month for 6 months and then quarterly   - tyvaso  dPI -   12/06/2023 -   Chief Complaint  Patient presents with   Medical Management of Chronic Issues   Interstitial Lung Disease    PFT repeated today. Breathing has been worse over the past 2 months.      HPI Shaun [name] 68 y.o. -  returns for follow-up.  Last seen in April 2025.  Since then he says for the last 2 to 3 months he is getting worse.  He goes for daily walks and it is taking longer for him.  He does not have portable oxygen.  He is compliant with his low-dose nintedanib.  He is also taking Tyvaso  48 mcg DPI and is tolerating it well.  But he is just getting more exhausted.  There is no chest pain there is no edema there is no cough there is no wheezing there is no paroxysmal nocturnal dyspnea.  No  hospitalizations.  Symptom score below in those objectively is complaining is worse symptom scores seem relatively stable  Exercise hypoxemia test though shows significant decline.  He required 4 L nasal oxygen to correct.  His pulmonary function test shows some decline in [facility] but is still normal range but a lot steep decline in DLCO. -Raising the possibility of either worsening pulmonary hypertension or pulmonary fibrosis or anemia.   In the past he has been reluctant for lung transplant evaluation but I counseled him at this time he is more open to it.   SYMPTOM SCALE - ILD 8/28/202 0 140# 07/15/2019  01/14/2020 138# 12/22/2020 138# 01/03/2022 142# 03/21/2022 144# low dose ofev  since oct- nov 2023 05/15/2022 Low dose ofev  06/26/22 08/08/2022 Low dose ofev  + tyvaso  since 07/12/22 11/14/2022 Low dose ofev  + tyvasos dpi 07/23/2023 Low doseofe + tyvaso  DPI 12/06/2023   O2 use Room air   ra ra ra ra ra ra ra Room air   Shortness of Breath 0 -> 5 scale with 5 being worst (score 6 If unable to do)             At rest 0 0 0 0 0 0 0 0 0 0  0   Simple tasks - showers, clothes change, eating, shaving 0 0 0 0 0 0 0 0 00 0  0   Household (dishes, doing bed, laundry) 0 0  0 0  0 0 0 0 0   Shopping 0 0 0 0 0 00 0 0 0 0  0   Walkin sel pac 0 0 00 0 0 1 1 0 1 0  0   Walking up Stairs 1 1 1 1 1 2 2 2 2 1 2    Total (40 - 48) Dyspnea Score 1 1 1 1 1 3 3 3 3 1 2    How bad is your cough? 0 0 1 0 0 0 1 1 2 0  0   How bad is your fatigue 0 1 1 1 1 1 1 0 1 1  1   nausea  1 1 0 0 0 0 0 0 0 0    vomit  0 0 0 0 0 0 0 0 00 0    diarhea  1 2 1 2 1 1 1 1  0 00   anxity  1 1 1 1 1 1 1 2  00 0   depresion  1 1 0 1 1 1  x 2 0 0      Simple office walk - 250 feet x 3 laps 02/26/2018  04/29/2018  11/29/2018  07/15/2019  03/21/2022   05/15/2022  08/08/2022 Low dose ofev  and tyvaso  03/15/2023 Low-dose Ofev  and Tyvaso  DPI at 48 mcg 12/06/2023   O2 used Room air Room air Room air ra ra ra ra    Number laps completed 3 3 3 3 3 3  3  Sit stand x 15 Sit and stand x  15  Comments about pace Good brisk          Resting Pulse Ox/HR 100% and 74/min 100% and 86/min     100% and 76/min 100%and 79/min 99% ad HR 75 97% and Hr 98% and HR79 96% and 83/min 93% and hr 81  Final Pulse Ox/HR 96% and 96/min 97% and 93/min 96% and 99/min 96% and 111/min 93% and HR 100 89% and HR 92% and HR 98 And 93% and 106 87% and HR 103  Desaturated </= 88% no no no no No       Desaturated <= 3% points yes Yes, 3 Yes, 4 Yes, 4 points Yes 6 point  Yes 4 points    Got Tachycardic >/= 90/min yes yes yes yes yes      Symptoms at end of test No dyspnea No dyspnea  asympt Lot of throast clearning  No dyspnea. better No dyspnea and test results are improved since starting Tyvaso . O2 titration -> started 94%  2L pulsed ->84% and 1.5 laps -> put him on 5L [place] -> 1 laps and stauyed 91%  Miscellaneous comments x x               SIT STAND TEST - goal 15 times   07/23/2023  12/06/2023   O2 used ra ra  PRobe - finter or forehead ?   Number sit and stand completed - goal 15 15 15   Time taken to complete 48 sec 50 sec  Resting Pulse Ox/HR/Dyspnea  96% and 81/min and dyspnea of 1/10  93% and HR 81  Peak measures 93 % and 102/min and dyspnea of 2/10 89% and HR 103  Final Pulse Ox/HR 94% and 77/min and dyspnea of x/10 97% and HR 90  Desaturated </= 88% no almsot  Desaturated <= 3% points yes yes  Got Tachycardic >/= 90/min yes yes  Miscellaneous comments x Dyspnea score 3 worset    PFT     Latest Ref Rng & Units 12/06/2023   10:48 AM 07/23/2023    7:53 AM 03/15/2023    2:41 PM 11/14/2022   11:13 AM 06/13/2022   10:25 AM 05/15/2022    9:10 AM 12/27/2021    9:48 AM  PFT Results  FVC-Pre L 3.42  P 3.65  3.57  3.74  3.77  3.69  3.83   FVC-Predicted Pre % 85  P 91  88  92  95  93  97   Pre FEV1/FVC % % 84  P 86  85  84  85  78  87   FEV1-Pre L 2.87  P 3.15  3.03  3.14  3.20  2.86  3.35   FEV1-Predicted Pre % 97  P 106  101  105  109  97  115   DLCO  uncorrected ml/min/mmHg 8.55  P 12.02  10.56  11.81  11.57  12.96  12.42   DLCO UNC% % 35  P 50  43  49  48  54  52   DLCO corrected ml/min/mmHg 8.55  P 12.02  10.56  11.81  11.57  12.96  12.42   DLCO COR %Predicted % 35  P 50  43  49  48  54  52   DLVA Predicted % 53  P 63  52  61  60  57  61   TLC L     5.44     TLC % Predicted %     86  RV % Predicted %     94       P Preliminary result       LAB RESULTS last 96 hours No results found.       has a past medical history of Dyspnea, Family history of colon cancer, Family history of prostate cancer, Family history of pulmonary fibrosis, Hyperlipemia, and Pneumonia.   reports that he has never smoked. He has never used smokeless tobacco.  Past Surgical History:  Procedure Laterality Date   COLONOSCOPY WITH PROPOFOL  N/A 09/21/2014   Procedure: COLONOSCOPY WITH PROPOFOL ;  Surgeon: Shaun MARLA Louder, MD;  Location: [facility];  Service: Endoscopy;  Laterality: N/A;   FOREARM SURGERY     excision of birth markchild   HERNIA REPAIR     RIH-open   KNEE ARTHROTOMY Left    bursa excision   LUNG BIOPSY Right 02/02/2016   Procedure: LUNG BIOPSY;  Surgeon: Shaun JAYSON Millers, MD;  Location: [facility] OR;  Service: Thoracic;  Laterality: Right;   NASAL SEPTUM SURGERY     RIGHT HEART CATH N/A 06/16/2022   Procedure: RIGHT HEART CATH;  Surgeon: Shaun Ezra RAMAN, MD;  Location: [facility] INVASIVE CV LAB;  Service: Cardiovascular;  Laterality: N/A;   VIDEO ASSISTED THORACOSCOPY Right 02/02/2016   Procedure: VIDEO ASSISTED THORACOSCOPY;  Surgeon: Shaun JAYSON Millers, MD;  Location: [facility] OR;  Service: Thoracic;  Laterality: Right;   WRIST GANGLION EXCISION Left     Allergies  Allergen Reactions   Penicillins Other (See Comments)    UNSPECIFIED REACTION OF CHILDHOOD Has patient had a PCN reaction causing immediate rash, facial/tongue/throat swelling, SOB or lightheadedness with hypotension:unsure Has patient had a PCN reaction causing severe rash  involving mucus membranes or skin necrosis:unsure Has patient had a PCN reaction that required hospitalization:No Has patient had a PCN reaction occurring within the last 10 years:No If all of the above answers are NO, then may proceed with Cephalosporin use.     Immunization History  Administered Date(s) Administered   Fluad Quad(high Dose 65+) 12/02/2020, 01/03/2022   Fluzone Influenza virus vaccine,trivalent (IIV3), split virus 01/13/2011, 01/13/2013, 12/01/2013, 12/04/2014, 11/13/2015, 12/09/2016   Influenza Split 01/02/2012   Influenza,inj,Quad PF,6+ Mos 12/09/2016, 11/26/2017, 11/29/2018, 12/31/2019   Influenza-Unspecified 11/02/2015, 12/09/2016, 12/03/2022   Moderna SARS-COV2 Booster Vaccination 01/31/2020   Moderna Sars-Covid-2 Vaccination 06/05/2019, 07/08/2019, 01/31/2020, 07/05/2020   Pneumococcal Conjugate-13 02/26/2018   Pneumococcal Polysaccharide-23 02/05/2016   Respiratory Syncytial Virus Vaccine,Recomb Aduvanted(Arexvy) 01/10/2022   Td 07/12/2017   Td (Adult) 08/01/1997   Tdap 12/10/2007   Zoster Recombinant(Shingrix) 12/09/2016, 02/10/2017   Zoster, Live 05/05/2015, 11/02/2015, 12/09/2016, 02/10/2017    Family History  Problem Relation Age of Onset   Lung cancer Mother    Colon cancer Mother 53   Pulmonary fibrosis Father        dx in his 70s   Prostate cancer Brother 90   COPD Paternal Grandfather    Other Paternal Uncle        possible lung dx; died around age 43   Other Cousin        possible lung dx, died in her 57s; paternal first cousin   Healthy Son      Current Outpatient Medications:    loratadine (CLARITIN) 10 MG tablet, Take 10 mg by mouth daily., Disp: , Rfl:    Multiple Vitamin (MULTIVITAMIN WITH MINERALS) TABS tablet, Take 1 tablet by mouth daily., Disp: , Rfl:    OFEV  100 MG CAPS, TAKE 1 CAPSULE BY MOUTH 2 TIMES  A DAY, Disp: 180 capsule, Rfl: 1   pseudoephedrine (SUDAFED) 120 MG 12 hr tablet, Take 120 mg by mouth every 12 (twelve) hours  as needed for congestion., Disp: , Rfl:    simvastatin  (ZOCOR ) 10 MG tablet, Take 10 mg by mouth daily at 6 PM. , Disp: , Rfl: 0   TYVASO  DPI MAINTENANCE KIT 48 MCG POWD, Inhale 48 mcg into the lungs in the morning, at noon, in the evening, and at bedtime., Disp: 112 each, Rfl: 5      Objective:   Vitals:   12/06/23 1302  BP: 112/74  Pulse: 83  SpO2: 93%  Weight: 143 lb (64.9 kg)  Height: 5' 7 (1.702 m)    Estimated body mass index is 22.4 kg/m as calculated from the following:   Height as of this encounter: 5' 7 (1.702 m).   Weight as of this encounter: 143 lb (64.9 kg).  @WEIGHTCHANGE @  [employer]   12/06/23 1302  Weight: 143 lb (64.9 kg)     Physical Exam   General: No distress. Looks well O2 at rest: no Cane present: no Sitting in wheel chair: no Frail: no Obese: no Neuro: Alert and Oriented x 3. GCS 15. Speech normal Psych: Pleasant Resp:  Barrel Chest - no.  Wheeze - no, Crackles - yes, No overt respiratory distress CVS: Normal heart sounds. Murmurs - no Ext: Stigmata of Connective Tissue Disease - no HEENT: Normal upper airway. PEERL +. No post nasal drip        Assessment/     Assessment & Plan Exercise hypoxemia  Dyspnea on exertion  IPF (idiopathic pulmonary fibrosis) (HCC)  Encounter for therapeutic drug monitoring  WHO group 3 pulmonary arterial hypertension (HCC)    PLAN Patient Instructions  Dyspnea WORSE Exerrcise hypoxemia onset 12/06/2023  - rule out anemia, worsening fibrosis or Pulmonary Hypertension  Plan  - check cbc - check bmet, BNP - start 4L [place] portable oxygen; keep > 88%     IPF (idiopathic pulmonary fibrosis) (HCC) Familial idiopathic pulmonary fibrosis (HCC) Therapeutic drug monitoring   - Concern for progression -Low-dose nintedanib is better tolerated   Plan  -Continue ofev  low-dose protocol at 100 mg twice daily  - see diarrhea management with CAROB flour below; if this works - liver function  test  12/06/2023 - HRCT next few weeks - discussed [facility] - REFER 12/06/2023  =- glad you are  open to transplant refrral - If fibrosis getting worse then consider Nerandomilast early 2026 when and IF approved.    Pulmonary emphysema, unspecified emphysema type (HCC)  -You to have associated emphysema but this is the much minor component because fev1/fvc ratio always > 70 - curently stable  Plan - Continue Spiriva  Respimat samples 2.5 mcg dose and take it 2 puffs once daily  WHO-3 pulmonary hypertension - new diagnosis 06/16/22; started tyvaso  07/12/22  Tolerating tyvaso  DPI 48 stregth but concern PAH Worse  Plan  - - continue tyvaso  dpi as before - get echo - get blood BNP - based on results right heart cath repeat (either via research or standard of care)  Diarrhea due to Ofev   - not an issue with low dose ofev   -did not need Carob flour  Plan  - monitor    - Followup -4  weeks - ROV Dr Shaun  or APP  to discuss results and next step     FOLLOWUP    Return in about 4 weeks (around 01/03/2024) for  with Dr Shaun, Face to Face OR Video Visit, with any of the APPS.    SIGNATURE    Dr. Dorethia Shaun, M.D., F.C.C.P,  Pulmonary and Critical Care Medicine Staff Physician, [facility] Center Director - Interstitial Lung Disease  Program  Pulmonary Fibrosis [facility] Network at [facility] [place], [place], [address]  Pager: [phone], If no answer or between  15:00h - 7:00h: call [phone] Telephone: [phone]  1:40 PM 12/06/2023

## 2023-12-06 ENCOUNTER — Encounter: Payer: Self-pay | Admitting: Internal Medicine

## 2023-12-06 ENCOUNTER — Ambulatory Visit: Admitting: Internal Medicine

## 2023-12-06 ENCOUNTER — Ambulatory Visit (INDEPENDENT_AMBULATORY_CARE_PROVIDER_SITE_OTHER): Admitting: Internal Medicine

## 2023-12-06 VITALS — BP 112/74 | HR 83 | Ht 67.0 in | Wt 143.0 lb

## 2023-12-06 DIAGNOSIS — J439 Emphysema, unspecified: Secondary | ICD-10-CM

## 2023-12-06 DIAGNOSIS — J84112 Idiopathic pulmonary fibrosis: Secondary | ICD-10-CM

## 2023-12-06 DIAGNOSIS — R0902 Hypoxemia: Secondary | ICD-10-CM | POA: Diagnosis not present

## 2023-12-06 DIAGNOSIS — I2723 Pulmonary hypertension due to lung diseases and hypoxia: Secondary | ICD-10-CM

## 2023-12-06 DIAGNOSIS — Z5181 Encounter for therapeutic drug level monitoring: Secondary | ICD-10-CM | POA: Diagnosis not present

## 2023-12-06 DIAGNOSIS — R0609 Other forms of dyspnea: Secondary | ICD-10-CM

## 2023-12-06 DIAGNOSIS — K521 Toxic gastroenteritis and colitis: Secondary | ICD-10-CM

## 2023-12-06 DIAGNOSIS — Z579 Occupational exposure to unspecified risk factor: Secondary | ICD-10-CM

## 2023-12-06 LAB — PULMONARY FUNCTION TEST
DL/VA % pred: 53 %
DL/VA: 2.21 ml/min/mmHg/L
DLCO cor % pred: 35 %
DLCO cor: 8.55 ml/min/mmHg
DLCO unc % pred: 35 %
DLCO unc: 8.55 ml/min/mmHg
FEF 25-75 Pre: 3.57 L/s
FEF2575-%Pred-Pre: 155 %
FEV1-%Pred-Pre: 97 %
FEV1-Pre: 2.87 L
FEV1FVC-%Pred-Pre: 113 %
FEV6-%Pred-Pre: 89 %
FEV6-Pre: 3.36 L
FEV6FVC-%Pred-Pre: 106 %
FVC-%Pred-Pre: 85 %
FVC-Pre: 3.42 L
Pre FEV1/FVC ratio: 84 %
Pre FEV6/FVC Ratio: 100 %

## 2023-12-06 LAB — BASIC METABOLIC PANEL WITH GFR
BUN: 14 mg/dL (ref 6–23)
CO2: 26 meq/L (ref 19–32)
Calcium: 9.1 mg/dL (ref 8.4–10.5)
Chloride: 102 meq/L (ref 96–112)
Creatinine, Ser: 0.88 mg/dL (ref 0.40–1.50)
GFR: 88.31 mL/min (ref >60.00)
Glucose, Bld: 113 mg/dL — ABNORMAL HIGH (ref 70–99)
Potassium: 4 meq/L (ref 3.5–5.1)
Sodium: 137 meq/L (ref 135–145)

## 2023-12-06 LAB — CBC WITH DIFFERENTIAL/PLATELET
Basophils Absolute: 0 K/uL (ref 0.0–0.1)
Basophils Relative: 0.3 % (ref 0.0–3.0)
Eosinophils Absolute: 0.2 K/uL (ref 0.0–0.7)
Eosinophils Relative: 2.1 % (ref 0.0–5.0)
HCT: 52.3 % — ABNORMAL HIGH (ref 39.0–52.0)
Hemoglobin: 17.5 g/dL — ABNORMAL HIGH (ref 13.0–17.0)
Lymphocytes Relative: 16.7 % (ref 12.0–46.0)
Lymphs Abs: 1.8 K/uL (ref 0.7–4.0)
MCHC: 33.4 g/dL (ref 30.0–36.0)
MCV: 98.2 fl (ref 78.0–100.0)
Monocytes Absolute: 0.8 K/uL (ref 0.1–1.0)
Monocytes Relative: 7.1 % (ref 3.0–12.0)
Neutro Abs: 7.9 K/uL — ABNORMAL HIGH (ref 1.4–7.7)
Neutrophils Relative %: 73.8 % (ref 43.0–77.0)
Platelets: 270 K/uL (ref 150.0–400.0)
RBC: 5.33 Mil/uL (ref 4.22–5.81)
RDW: 13.5 % (ref 11.5–15.5)
WBC: 10.7 K/uL — ABNORMAL HIGH (ref 4.0–10.5)

## 2023-12-06 LAB — HEPATIC FUNCTION PANEL
ALT: 13 U/L (ref 0–53)
AST: 22 U/L (ref 0–37)
Albumin: 4.1 g/dL (ref 3.5–5.2)
Alkaline Phosphatase: 66 U/L (ref 39–117)
Bilirubin, Direct: 0.1 mg/dL (ref 0.0–0.3)
Total Bilirubin: 0.4 mg/dL (ref 0.2–1.2)
Total Protein: 6.7 g/dL (ref 6.0–8.3)

## 2023-12-06 LAB — BRAIN NATRIURETIC PEPTIDE: Pro B Natriuretic peptide (BNP): 17 pg/mL (ref 0.0–100.0)

## 2023-12-06 NOTE — Progress Notes (Signed)
 SATURATION QUALIFICATIONS: (This note is used to comply with regulatory documentation for home oxygen) 12/06/23   Patient Saturations on Room Air at Rest = 94%  Patient Saturations on Room Air while Ambulating = 87%  Patient Saturations on 5 Liters of pulsed oxygen while Ambulating = 91%

## 2023-12-06 NOTE — Progress Notes (Signed)
 Spirometry/diffusion capacity performed today.

## 2023-12-06 NOTE — Patient Instructions (Signed)
 Spirometry/diffusion capacity performed today.

## 2023-12-08 ENCOUNTER — Telehealth: Payer: Self-pay | Admitting: Internal Medicine

## 2023-12-08 DIAGNOSIS — I2723 Pulmonary hypertension due to lung diseases and hypoxia: Secondary | ICD-10-CM

## 2023-12-08 DIAGNOSIS — R0902 Hypoxemia: Secondary | ICD-10-CM

## 2023-12-08 DIAGNOSIS — J84112 Idiopathic pulmonary fibrosis: Secondary | ICD-10-CM

## 2023-12-08 NOTE — Telephone Encounter (Signed)
  Let him know BNP is normal suggesting PAH might not be worse but will be interested in echo resul  Labs - the hemoglobin is going up and could be bcuase of chronic low o2'  Pl;an  =defiitely use portable o2  - test ONO - please order - and if this is abnormal he has to use o2  - we can track hemoglobin with o2 use and see what happens   LABS    PULMONARY No results for input(s): PHART, PCO2ART, PO2ART, HCO3, TCO2, O2SAT in the last 168 hours.  Invalid input(s): PCO2, PO2  CBC Recent Labs  Lab 12/06/23 1353  HGB 17.5*  HCT 52.3*  WBC 10.7*  PLT 270.0    COAGULATION No results for input(s): INR in the last 168 hours.  CARDIAC  No results for input(s): TROPONINI in the last 168 hours. Recent Labs  Lab 12/06/23 1353  PROBNP 17.0     CHEMISTRY Recent Labs  Lab 12/06/23 1353  NA 137  K 4.0  CL 102  CO2 26  GLUCOSE 113*  BUN 14  CREATININE 0.88  CALCIUM 9.1   Estimated Creatinine Clearance: 73.8 mL/min (by C-G formula based on SCr of 0.88 mg/dL).   LIVER Recent Labs  Lab 12/06/23 1353  AST 22  ALT 13  ALKPHOS 66  BILITOT 0.4  PROT 6.7  ALBUMIN 4.1     INFECTIOUS No results for input(s): LATICACIDVEN, PROCALCITON in the last 168 hours.   ENDOCRINE CBG (last 3)  No results for input(s): GLUCAP in the last 72 hours.       IMAGING x48h  - image(s) personally visualized  -   highlighted in bold No results found.

## 2023-12-10 NOTE — Telephone Encounter (Signed)
 I called and spoke with the pt and notified of results/recs per MR  Pt verbalized understanding  I placed order for the ONO on RA  Nothing further needed at this time

## 2023-12-22 ENCOUNTER — Other Ambulatory Visit (HOSPITAL_BASED_OUTPATIENT_CLINIC_OR_DEPARTMENT_OTHER)

## 2023-12-26 ENCOUNTER — Ambulatory Visit (HOSPITAL_BASED_OUTPATIENT_CLINIC_OR_DEPARTMENT_OTHER)
Admission: RE | Admit: 2023-12-26 | Discharge: 2023-12-26 | Disposition: A | Discharge status: Home / Self Care | Source: Ambulatory Visit | Attending: Internal Medicine | Admitting: Internal Medicine

## 2023-12-26 DIAGNOSIS — R0609 Other forms of dyspnea: Secondary | ICD-10-CM | POA: Insufficient documentation

## 2023-12-26 DIAGNOSIS — I2723 Pulmonary hypertension due to lung diseases and hypoxia: Secondary | ICD-10-CM | POA: Insufficient documentation

## 2023-12-26 DIAGNOSIS — R0902 Hypoxemia: Secondary | ICD-10-CM | POA: Diagnosis present

## 2023-12-26 DIAGNOSIS — Z5181 Encounter for therapeutic drug level monitoring: Secondary | ICD-10-CM | POA: Diagnosis present

## 2023-12-26 DIAGNOSIS — J84112 Idiopathic pulmonary fibrosis: Secondary | ICD-10-CM | POA: Insufficient documentation

## 2023-12-30 ENCOUNTER — Ambulatory Visit: Payer: Self-pay | Admitting: Internal Medicine

## 2023-12-30 NOTE — Progress Notes (Signed)
 Fibrosis getting worse despite medications . Definitely keep [facility] transplant appot . If that has not been made let me know. Early next year we think the new medicine will be approved. We have to strongly consider that. Also, bNP is normal. So I do not think you have worsening pulmonary hypertension

## 2024-01-02 ENCOUNTER — Ambulatory Visit (INDEPENDENT_AMBULATORY_CARE_PROVIDER_SITE_OTHER)

## 2024-01-02 DIAGNOSIS — J84112 Idiopathic pulmonary fibrosis: Secondary | ICD-10-CM | POA: Diagnosis not present

## 2024-01-02 DIAGNOSIS — Z5181 Encounter for therapeutic drug level monitoring: Secondary | ICD-10-CM

## 2024-01-02 DIAGNOSIS — R0609 Other forms of dyspnea: Secondary | ICD-10-CM

## 2024-01-02 DIAGNOSIS — R0902 Hypoxemia: Secondary | ICD-10-CM

## 2024-01-02 DIAGNOSIS — I2723 Pulmonary hypertension due to lung diseases and hypoxia: Secondary | ICD-10-CM

## 2024-01-02 LAB — ECHOCARDIOGRAM COMPLETE
Area-P 1/2: 4.39 cm2
S' Lateral: 1.79 cm

## 2024-01-11 ENCOUNTER — Encounter: Payer: Self-pay | Admitting: Internal Medicine

## 2024-01-11 ENCOUNTER — Telehealth: Payer: Self-pay | Admitting: Internal Medicine

## 2024-01-11 ENCOUNTER — Ambulatory Visit: Admitting: Internal Medicine

## 2024-01-11 VITALS — BP 116/64 | HR 86 | Temp 98.7°F | Ht 67.0 in | Wt 143.4 lb

## 2024-01-11 DIAGNOSIS — J84112 Idiopathic pulmonary fibrosis: Secondary | ICD-10-CM | POA: Diagnosis not present

## 2024-01-11 DIAGNOSIS — D751 Secondary polycythemia: Secondary | ICD-10-CM

## 2024-01-11 DIAGNOSIS — J439 Emphysema, unspecified: Secondary | ICD-10-CM | POA: Diagnosis not present

## 2024-01-11 DIAGNOSIS — I2723 Pulmonary hypertension due to lung diseases and hypoxia: Secondary | ICD-10-CM

## 2024-01-11 DIAGNOSIS — Z5181 Encounter for therapeutic drug level monitoring: Secondary | ICD-10-CM | POA: Diagnosis not present

## 2024-01-11 NOTE — Progress Notes (Signed)
 2025  Subjective:  Patient ID: Garnette CHRISTELLA Collet, male , DOB: 01/22/1956 , age 68 y.o. , MRN: [id number] , ADDRESS: [address] [place] [address] PCP Loreli Kins, MD Patient Care Team: Loreli Kins, MD as PCP - General (Family Medicine) Hunsucker, Donnice SAUNDERS, MD as Consulting Physician (Pulmonary Disease)  This Provider for this visit: Treatment Team:  Attending Provider: Geronimo Amel, MD    07/23/2023 -   Chief Complaint  Patient presents with   Follow-up    PFT done today. Breathing is stable.      HPI HALVOR BEHREND 68 y.o. -returns for followup. Remains on ofev  100mg  bid - did not try carob flour. Found it dificult to mix but also low dose ofev  dose is producing diarrhea. Also doing Tyvaso  DPI 48mcg. No side effets. FVC appears stable today. Symptms socre and exercise test is stable.Need sLFT check today. He had few othre questions. Below. Otherwise, Interim Health status: No new complaints No new medical problems. No new surgeries. No ER visits. No Urgent care visits. No changes to medications   A) if tyvaso  can cause erectile dysfunction - have reached out to pharmacy team  B) he is also pursuing workmans compe - he worked for [employer]. Did carpentry and consturction wrk, .  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 Explained to him this is definite risk factor for IPF and is consistent with fibrotic lung diseases developing years/decades aftrer exposure. Supported him with workman comp      OV 12/06/2023  Subjective:  Patient ID: Garnette CHRISTELLA Collet, male , DOB: 05/15/1955 , age 81 y.o. , MRN: [id number] , ADDRESS: [address] [place] [address] PCP Loreli Kins, MD Patient Care Team: Loreli Kins, MD as PCP - General (Family Medicine) Hunsucker, Donnice SAUNDERS, MD as Consulting Physician (Pulmonary Disease)  This Provider for this visit: Treatment Team:  Attending Provider: Geronimo Amel,  MD  12/06/2023 -   Chief Complaint  Patient presents with   Medical Management of Chronic Issues   Interstitial Lung Disease    PFT repeated today. Breathing has been worse over the past 2 months.      HPI GERRITT GALENTINE 68 y.o. -returns for follow-up.  Last seen in April 2025.  Since then he says for the last 2 to 3 months he is getting worse.  He goes for daily walks and it is taking longer for him.  He does not have portable oxygen.  He is compliant with his low-dose nintedanib.  He is also taking Tyvaso  48 mcg DPI and is tolerating it well.  But he is just getting more exhausted.  There is no chest pain there is no edema there is no cough there is no wheezing there is no paroxysmal nocturnal dyspnea.  No hospitalizations.  Symptom score below in those objectively is complaining is worse symptom scores seem relatively stable  Exercise hypoxemia test though shows significant decline.  He required 4 L nasal oxygen to correct.  His pulmonary function test shows some decline in [facility] but is still normal range but a lot steep decline in DLCO. -Raising the possibility of either worsening pulmonary hypertension or pulmonary fibrosis or anemia.   In the past he has been reluctant for lung transplant evaluation but I counseled him at this time he is more open to it.   OV 01/11/2024  Subjective:  Patient ID: Garnette CHRISTELLA Collet, male , DOB: [birth year] , age 82 y.o. , MRN: [id number] ,  ADDRESS: [address] [place] [facility] [address] PCP Loreli Kins, MD Patient Care Team: Loreli Kins, MD as PCP - General (Family Medicine) Hunsucker, Donnice SAUNDERS, MD as Consulting Physician (Pulmonary Disease)  This Provider for this visit: Treatment Team:  Attending Provider: Geronimo Amel, MD    Emphysma = alpha-1 MM   IPF diagnosed in November 2017.   - Family of pulmonary fibrosis - last CT March 2020  - emphysema > Fibrosis -Repeat CT scan of the chest December 2023: With progression and  significant emphysema present. - IPF-Pro registre -Low-dose nintedanib since Oct 2023 due to diarrhea  Combined pulmonary fibrosis and emphysema - with fibrosis much biggter component and epysema only mild -  due to fev/72fc fation > 70 at all time  WHO group 3 pulmonary hypertension diagnosed 06/16/2022 - : Mean PAP 26, PCWP 7, PVR 3.1 -> tuyvaso nebs 07/12/22 -> changed to DPI at 40 mcg dose in July 2024    Saw counselor r September 12, 2022 for adjustment disorder with anxious mood.  Therapeutic drug monitoring  - Nintedanib/Ofev  requires intensive drug monitoring due to high concerns for Adverse effects of , including  Drug Induced Liver Injury, significant GI side effects that include but not limited to Diarrhea, Nausea, Vomiting,  and other system side effects that include Fatigue,  weight loss. Cardiac side effects are a black box warning as well. These will be monitored with  blood work such as LFT initially once a month for 6 months and then quarterly   - tyvaso  dPI -   01/11/2024 -   Chief Complaint  Patient presents with   Interstitial Lung Disease    Pt states since LOV breathing is about the same  SOB occurs when when going up stairs Prod cough ( clear phlegm)      HPI DARRIOUS YOUMAN 68 y.o. -returns for a quick follow-up after completing workup he was getting more dyspneic and also having excess hypoxemia.  Therefore we did differentiate if this was pulmonary fibrosis getting worse or he was getting refractory pulmonary hypertension.  His high-resolution CT chest that showed that his pulmonary fibrosis getting worse.  Both echocardiogram and BNP are normal thus lowering the odds that his pulmonary hypertension is getting worse.  Of note that he is on low-dose nintedanib protocol and is also on Tyvaso  DPI for WHO group 3 pulmonary hypertension.  However his labs showed persistent polycythemia which has had for 1 year with a hemoglobin of 17 g%.  In terms of his care plan - I  referred him to [facility] lung transplant unit.  He spoke to Engineering geologist.  It appears that his wife is not fully on board giving him 10 support in the transplant phase.  She has a lot of commitments.  His assistant was offered to help.  He also listened about the risks and benefits about lung transplant.  He says he is not fully invested in it right now and wants to hold off.  I supported him in this decision making.   -New polycythemia: Hemoglobin is high.  I suspect this is from hypoxemia but will refer him to hematology  -Pulmonary hypertension: This appears to be stable therefore we will continue to watch him with Tyvaso  DPI.  Early next year we could consider clinical trial  -IPF: This is getting worse.  He is on low-dose nintedanib.  He therefore qualifies for newly approved Nerandomilast.  However the paperwork for this has not been set up.  This  because it was only approved 3 days ago.  I have therefore messaged our in-house pharmacy team to work on this over the next several weeks.  I explained to him about the side effects particularly GI including diarrhea abdominal pain.  He he wants to consider this is an add-on therapy  -Symptoms: Symptom score in excess approximately tests are all documented below.  - Ofev  monitoring: LFT normal Sept 2025. Next check 3 months     SYMPTOM SCALE - ILD 8/28/202 0 140# 07/15/2019  01/14/2020 138# 12/22/2020 138# 01/03/2022 142# 03/21/2022 144# low dose ofev  since oct- nov 2023 05/15/2022 Low dose ofev  06/26/22 08/08/2022 Low dose ofev  + tyvaso  since 07/12/22 11/14/2022 Low dose ofev  + tyvasos dpi 07/23/2023 Low doseofe + tyvaso  DPI 01/11/2024 Low-dose nintedanib plus TYVASO    O2 use Room air   ra ra ra ra ra ra ra Room air   Shortness of Breath 0 -> 5 scale with 5 being worst (score 6 If unable to do)             At rest 0 0 0 0 0 0 0 0 0 0  0 0  Simple tasks - showers, clothes change, eating, shaving 0 0 0 0 0 0 0 0 00 0  0 0  Household (dishes,  doing bed, laundry) 0 0  0 0  0 0 0 0 0 0  Shopping 0 0 0 0 0 00 0 0 0 0  0 0  Walkin sel pac 0 0 00 0 0 1 1 0 1 0  0 1  Walking up Stairs 1 1 1 1 1 2 2 2 2 1 2 2   Total (40 - 48) Dyspnea Score 1 1 1 1 1 3 3 3 3 1 2 3   How bad is your cough? 0 0 1 0 0 0 1 1 2 0  0 1  How bad is your fatigue 0 1 1 1 1 1 1 0 1 1  1 1  nausea  1 1 0 0 0 0 0 0 0 0  0  vomit  0 0 0 0 0 0 0 0 00 0  0  diarhea  1 2 1 2 1 1 1 1  0 00 1  anxity  1 1 1 1 1 1 1 2  00 0 1  depresion  1 1 0 1 1 1  x 2 0 0 1          SIT STAND TEST - goal 15 times   07/23/2023  12/06/2023  01/11/2024   O2 used ra ra Room air  PRobe - finter or forehead ?  Finger  Number sit and stand completed - goal 15 15 15 15  times  Time taken to complete 48 sec 50 sec 46 seconds  Resting Pulse Ox/HR/Dyspnea  96% and 81/min and dyspnea of 1/10  93% and HR 81 97% with a heart rate of 91 and score of 0  Peak measures 93 % and 102/min and dyspnea of 2/10 89% and HR 103 94% with a heart rate of 103 and score _0  Final Pulse Ox/HR 94% and 77/min and dyspnea of x/10 97% and HR 90 92% with a heart rate of 89 and a score of 1  Desaturated </= 88% no almsot No  Desaturated <= 3% points yes yes Yes  Got Tachycardic >/= 90/min yes yes Yes  Miscellaneous comments x Dyspnea score 3 worset Similar to April and better than September    CT Chest data from dat8/24/25  -  personally visualized and independently interpreted : yes - my findings are: as below   IMPRESSION: 1. Pulmonary parenchymal pattern of interstitial lung disease, as described above, minimally progressive from 03/03/2022. Findings are consistent with UIP per consensus guidelines: Diagnosis of Idiopathic Pulmonary Fibrosis: An Official ATS/ERS/JRS/ALAT Clinical Practice Guideline. Am JINNY Honey Crit Care Med Vol 198, Iss 5, [id number], Dec 02 2016. 2.  Aortic atherosclerosis (ICD10-I70.0). 3.  Emphysema (ICD10-J43.9).     Electronically Signed   By: Newell Eke M.D.   On: 12/27/2023  14:53 PFT     Latest Ref Rng & Units 12/06/2023   10:48 AM 07/23/2023    7:53 AM 03/15/2023    2:41 PM 11/14/2022   11:13 AM 06/13/2022   10:25 AM 05/15/2022    9:10 AM 12/27/2021    9:48 AM  PFT Results  FVC-Pre L 3.42  3.65  3.57  3.74  3.77  3.69  3.83   FVC-Predicted Pre % 85  91  88  92  95  93  97   Pre FEV1/FVC % % 84  86  85  84  85  78  87   FEV1-Pre L 2.87  3.15  3.03  3.14  3.20  2.86  3.35   FEV1-Predicted Pre % 97  106  101  105  109  97  115   DLCO uncorrected ml/min/mmHg 8.55  12.02  10.56  11.81  11.57  12.96  12.42   DLCO UNC% % 35  50  43  49  48  54  52   DLCO corrected ml/min/mmHg 8.55  12.02  10.56  11.81  11.57  12.96  12.42   DLCO COR %Predicted % 35  50  43  49  48  54  52   DLVA Predicted % 53  63  52  61  60  57  61   TLC L     5.44     TLC % Predicted %     86     RV % Predicted %     94        ECHO 01/02/24    IMPRESSIONS     1. Left ventricular ejection fraction, by estimation, is 60 to 65%. Left  ventricular ejection fraction by 3D volume is 64 %. The left ventricle has  normal function. The left ventricle has no regional wall motion  abnormalities. Left ventricular diastolic   parameters are consistent with Grade I diastolic dysfunction (impaired  relaxation).   2. Right ventricular systolic function is normal. The right ventricular  size is normal.   3. The mitral valve is normal in structure. No evidence of mitral valve  regurgitation. No evidence of mitral stenosis.   4. The aortic valve is normal in structure. Aortic valve regurgitation is  not visualized. No aortic stenosis is present.   5. The inferior vena cava is normal in size with greater than 50%  respiratory variability, suggesting right atrial pressure of 3 mmHg   LAB RESULTS last 96 hours No results found.       has a past medical history of Dyspnea, Family history of colon cancer, Family history of prostate cancer, Family history of pulmonary fibrosis, Hyperlipemia, and  Pneumonia.   reports that he has never smoked. He has never used smokeless tobacco.  Past Surgical History:  Procedure Laterality Date   COLONOSCOPY WITH PROPOFOL  N/A 09/21/2014   Procedure: COLONOSCOPY WITH PROPOFOL ;  Surgeon: Gladis MARLA Louder, MD;  Location:  [facility];  Service: Endoscopy;  Laterality: N/A;   FOREARM SURGERY     excision of birth markchild   HERNIA REPAIR     RIH-open   KNEE ARTHROTOMY Left    bursa excision   LUNG BIOPSY Right 02/02/2016   Procedure: LUNG BIOPSY;  Surgeon: Elspeth JAYSON Millers, MD;  Location[facility] OR;  Service: Thoracic;  Laterality: Right;   NASAL SEPTUM SURGERY     RIGHT HEART CATH N/A 06/16/2022   Procedure: RIGHT HEART CATH;  Surgeon: Rolan Ezra RAMAN, MD;  Location: [facility] INVASIVE CV LAB;  Service: Cardiovascular;  Laterality: N/A;   VIDEO ASSISTED THORACOSCOPY Right 02/02/2016   Procedure: VIDEO ASSISTED THORACOSCOPY;  Surgeon: Elspeth JAYSON Millers, MD;  Location: [facility] OR;  Service: Thoracic;  Laterality: Right;   WRIST GANGLION EXCISION Left     Allergies  Allergen Reactions   Penicillins Other (See Comments)    UNSPECIFIED REACTION OF CHILDHOOD Has patient had a PCN reaction causing immediate rash, facial/tongue/throat swelling, SOB or lightheadedness with hypotension:unsure Has patient had a PCN reaction causing severe rash involving mucus membranes or skin necrosis:unsure Has patient had a PCN reaction that required hospitalization:No Has patient had a PCN reaction occurring within the last 10 years:No If all of the above answers are NO, then may proceed with Cephalosporin use.     Immunization History  Administered Date(s) Administered   Fluad Quad(high Dose 65+) 12/02/2020, 01/03/2022   Fluzone Influenza virus vaccine,trivalent (IIV3), split virus 01/13/2011, 01/13/2013, 12/01/2013, 12/04/2014, 11/13/2015, 12/09/2016   INFLUENZA, HIGH DOSE SEASONAL PF 12/12/2023   Influenza Split 01/02/2012   Influenza,inj,Quad PF,6+ Mos  12/09/2016, 11/26/2017, 11/29/2018, 12/31/2019   Influenza-Unspecified 11/02/2015, 12/09/2016, 12/03/2022   Moderna SARS-COV2 Booster Vaccination 01/31/2020   Moderna Sars-Covid-2 Vaccination 06/05/2019, 07/08/2019, 01/31/2020, 07/05/2020   Pneumococcal Conjugate-13 02/26/2018   Pneumococcal Polysaccharide-23 02/05/2016   Respiratory Syncytial Virus Vaccine,Recomb Aduvanted(Arexvy) 01/10/2022   Td 07/12/2017   Td (Adult) 08/01/1997   Tdap 12/10/2007   Zoster Recombinant(Shingrix) 12/09/2016, 02/10/2017   Zoster, Live 05/05/2015, 11/02/2015, 12/09/2016, 02/10/2017    Family History  Problem Relation Age of Onset   Lung cancer Mother    Colon cancer Mother 14   Pulmonary fibrosis Father        dx in his 36s   Prostate cancer Brother 20   COPD Paternal Grandfather    Other Paternal Uncle        possible lung dx; died around age 94   Other Cousin        possible lung dx, died in her 13s; paternal first cousin   Healthy Son      Current Outpatient Medications:    loratadine (CLARITIN) 10 MG tablet, Take 10 mg by mouth daily., Disp: , Rfl:    Multiple Vitamin (MULTIVITAMIN WITH MINERALS) TABS tablet, Take 1 tablet by mouth daily., Disp: , Rfl:    OFEV  100 MG CAPS, TAKE 1 CAPSULE BY MOUTH 2 TIMES A DAY, Disp: 180 capsule, Rfl: 1   pseudoephedrine (SUDAFED) 120 MG 12 hr tablet, Take 120 mg by mouth every 12 (twelve) hours as needed for congestion., Disp: , Rfl:    TYVASO  DPI MAINTENANCE KIT 48 MCG POWD, Inhale 48 mcg into the lungs in the morning, at noon, in the evening, and at bedtime., Disp: 112 each, Rfl: 5   simvastatin  (ZOCOR ) 10 MG tablet, Take 10 mg by mouth daily at 6 PM.  (Patient not taking: Reported on 01/11/2024), Disp: , Rfl: 0      Objective:  Vitals:   01/11/24 1026  BP: 116/64  Pulse: 86  Temp: 98.7 F (37.1 C)  TempSrc: Oral  SpO2: 97%  Weight: 143 lb 6.4 oz (65 kg)  Height: 5' 7 (1.702 m)    Estimated body mass index is 22.46 kg/m as calculated from  the following:   Height as of this encounter: 5' 7 (1.702 m).   Weight as of this encounter: 143 lb 6.4 oz (65 kg).  @WEIGHTCHANGE @  Filed Weights   01/11/24 1026  Weight: 143 lb 6.4 oz (65 kg)     Physical Exam   General: No distress. Looks well O2 at rest: no Cane present: no Sitting in wheel chair: no Frail: no Obese: no Neuro: Alert and Oriented x 3. GCS 15. Speech normal Psych: Pleasant Resp:  Barrel Chest - no.  Wheeze - no, Crackles - yes base, No overt respiratory distress CVS: Normal heart sounds. Murmurs - no Ext: Stigmata of Connective Tissue Disease - no HEENT: Normal upper airway. PEERL +. No post nasal drip        Assessment/     Assessment & Plan IPF (idiopathic pulmonary fibrosis) (HCC)  Familial idiopathic pulmonary fibrosis (HCC)  Encounter for therapeutic drug monitoring  Pulmonary emphysema (HCC)  WHO group 3 pulmonary arterial hypertension (HCC)  Polycythemia    PLAN Patient Instructions  Exerrcise hypoxemia onset 12/06/2023  - stable. Due to IPF getting worse   Plan - cotninue 4L [place] portable oxygen; keep > 88%     IPF (idiopathic pulmonary fibrosis) (HCC) Familial idiopathic pulmonary fibrosis (HCC) Therapeutic drug monitoring   - Concern for progression based on CT; currently on low dose nintedanib  - Noted you want to hold off transplant evaluation due to high family demand  Plan  -Continue ofev  low-dose protocol at 100 mg twice daily -Consider adding on  Nerandomilast    - sending message to pharmacy team    Pulmonary emphysema, unspecified emphysema type (HCC)  -You to have associated emphysema but this is the much minor component because fev1/fvc ratio always > 70 - curently stable  Plan - Continue Spiriva  Respimat samples 2.5 mcg dose and take it 2 puffs once daily  WHO-3 pulmonary hypertension - new diagnosis 06/16/22; started tyvaso  07/12/22  Tolerating tyvaso  DPI 48 stregth -> BNP Nromal Sept 2025/Echo  normal Pct 2025  Plan  - - continue tyvaso  dpi as before    Polycythemia  -  high 17gm% since march 2024 -> Sept 2025  Plan  -refer hematology  plan   - Followup - 12 weeks Melissa Tomaselli after spiro/dlco for 30 min visit    FOLLOWUP    Return for 12 weeks Declan Mier after spiro/dlco for 30 min visit.    SIGNATURE    Dr. Dorethia Cave, M.D., F.C.C.P,  Pulmonary and Critical Care Medicine Staff Physician, [facility] Center Director - Interstitial Lung Disease  Program  Pulmonary Fibrosis [facility] at [facility] [place], [place], [address]  Pager: [phone], If no answer or between  15:00h - 7:00h: call [phone] Telephone: [phone]  4:25 PM 01/11/2024

## 2024-01-11 NOTE — Telephone Encounter (Signed)
 Pharmacy team will meet with BI on 01/14/24 to ensure we have a process in place for obtaining the medication.   Will update in separate thread with benefits investigation once initiated.   Aleck Puls, PharmD, BCPS, CPP Clinical Pharmacist  [facility]

## 2024-01-11 NOTE — Telephone Encounter (Signed)
 Shaun [name] has advanced IPF.  He is progressing despite nintedanib.  He is not able to tolerate higher dose nintedanib.  Please consider him for Nerandomilast.  Again no rush.  Sometime in the next 4-6 weeks is fine as soon as to get paperwork organized      SIGNATURE    Dr. Dorethia Cave, M.D., F.C.C.P,  Pulmonary and Critical Care Medicine Staff Physician, [facility] Center Director - Interstitial Lung Disease  Program  Pulmonary Fibrosis [facility] at [facility] [place], [place], [address]   Pager: [phone], If no answer  -> Check AMION or Try [phone] Telephone (clinical office): [phone] Telephone (research): [phone]  11:08 AM 01/11/2024

## 2024-01-11 NOTE — Patient Instructions (Addendum)
 Exerrcise hypoxemia onset 12/06/2023  - stable. Due to IPF getting worse   Plan - cotninue 4L [place] portable oxygen; keep > 88%     IPF (idiopathic pulmonary fibrosis) (HCC) Familial idiopathic pulmonary fibrosis (HCC) Therapeutic drug monitoring   - Concern for progression based on CT; currently on low dose nintedanib  - Noted you want to hold off transplant evaluation due to high family demand  Plan  -Continue ofev  low-dose protocol at 100 mg twice daily -Consider adding on  Nerandomilast    - sending message to pharmacy team    Pulmonary emphysema, unspecified emphysema type (HCC)  -You to have associated emphysema but this is the much minor component because fev1/fvc ratio always > 70 - curently stable  Plan - Continue Spiriva  Respimat samples 2.5 mcg dose and take it 2 puffs once daily  WHO-3 pulmonary hypertension - new diagnosis 06/16/22; started tyvaso  07/12/22  Tolerating tyvaso  DPI 48 stregth -> BNP Nromal Sept 2025/Echo normal Pct 2025  Plan  - - continue tyvaso  dpi as before    Polycythemia  -  high 17gm% since march 2024 -> Sept 2025  Plan  -refer hematology  plan   - Followup - 12 weeks Atlas Kuc after spiro/dlco for 30 min visit

## 2024-01-22 ENCOUNTER — Telehealth: Payer: Self-pay

## 2024-01-22 DIAGNOSIS — J84112 Idiopathic pulmonary fibrosis: Secondary | ICD-10-CM

## 2024-01-22 DIAGNOSIS — Z5181 Encounter for therapeutic drug level monitoring: Secondary | ICD-10-CM

## 2024-01-22 NOTE — Telephone Encounter (Signed)
 Received notification via telephone encounter that pt is Shaun [name] new start. Submitted a Prior Authorization request to [facility] for Shaun [name] via CoverMyMeds. Will update once we receive a response.  Key: [id number]

## 2024-01-23 NOTE — Telephone Encounter (Signed)
 Received a fax regarding Prior Authorization from [facility] for JASCAYD. Authorization has been DENIED because pt must try and fail pirfenidone or send medical records showing why they can't take pirfenidone.  Phone# [phone]

## 2024-01-28 NOTE — Telephone Encounter (Signed)
 He Is on ofev  100mg  bid. He was not able to tolerate going up on that. Cannot remember if he has tried esbreit in past. You can offer him or review my notes for prior conversation. He and I disucssed JASCAYD as add on. Esbriet as add on will cause side effects. So it will be a replacement    Allergies  Allergen Reactions   Penicillins Other (See Comments)    UNSPECIFIED REACTION OF CHILDHOOD Has patient had a PCN reaction causing immediate rash, facial/tongue/throat swelling, SOB or lightheadedness with hypotension:unsure Has patient had a PCN reaction causing severe rash involving mucus membranes or skin necrosis:unsure Has patient had a PCN reaction that required hospitalization:No Has patient had a PCN reaction occurring within the last 10 years:No If all of the above answers are NO, then may proceed with Cephalosporin use.

## 2024-01-29 ENCOUNTER — Telehealth: Payer: Self-pay

## 2024-01-29 NOTE — Telephone Encounter (Signed)
 Order has been placed.

## 2024-01-29 NOTE — Telephone Encounter (Signed)
 See result of Jascayd benefits investigation -   Opening new thread for pirfenidone benefits investigation.

## 2024-01-30 ENCOUNTER — Ambulatory Visit: Attending: Internal Medicine

## 2024-01-30 ENCOUNTER — Other Ambulatory Visit (HOSPITAL_COMMUNITY): Payer: Self-pay

## 2024-01-30 DIAGNOSIS — J84112 Idiopathic pulmonary fibrosis: Secondary | ICD-10-CM

## 2024-01-30 DIAGNOSIS — Z5181 Encounter for therapeutic drug level monitoring: Secondary | ICD-10-CM

## 2024-01-30 MED ORDER — PIRFENIDONE 267 MG PO TABS
ORAL_TABLET | ORAL | 0 refills | Status: DC
Start: 2024-01-30 — End: 2024-02-21
  Filled 2024-01-31: qty 207, 30d supply, fill #0

## 2024-01-30 MED ORDER — PIRFENIDONE 267 MG PO TABS
ORAL_TABLET | ORAL | 1 refills | Status: DC
  Filled 2024-01-31: qty 270, fill #0
  Filled 2024-02-27: qty 270, 30d supply, fill #0
  Filled 2024-03-26: qty 270, 30d supply, fill #1

## 2024-01-30 NOTE — Telephone Encounter (Signed)
 Submitted a Prior Authorization request to [facility] for PIRFENIDONE via CoverMyMeds. Will update once we receive a response.  Key: [id number]

## 2024-01-30 NOTE — Telephone Encounter (Signed)
 Received notification from [facility] regarding a prior authorization for PIRFENIDONE. Authorization has been APPROVED from 01/16/24 to 04/02/98. Approval letter sent to scan center.  Per test claim, copay for 30 days supply is $0  Patient can fill through [facility]: [phone]   Authorization # [id number] Phone # [phone]

## 2024-01-30 NOTE — Progress Notes (Signed)
 Subjective:  Patient called today by [facility] team for Esbriet new start.  Referred by pulmonologist, Dr. Geronimo. Patient was last seen by Dr. Geronimo on 01/11/24. He has IPF. CURRENTLY taking low-dose Ofev  protocol.   At time of last OV with Dr. Geronimo on 01/11/24, concern for progression based on CT. Plan to continue Ofev  low-dose protocol and add nerandomilast Myrtie). See [place] benefits investigation thread 01/22/24 - insurance will not approve Jascayd unless patient has tried pirfenidone. Per discussion with Dr. Geronimo, can start Esbriet as a replacement for Ofev .   Contacting patient to discuss Esbriet new start education.  Patient reports he is currently tolerating Ofev  well overall. He is open to trying Esbriet based on our conversation. He will follow-up with Dr. Geronimo in January.   Objective: Allergies  Allergen Reactions   Penicillins Other (See Comments)    UNSPECIFIED REACTION OF CHILDHOOD Has patient had a PCN reaction causing immediate rash, facial/tongue/throat swelling, SOB or lightheadedness with hypotension:unsure Has patient had a PCN reaction causing severe rash involving mucus membranes or skin necrosis:unsure Has patient had a PCN reaction that required hospitalization:No Has patient had a PCN reaction occurring within the last 10 years:No If all of the above answers are NO, then may proceed with Cephalosporin use.     Outpatient Encounter Medications as of 01/30/2024  Medication Sig   loratadine (CLARITIN) 10 MG tablet Take 10 mg by mouth daily.   Multiple Vitamin (MULTIVITAMIN WITH MINERALS) TABS tablet Take 1 tablet by mouth daily.   OFEV  100 MG CAPS TAKE 1 CAPSULE BY MOUTH 2 TIMES A DAY   pseudoephedrine (SUDAFED) 120 MG 12 hr tablet Take 120 mg by mouth every 12 (twelve) hours as needed for congestion.   simvastatin  (ZOCOR ) 10 MG tablet Take 10 mg by mouth daily at 6 PM.  (Patient not taking: Reported on  01/11/2024)   TYVASO  DPI MAINTENANCE KIT 48 MCG POWD Inhale 48 mcg into the lungs in the morning, at noon, in the evening, and at bedtime.   No facility-administered encounter medications on file as of 01/30/2024.     Immunization History  Administered Date(s) Administered   Fluad Quad(high Dose 65+) 12/02/2020, 01/03/2022   Fluzone Influenza virus vaccine,trivalent (IIV3), split virus 01/13/2011, 01/13/2013, 12/01/2013, 12/04/2014, 11/13/2015, 12/09/2016   INFLUENZA, HIGH DOSE SEASONAL PF 12/12/2023   Influenza Split 01/02/2012   Influenza,inj,Quad PF,6+ Mos 12/09/2016, 11/26/2017, 11/29/2018, 12/31/2019   Influenza-Unspecified 11/02/2015, 12/09/2016, 12/03/2022   Moderna SARS-COV2 Booster Vaccination 01/31/2020   Moderna Sars-Covid-2 Vaccination 06/05/2019, 07/08/2019, 01/31/2020, 07/05/2020   Pneumococcal Conjugate-13 02/26/2018   Pneumococcal Polysaccharide-23 02/05/2016   Respiratory Syncytial Virus Vaccine,Recomb Aduvanted(Arexvy) 01/10/2022   Td 07/12/2017   Td (Adult) 08/01/1997   Tdap 12/10/2007   Zoster Recombinant(Shingrix) 12/09/2016, 02/10/2017   Zoster, Live 05/05/2015, 11/02/2015, 12/09/2016, 02/10/2017      PFT's TLC  Date Value Ref Range Status  06/13/2022 5.44 L Final      CMP     Component Value Date/Time   NA 137 12/06/2023 1353   K 4.0 12/06/2023 1353   CL 102 12/06/2023 1353   CO2 26 12/06/2023 1353   GLUCOSE 113 (H) 12/06/2023 1353   BUN 14 12/06/2023 1353   CREATININE 0.88 12/06/2023 1353   CALCIUM 9.1 12/06/2023 1353   PROT 6.7 12/06/2023 1353   ALBUMIN 4.1 12/06/2023 1353   AST 22 12/06/2023 1353   ALT 13 12/06/2023 1353   ALKPHOS 66 12/06/2023 1353   BILITOT 0.4 12/06/2023 1353  GFRNONAA >60 06/16/2022 1014   GFRAA >60 02/04/2016 0410      CBC    Component Value Date/Time   WBC 10.7 (H) 12/06/2023 1353   RBC 5.33 12/06/2023 1353   HGB 17.5 (H) 12/06/2023 1353   HCT 52.3 (H) 12/06/2023 1353   PLT 270.0 12/06/2023 1353   MCV  98.2 12/06/2023 1353   MCH 32.9 06/16/2022 1014   MCHC 33.4 12/06/2023 1353   RDW 13.5 12/06/2023 1353   LYMPHSABS 1.8 12/06/2023 1353   MONOABS 0.8 12/06/2023 1353   EOSABS 0.2 12/06/2023 1353   BASOSABS 0.0 12/06/2023 1353      LFT's    Latest Ref Rng & Units 12/06/2023    1:53 PM 07/23/2023   11:38 AM 03/15/2023    4:23 PM  Hepatic Function  Total Protein 6.0 - 8.3 g/dL 6.7  6.8  6.9   Albumin 3.5 - 5.2 g/dL 4.1  4.2  4.2   AST 0 - 37 U/L 22  24  28    ALT 0 - 53 U/L 13  14  17    Alk Phosphatase 39 - 117 U/L 66  62  64   Total Bilirubin 0.2 - 1.2 mg/dL 0.4  0.5  0.4   Bilirubin, Direct 0.0 - 0.3 mg/dL 0.1  0.1  0.1       HRCT (12/26/23) IMPRESSION: 1. Pulmonary parenchymal pattern of interstitial lung disease, as described above, minimally progressive from 03/03/2022. Findings are consistent with UIP per consensus guidelines: Diagnosis of Idiopathic Pulmonary Fibrosis: An Official ATS/ERS/JRS/ALAT Clinical Practice Guideline. Am JINNY Honey Crit Care Med Vol 198, Iss 5, [phone], Dec 02 2016.  Assessment and Plan  Esbriet Medication Management Thoroughly counseled patient on the efficacy, mechanism of action, dosing, administration, adverse effects, and monitoring parameters of Esbriet.  Patient verbalized understanding.   Emphasized that Esbriet would REPLACE Ofev . Do NOT take Esbriet at the same time as Ofev  due to side effects. At follow-up with Dr. Geronimo, can revisit Jascayd.   Goals of Therapy: Will not stop or reverse the progression of ILD. It will slow the progression of ILD.   Dosing: Starting dose will be Esbriet 267 mg 1 tablet three times daily for 7 days, then 2 tablets three times daily for 7 days, then 3 tablets three times daily.  Maintenance dose will be 801 mg 1 tablet three times daily if tolerated.  Stressed the importance of taking with meals and space at least 5-6 hours apart to minimize stomach upset.   Adverse Effects: Nausea, vomiting,  diarrhea, weight loss Abdominal pain GERD Sun sensitivity/rash - patient advised to wear sunscreen when exposed to sunlight Dizziness Fatigue  Monitoring: Monitor for diarrhea, nausea and vomiting, GI perforation, hepatotoxicity  Monitor LFTs - baseline, monthly for first 6 months, then every 3 months routinely  Access: Approval of Esbriet through: insurance Rx sent to: [facility]: [phone]   Medication Reconciliation A drug regimen assessment was performed, including review of allergies, interactions, disease-state management, dosing and immunization history. Medications were reviewed with the patient, including name, instructions, indication, goals of therapy, potential side effects, importance of adherence, and safe use.  Patient is willing to switch to Esbriet. We reviewed that if he does not tolerate Esbriet, he can switch back to Ofev  or discuss alternatives with Dr. Geronimo. Emphasized importance of communication if new concerns or issues arise.   PLAN:  - Stop Ofev , and START Esbriet 267mg  tablet: Take 1 tab three times daily for 7 days,  then 2 tabs three times daily for 7 days, then 3 tabs three times daily thereafter. - Do not take Ofev  and Esbriet at the same time. Esbriet replaces Ofev .  - Rx triaged to [facility]. He will be receiving call from [facility] for onboarding.  - Repeat LFTs in 1 month after you start taking Esbriet. LFTs will be rechecked monthly for the first 6 months of therapy, then every 3 months thereafter.  - Follow-up with Dr. Geronimo as planned on 04/18/24.  - Provided pharmacy team contact information in case he has issues with side effects or new concerns arise.   This appointment required 30 minutes of patient care (this includes precharting, chart review, review of results, virtual care, etc.).  Thank you for involving pharmacy to assist in providing this patient's care.   Aleck Puls, PharmD, BCPS,  CPP Clinical Pharmacist  [facility]

## 2024-01-30 NOTE — Telephone Encounter (Signed)
 New start pirfenidone counseling completed in pharmacotherapy clinic - see clinical note 01/30/24. Patient is aware he will receive call from [facility] for onboarding.

## 2024-01-31 ENCOUNTER — Other Ambulatory Visit (HOSPITAL_COMMUNITY): Payer: Self-pay

## 2024-01-31 ENCOUNTER — Other Ambulatory Visit: Payer: Self-pay

## 2024-01-31 NOTE — Progress Notes (Unsigned)
 Specialty Pharmacy Initial Fill Coordination Note  Shaun [name] is a 67 y.o. male contacted today regarding initial fill of specialty medication(s) Pirfenidone   Patient requested Delivery   Delivery date: 02/05/24   Verified address: [address] [place], [place] [address]   Medication will be filled on: 02/04/24   Patient is aware of $0 copayment.    **Pt requested to not bill his medication until Monday 11/3**

## 2024-01-31 NOTE — Progress Notes (Signed)
 Patient requested call today - spoke to patient.   He just received a 30 day supply of Ofev  in the mail.  He is concerned about switching to a new medication (Esbriet) just before Thanksgiving.   We reviewed that he can finish remaining supply of Ofev , then start Esbriet. Alternatively, we discussed that if he chooses to start Esbriet now, he will stop Ofev  and can start Esbriet the next day. We can adjust regimen for side effects if needed ahead of when he would be traveling for Thanksgiving. Encouraged him to call with any questions or concerns.   He verbalizes understanding. He plans to switch to Esbriet, but would like to think about the timing - whether he will switch now or wait until after Thanksgiving. He will call if any concerns.

## 2024-02-01 NOTE — Progress Notes (Signed)
 Mr. Hollingsworth was educated in detail on pirfenidone on 01/30/24.   PLAN:  - Stop Ofev , and START Esbriet 267mg  tablet: Take 1 tab three times daily for 7 days, then 2 tabs three times daily for 7 days, then 3 tabs three times daily thereafter. - Do not take Ofev  and Esbriet at the same time. Esbriet replaces Ofev .  - Rx triaged to [facility]. He will be receiving call from [facility] for onboarding.  - Repeat LFTs in 1 month after you start taking Esbriet. LFTs will be rechecked monthly for the first 6 months of therapy, then every 3 months thereafter.  - Follow-up with Dr. Geronimo as planned on 04/18/24.  - Provided pharmacy team contact information in case he has issues with side effects or new concerns arise.   Aleck Puls, PharmD, BCPS, CPP Clinical Pharmacist  [facility]

## 2024-02-04 ENCOUNTER — Other Ambulatory Visit: Payer: Self-pay

## 2024-02-08 ENCOUNTER — Other Ambulatory Visit: Payer: Self-pay | Admitting: Internal Medicine

## 2024-02-08 DIAGNOSIS — J84112 Idiopathic pulmonary fibrosis: Secondary | ICD-10-CM

## 2024-02-15 NOTE — Telephone Encounter (Signed)
 Received fax from [facility] that Tyvaso  DPI 48 mcg kit was shipped on 02/11/24.

## 2024-02-20 NOTE — Progress Notes (Unsigned)
 [facility] CONSULT NOTE  Patient Care Team: Loreli Kins, MD as PCP - General (Family Medicine) Hunsucker, Donnice SAUNDERS, MD as Consulting Physician (Pulmonary Disease)  ASSESSMENT & PLAN:  Shaun [name] is a 68 y.o.male with history of IPF, pulmonary hypertension being seen at [facility] for polycythemia.  Discussed differential diagnosis today.  Patient has underlying pulmonary illness with pulmonary hypertension and IPF.  We will rule out other potential causes.  If no concerning findings, patient may follow-up as needed.  Assessment & Plan Polycythemia Obtain labs and urine study If normal, may follow-up as needed  Orders Placed This Encounter  Procedures   CBC with Differential (Cancer Center Only)    Standing Status:   Future    Number of Occurrences:   1    Expiration Date:   02/20/2025   JAK2 V617F, reflex to E12-15    Standing Status:   Future    Number of Occurrences:   1    Expiration Date:   02/20/2025   Erythropoietin    Standing Status:   Future    Number of Occurrences:   1    Expiration Date:   02/20/2025   Urinalysis, Complete w Microscopic    Standing Status:   Future    Number of Occurrences:   1    Expiration Date:   02/20/2025    [name], MD 11/20/20253:56 PM  CHIEF COMPLAINTS/PURPOSE OF CONSULTATION:  Polycythemia  HISTORY OF PRESENTING ILLNESS:  Shaun [name] 68 y.o. male is here because of polycythemia  Report he was diagnosed of IPF about 2017.  This gradual decline over the last few years.  He gets short of breath on exertion but not at rest.  He has not required any oxygen yet.  Report there may have been some decline over the past few years.  He denies history of sleep apnea, smoking or vaping.  He does not use testosterone.  He denies any erythromelalgia, pruritus after hot bath, history of gout, abdominal pain, decreased appetite, unexpected weight loss, blood in the urine or other symptoms.  He is otherwise  feeling well.   MEDICAL HISTORY:  Past Medical History:  Diagnosis Date   Dyspnea    Family history of colon cancer    Family history of prostate cancer    Family history of pulmonary fibrosis    Hyperlipemia    Pneumonia     SURGICAL HISTORY: Past Surgical History:  Procedure Laterality Date   COLONOSCOPY WITH PROPOFOL  N/A 09/21/2014   Procedure: COLONOSCOPY WITH PROPOFOL ;  Surgeon: Gladis MARLA Louder, MD;  Location: [facility];  Service: Endoscopy;  Laterality: N/A;   FOREARM SURGERY     excision of birth markchild   HERNIA REPAIR     RIH-open   KNEE ARTHROTOMY Left    bursa excision   LUNG BIOPSY Right 02/02/2016   Procedure: LUNG BIOPSY;  Surgeon: Elspeth JAYSON Millers, MD;  Location: [facility] OR;  Service: Thoracic;  Laterality: Right;   NASAL SEPTUM SURGERY     RIGHT HEART CATH N/A 06/16/2022   Procedure: RIGHT HEART CATH;  Surgeon: Rolan Ezra RAMAN, MD;  Location: [facility] INVASIVE CV LAB;  Service: Cardiovascular;  Laterality: N/A;   VIDEO ASSISTED THORACOSCOPY Right 02/02/2016   Procedure: VIDEO ASSISTED THORACOSCOPY;  Surgeon: Elspeth JAYSON Millers, MD;  Location: [facility] OR;  Service: Thoracic;  Laterality: Right;   WRIST GANGLION EXCISION Left     SOCIAL HISTORY: Social History   Socioeconomic History   Marital  status: Married    Spouse name: Not on file   Number of children: Not on file   Years of education: Not on file   Highest education level: Not on file  Occupational History   Not on file  Tobacco Use   Smoking status: Never   Smokeless tobacco: Never  Substance and Sexual Activity   Alcohol use: No    Alcohol/week: 0.0 standard drinks of alcohol   Drug use: No   Sexual activity: Not on file  Other Topics Concern   Not on file  Social History Narrative   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    Social Drivers of Corporate Investment Banker Strain: Not on file  Food Insecurity: No Food Insecurity (02/21/2024)   Hunger Vital Sign    Worried About Running  Out of Food in the Last Year: Never true    Ran Out of Food in the Last Year: Never true  Transportation Needs: No Transportation Needs (02/21/2024)   PRAPARE - Administrator, Civil Service (Medical): No    Lack of Transportation (Non-Medical): No  Physical Activity: Not on file  Stress: Not on file  Social Connections: Not on file  Intimate Partner Violence: Not At Risk (02/21/2024)   Humiliation, Afraid, Rape, and Kick questionnaire    Fear of Current or Ex-Partner: No    Emotionally Abused: No    Physically Abused: No    Sexually Abused: No    FAMILY HISTORY: Family History  Problem Relation Age of Onset   Lung cancer Mother    Colon cancer Mother 19   Pulmonary fibrosis Father        dx in his 43s   Prostate cancer Brother 79   COPD Paternal Grandfather    Other Paternal Uncle        possible lung dx; died around age 73   Other Cousin        possible lung dx, died in her 58s; paternal first cousin   Healthy Son     ALLERGIES:  is allergic to penicillins.  MEDICATIONS:  Current Outpatient Medications  Medication Sig Dispense Refill   loratadine (CLARITIN) 10 MG tablet Take 10 mg by mouth daily.     Multiple Vitamin (MULTIVITAMIN WITH MINERALS) TABS tablet Take 1 tablet by mouth daily.     Pirfenidone  267 MG TABS Take 3 tablets (801mg ) by mouth three times daily with food. 270 tablet 1   pseudoephedrine (SUDAFED) 120 MG 12 hr tablet Take 120 mg by mouth every 12 (twelve) hours as needed for congestion.     simvastatin  (ZOCOR ) 10 MG tablet Take 10 mg by mouth daily at 6 PM.   0   TYVASO  DPI MAINTENANCE KIT 48 MCG POWD Inhale 48 mcg into the lungs in the morning, at noon, in the evening, and at bedtime. 112 each 5   No current facility-administered medications for this visit.    REVIEW OF SYSTEMS:   All relevant systems were reviewed with the patient and are negative.  PHYSICAL EXAMINATION:  Vitals:   02/21/24 1448  BP: 136/60  Pulse: 84  Resp: 20   Temp: 97.9 F (36.6 C)  SpO2: 95%   Filed Weights   02/21/24 1448  Weight: 144 lb 9.6 oz (65.6 kg)    GENERAL: alert, no distress and comfortable SKIN: skin color is normal, no jaundice.  No erythromelalgia NECK: supple LYMPH:  no palpable lymphadenopathy in the cervical regions LUNGS: Effort normal,  no respiratory distress.  Inspiratory crackles in the lower bases HEART: regular rate & rhythm and no lower extremity edema ABDOMEN: soft, non-tender and nondistended Musculoskeletal: no edema   LABORATORY DATA:  I have reviewed the data as listed Lab Results  Component Value Date   WBC 10.7 (H) 12/06/2023   HGB 17.5 (H) 12/06/2023   HCT 52.3 (H) 12/06/2023   MCV 98.2 12/06/2023   PLT 270.0 12/06/2023   Recent Labs    03/15/23 1623 07/23/23 1138 12/06/23 1353  NA  --   --  137  K  --   --  4.0  CL  --   --  102  CO2  --   --  26  GLUCOSE  --   --  113*  BUN  --   --  14  CREATININE  --   --  0.88  CALCIUM  --   --  9.1  PROT 6.9 6.8 6.7  ALBUMIN 4.2 4.2 4.1  AST 28 24 22   ALT 17 14 13   ALKPHOS 64 62 66  BILITOT 0.4 0.5 0.4  BILIDIR 0.1 0.1 0.1    RADIOGRAPHIC STUDIES: I have personally reviewed the radiological images as listed and agreed with the findings in the report.

## 2024-02-21 ENCOUNTER — Inpatient Hospital Stay

## 2024-02-21 VITALS — BP 136/60 | HR 84 | Temp 97.9°F | Resp 20 | Wt 144.6 lb

## 2024-02-21 DIAGNOSIS — I272 Pulmonary hypertension, unspecified: Secondary | ICD-10-CM | POA: Insufficient documentation

## 2024-02-21 DIAGNOSIS — Z79899 Other long term (current) drug therapy: Secondary | ICD-10-CM | POA: Insufficient documentation

## 2024-02-21 DIAGNOSIS — D751 Secondary polycythemia: Secondary | ICD-10-CM | POA: Diagnosis not present

## 2024-02-21 LAB — URINALYSIS, COMPLETE (UACMP) WITH MICROSCOPIC
Bacteria, UA: NONE SEEN
Bilirubin Urine: NEGATIVE
Glucose, UA: NEGATIVE mg/dL
Hgb urine dipstick: NEGATIVE
Ketones, ur: NEGATIVE mg/dL
Leukocytes,Ua: NEGATIVE
Nitrite: NEGATIVE
Protein, ur: NEGATIVE mg/dL
Specific Gravity, Urine: 1.023 (ref 1.005–1.030)
pH: 6 (ref 5.0–8.0)

## 2024-02-21 LAB — CBC WITH DIFFERENTIAL (CANCER CENTER ONLY)
Abs Immature Granulocytes: 0.07 K/uL (ref 0.00–0.07)
Basophils Absolute: 0 K/uL (ref 0.0–0.1)
Basophils Relative: 0 %
Eosinophils Absolute: 0.1 K/uL (ref 0.0–0.5)
Eosinophils Relative: 2 %
HCT: 50.3 % (ref 39.0–52.0)
Hemoglobin: 16.9 g/dL (ref 13.0–17.0)
Immature Granulocytes: 1 %
Lymphocytes Relative: 20 %
Lymphs Abs: 2 K/uL (ref 0.7–4.0)
MCH: 32.3 pg (ref 26.0–34.0)
MCHC: 33.6 g/dL (ref 30.0–36.0)
MCV: 96 fL (ref 80.0–100.0)
Monocytes Absolute: 0.8 K/uL (ref 0.1–1.0)
Monocytes Relative: 8 %
Neutro Abs: 6.6 K/uL (ref 1.7–7.7)
Neutrophils Relative %: 69 %
Platelet Count: 271 K/uL (ref 150–400)
RBC: 5.24 MIL/uL (ref 4.22–5.81)
RDW: 13 % (ref 11.5–15.5)
WBC Count: 9.6 K/uL (ref 4.0–10.5)
nRBC: 0 % (ref 0.0–0.2)

## 2024-02-23 LAB — ERYTHROPOIETIN: Erythropoietin: 7.7 m[IU]/mL (ref 2.6–18.5)

## 2024-02-27 ENCOUNTER — Other Ambulatory Visit: Payer: Self-pay

## 2024-02-27 ENCOUNTER — Other Ambulatory Visit (HOSPITAL_COMMUNITY): Payer: Self-pay

## 2024-03-03 ENCOUNTER — Other Ambulatory Visit (HOSPITAL_COMMUNITY): Payer: Self-pay

## 2024-03-03 ENCOUNTER — Other Ambulatory Visit: Payer: Self-pay

## 2024-03-03 NOTE — Telephone Encounter (Signed)
 Patient LVM asking how to refill pirfenidone . Chart review shows he was contacted by [facility]. I attempted to return call, left HIPAA compliant VM with my number to return call if any questions.

## 2024-03-03 NOTE — Progress Notes (Signed)
 Specialty Pharmacy Refill Coordination Note  Spoke with Shaun [name] is a 68 y.o. male contacted today regarding refills of specialty medication(s) Pirfenidone   Doses on hand: 3 days per [facility]  Patient requested: Delivery   Delivery date: 03/05/24   Verified address: [address] [name] [address]  Medication will be filled on 03/04/24

## 2024-03-05 LAB — JAK2 V617F, REFLEX TO E12-15: Reflex: 15

## 2024-03-05 LAB — E12 - E15 (REFLEXED)

## 2024-03-06 ENCOUNTER — Ambulatory Visit: Payer: Self-pay | Admitting: Internal Medicine

## 2024-03-06 ENCOUNTER — Other Ambulatory Visit

## 2024-03-06 ENCOUNTER — Ambulatory Visit: Payer: Self-pay

## 2024-03-06 DIAGNOSIS — Z5181 Encounter for therapeutic drug level monitoring: Secondary | ICD-10-CM | POA: Diagnosis not present

## 2024-03-06 LAB — HEPATIC FUNCTION PANEL
ALT: 15 U/L (ref 0–53)
AST: 24 U/L (ref 0–37)
Albumin: 4.2 g/dL (ref 3.5–5.2)
Alkaline Phosphatase: 61 U/L (ref 39–117)
Bilirubin, Direct: 0.1 mg/dL (ref 0.0–0.3)
Total Bilirubin: 0.4 mg/dL (ref 0.2–1.2)
Total Protein: 6.5 g/dL (ref 6.0–8.3)

## 2024-03-06 NOTE — Telephone Encounter (Signed)
-----   Message from [name] sent at 03/06/2024  9:19 AM EST ----- Called but no answer, left message.  Findings were negative for JAK2 including reflex mutation related primary polycythemia.  No other concerning findings.  Polycythemia secondary primary pulmonary  illness.  Continue follow-up with PCP and pulmonology is recommended.  Let us  know if any new concerns.  Fyi Dr. Geronimo. Thanks.  ----- Message ----- From: Rebecka, Lab In [place] Sent: 02/21/2024   4:01 PM EST To: [name], MD

## 2024-03-20 ENCOUNTER — Telehealth: Payer: Self-pay

## 2024-03-20 NOTE — Telephone Encounter (Signed)
 Returned patient's VM regarding medication access in the new year.   He was told by someone with his insurance company that Tyvaso  will not be covered in the new year. He wasn't sure this is accurate. Denies insurance coverage changes. Per our last PA in February, coverage is authorized through 2099.   He hit max OOP early this year due to use of Ofev . All copays currently $0. He is unsure what cost will look like in the new year. Advised that new OOP max in 2026 is $2100. Alternatively, we can consider cost assistance through manufacturer in the new year - will require test claim to show copay at that time.   He verbalizes understanding; will contact us  in January to discuss.

## 2024-03-26 ENCOUNTER — Other Ambulatory Visit: Payer: Self-pay

## 2024-03-28 ENCOUNTER — Other Ambulatory Visit: Payer: Self-pay

## 2024-03-28 NOTE — Progress Notes (Signed)
 Specialty Pharmacy Refill Coordination Note  Shaun [name] is a 68 y.o. male contacted today regarding refills of specialty medication(s) Pirfenidone    Patient requested Delivery   Delivery date: 04/02/24   Verified address: [address] [place] [address]   Medication will be filled on: 04/01/24

## 2024-04-07 ENCOUNTER — Ambulatory Visit: Payer: Self-pay | Admitting: Internal Medicine

## 2024-04-07 ENCOUNTER — Other Ambulatory Visit: Payer: Self-pay

## 2024-04-07 ENCOUNTER — Other Ambulatory Visit (INDEPENDENT_AMBULATORY_CARE_PROVIDER_SITE_OTHER)

## 2024-04-07 ENCOUNTER — Ambulatory Visit: Admitting: *Deleted

## 2024-04-07 DIAGNOSIS — D751 Secondary polycythemia: Secondary | ICD-10-CM

## 2024-04-07 DIAGNOSIS — J84112 Idiopathic pulmonary fibrosis: Secondary | ICD-10-CM | POA: Diagnosis not present

## 2024-04-07 DIAGNOSIS — J439 Emphysema, unspecified: Secondary | ICD-10-CM

## 2024-04-07 DIAGNOSIS — I2723 Pulmonary hypertension due to lung diseases and hypoxia: Secondary | ICD-10-CM

## 2024-04-07 DIAGNOSIS — Z5181 Encounter for therapeutic drug level monitoring: Secondary | ICD-10-CM

## 2024-04-07 LAB — PULMONARY FUNCTION TEST
DL/VA % pred: 45 %
DL/VA: 1.88 ml/min/mmHg/L
DLCO cor % pred: 36 %
DLCO cor: 8.83 ml/min/mmHg
DLCO unc % pred: 39 %
DLCO unc: 9.35 ml/min/mmHg
FEF 25-75 Pre: 3.39 L/s
FEF2575-%Pred-Pre: 147 %
FEV1-%Pred-Pre: 101 %
FEV1-Pre: 3.01 L
FEV1FVC-%Pred-Pre: 110 %
FEV6-%Pred-Pre: 96 %
FEV6-Pre: 3.63 L
FEV6FVC-%Pred-Pre: 106 %
FVC-%Pred-Pre: 91 %
FVC-Pre: 3.67 L
Pre FEV1/FVC ratio: 82 %
Pre FEV6/FVC Ratio: 100 %

## 2024-04-07 LAB — HEPATIC FUNCTION PANEL
ALT: 17 U/L (ref 3–53)
AST: 26 U/L (ref 5–37)
Albumin: 4.2 g/dL (ref 3.5–5.2)
Alkaline Phosphatase: 68 U/L (ref 39–117)
Bilirubin, Direct: 0.1 mg/dL (ref 0.1–0.3)
Total Bilirubin: 0.4 mg/dL (ref 0.2–1.2)
Total Protein: 6.9 g/dL (ref 6.0–8.3)

## 2024-04-07 NOTE — Progress Notes (Signed)
 Spirometry and diffusion capacity performed today.

## 2024-04-07 NOTE — Patient Instructions (Signed)
 Spirometry and diffusion capacity performed today.

## 2024-04-17 NOTE — Progress Notes (Unsigned)
 "      2025  Subjective:  Patient ID: Shaun [name], male , DOB: 08-14-1955 , age 69 y.o. , MRN: [id number] , ADDRESS: [address] [place] [address] PCP Loreli Kins, MD Patient Care Team: Loreli Kins, MD as PCP - General (Family Medicine) Hunsucker, Donnice SAUNDERS, MD as Consulting Physician (Pulmonary Disease)  This Provider for this visit: Treatment Team:  Attending Provider: Geronimo Amel, MD    07/23/2023 -   Chief Complaint  Patient presents with   Follow-up    PFT done today. Breathing is stable.      HPI Shaun [name] 69 y.o. -returns for followup. Remains on ofev  100mg  bid - did not try carob flour. Found it dificult to mix but also low dose ofev  dose is producing diarrhea. Also doing Tyvaso  DPI 48mcg. No side effets. FVC appears stable today. Symptms socre and exercise test is stable.Need sLFT check today. He had few othre questions. Below. Otherwise, Interim Health status: No new complaints No new medical problems. No new surgeries. No ER visits. No Urgent care visits. No changes to medications   A) if tyvaso  can cause erectile dysfunction - have reached out to pharmacy team  B) he is also pursuing workmans compe - he worked for [employer]. Did carpentry and consturction wrk, .  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 Explained to him this is definite risk factor for IPF and is consistent with fibrotic lung diseases developing years/decades aftrer exposure. Supported him with workman comp      OV 12/06/2023  Subjective:  Patient ID: Shaun [name], male , DOB: 08/09/1955 , age 52 y.o. , MRN: [id number] , ADDRESS: [address] [place] [address] PCP Loreli Kins, MD Patient Care Team: Loreli Kins, MD as PCP - General (Family Medicine) Hunsucker, Donnice SAUNDERS, MD as Consulting Physician (Pulmonary Disease)  This Provider for this visit: Treatment Team:  Attending Provider: Geronimo Amel,  MD  12/06/2023 -   Chief Complaint  Patient presents with   Medical Management of Chronic Issues   Interstitial Lung Disease    PFT repeated today. Breathing has been worse over the past 2 months.      HPI Shaun [name] 69 y.o. -returns for follow-up.  Last seen in April 2025.  Since then he says for the last 2 to 3 months he is getting worse.  He goes for daily walks and it is taking longer for him.  He does not have portable oxygen.  He is compliant with his low-dose nintedanib.  He is also taking Tyvaso  48 mcg DPI and is tolerating it well.  But he is just getting more exhausted.  There is no chest pain there is no edema there is no cough there is no wheezing there is no paroxysmal nocturnal dyspnea.  No hospitalizations.  Symptom score below in those objectively is complaining is worse symptom scores seem relatively stable  Exercise hypoxemia test though shows significant decline.  He required 4 L nasal oxygen to correct.  His pulmonary function test shows some decline in [facility] but is still normal range but a lot steep decline in DLCO. -Raising the possibility of either worsening pulmonary hypertension or pulmonary fibrosis or anemia.   In the past he has been reluctant for lung transplant evaluation but I counseled him at this time he is more open to it.   OV 01/11/2024  Subjective:  Patient ID: Shaun [name], male , DOB: December 28, 1955 , age  2 y.o. , MRN: [id number] , ADDRESS: [address] [place] [facility] [address] PCP Loreli Kins, MD Patient Care Team: Loreli Kins, MD as PCP - General (Family Medicine) [facility], Donnice SAUNDERS, MD as Consulting Physician (Pulmonary Disease)  This Provider for this visit: Treatment Team:  Attending Provider: Geronimo Amel, MD  01/11/2024 -   Chief Complaint  Patient presents with   Interstitial Lung Disease    Pt states since LOV breathing is about the same  SOB occurs when when going up stairs Prod cough ( clear phlegm)       HPI Shaun [name] 69 y.o. -returns for a quick follow-up after completing workup he was getting more dyspneic and also having excess hypoxemia.  Therefore we did differentiate if this was pulmonary fibrosis getting worse or he was getting refractory pulmonary hypertension.  His high-resolution CT chest that showed that his pulmonary fibrosis getting worse.  Both echocardiogram and BNP are normal thus lowering the odds that his pulmonary hypertension is getting worse.  Of note that he is on low-dose nintedanib protocol and is also on Tyvaso  DPI for WHO group 3 pulmonary hypertension.  However his labs showed persistent polycythemia which has had for 1 year with a hemoglobin of 17 g%.  In terms of his care plan - I referred him to [facility] lung transplant unit.  He spoke to Engineering Geologist.  It appears that his wife is not fully on board giving him 10 support in the transplant phase.  She has a lot of commitments.  His assistant was offered to help.  He also listened about the risks and benefits about lung transplant.  He says he is not fully invested in it right now and wants to hold off.  I supported him in this decision making.   -New polycythemia: Hemoglobin is high.  I suspect this is from hypoxemia but will refer him to hematology  -Pulmonary hypertension: This appears to be stable therefore we will continue to watch him with Tyvaso  DPI.  Early next year we could consider clinical trial  -IPF: This is getting worse.  He is on low-dose nintedanib.  He therefore qualifies for newly approved Nerandomilast.  However the paperwork for this has not been set up.  This because it was only approved 3 days ago.  I have therefore messaged our in-house pharmacy team to work on this over the next several weeks.  I explained to him about the side effects particularly GI including diarrhea abdominal pain.  He he wants to consider this is an add-on therapy  -Symptoms: Symptom score in excess approximately  tests are all documented below.  - Ofev  monitoring: LFT normal Sept 2025. Next check 3 months  CT Chest data from dat8/24/25  - personally visualized and independently interpreted : yes - my findings are: as below   IMPRESSION: 1. Pulmonary parenchymal pattern of interstitial lung disease, as described above, minimally progressive from 03/03/2022. Findings are consistent with UIP per consensus guidelines: Diagnosis of Idiopathic Pulmonary Fibrosis: An Official ATS/ERS/JRS/ALAT Clinical Practice Guideline. Am JINNY Honey Crit Care Med Vol 198, Iss 5, [phone], Dec 02 2016. 2.  Aortic atherosclerosis (ICD10-I70.0). 3.  Emphysema (ICD10-J43.9).     Electronically Signed   By: Newell Eke M.D.   On: 12/27/2023 14:53  OV 04/18/2024  Subjective:  Patient ID: Shaun [name], male , DOB: [birth year] , age 53 y.o. , MRN: [id number] , ADDRESS: [address] [place] [address] PCP Loreli Kins, MD Patient Care Team: Loreli,  Joen, MD as PCP - General (Family Medicine) Hunsucker, Donnice SAUNDERS, MD as Consulting Physician (Pulmonary Disease)  This Provider for this visit: Treatment Team:  Attending Provider: Geronimo Amel, MD    Emphysma = alpha-1 MM   IPF diagnosed in November 2017.   - Family of pulmonary fibrosis - last CT March 2020  - emphysema > Fibrosis -Repeat CT scan of the chest December 2023: With progression and significant emphysema present. - IPF-Pro registre -Low-dose nintedanib since Oct 2023 due to diarrhea -chaged to esbriet  fal 2025 - declined transplant eval summer/fall 2025  Combined pulmonary fibrosis and emphysema - with fibrosis much biggter component and epysema only mild -  due to fev/51fc fation > 70 at all time  WHO group 3 pulmonary hypertension diagnosed 06/16/2022 - : Mean PAP 26, PCWP 7, PVR 3.1 -> tuyvaso nebs 07/12/22 -> changed to DPI at 40 mcg dose in July 2024    Saw counselor r September 12, 2022 for adjustment disorder with  anxious mood.  Therapeutic drug monitoring  - Esbriet /Pirfenidone  requires intensive drug monitoring due to high concerns for Adverse effects of , including  Drug Induced Liver Injury, significant GI side effects that include but not limited to Diarrhea, Nausea, Vomiting,  and other system side effects that include Fatigue, headaches, weight loss and other side effects such as skin rash. These will be monitored with  blood work such as LFT initially once a month for 6 months and then quarterly   - tyvaso  dPI -    04/18/2024 -   Chief Complaint  Patient presents with   Interstitial Lung Disease    Pt states since LOV breathing has been the same SOB occurs w/ exertion      HPI Shaun [name] 69 y.o. -NEVYN BOSSMAN is a 69 year old male with idiopathic pulmonary fibrosis and pulmonary hypertension who presents for follow-up regarding his medication regimen and insurance coverage.  He has been managing idiopathic pulmonary fibrosis and pulmonary hypertension with Esbriet  and Tyvaso . He started Esbriet  in October and has tolerated it well without digestive issues, unlike Ofev , which previously caused diarrhea. He has not been using sunscreen due to cold weather and limited outdoor activity.  He uses oxygen therapy intermittently, primarily with a portable concentrator. He has a large home concentrator and a spare tank. He experiences discomfort with the nasal cannula due to allergies and is considering a full mask for better comfort, though he has not used it extensively.  Regarding medication coverage, he mentioned that the assistance fund previously covered his IPF medication costs, but he now faces potential out-of-pocket expenses for Tyvaso  due to changes in Medicaid rules. He plans to discuss cost assistance options with Aleck, a contact for medication cost assistance.  He saw a hematologist in November for high hemoglobin levels, and the results were essentially normal with no  significant concerns noted. He has not experienced any notable side effects from his current medications.     SYMPTOM SCALE - ILD 8/28/202 0 140# 12/22/2020 138# 01/03/2022 142# 03/21/2022 144# low dose ofev  since oct- nov 2023 05/15/2022 Low dose ofev  06/26/22 08/08/2022 Low dose ofev  + tyvaso  since 07/12/22 11/14/2022 Low dose ofev  + tyvasos dpi 07/23/2023 Low doseofe + tyvaso  DPI 01/11/2024 Low-dose nintedanib plus TYVASO   04/18/2024 Full doses pirfenidone   Tyvaso     O2 use Room air ra ra ra ra ra ra ra Room air  Uses portable oxygen as needed  Shortness of Breath 0 -> 5 scale with  5 being worst (score 6 If unable to do)            At rest 0 0 0 0 0 0 0 0 0 0  0  Simple tasks - showers, clothes change, eating, shaving 0 0 0 0 0 0 00 0 0 0  0  Household (dishes, doing bed, laundry) 0 0 0  0 0 0 0 0 0 0  Shopping 0 0 0 00 0 0 0 0 0 0  0  Walkin sel pac 0 0 0 1 1 0 1 0 0 1  0  Walking up Stairs 1 1 1 2 2 2 2 1 2 2 2   Total (40 - 48) Dyspnea Score 1 1 1 3 3 3 3 1 2 3 2   How bad is your cough? 0 0 0 0 1 1 2 0 0 1  0  How bad is your fatigue 0 1 1 1 1 0 1 1 1 1  1  nausea  0 0 0 0 0 0 0 0 0 0   vomit  0 0 0 0 0 0 00 0 0 0   diarhea  1 2 1 1 1 1  0 00 1 0  anxity  1 1 1 1 1 2  00 0 1 1  depresion  0 1 1 1  x 2 0 0 1 1          SIT STAND TEST - goal 15 times   07/23/2023  12/06/2023  01/11/2024  04/18/2024   O2 used ra ra Room air Room air  PRobe - finter or forehead ?  Finger Finger  Number sit and stand completed - goal 15 15 15 15  times 15 times  Time taken to complete 48 sec 50 sec 46 seconds 44.3 seconds  Resting Pulse Ox/HR/Dyspnea  96% and 81/min and dyspnea of 1/10  93% and HR 81 97% with a heart rate of 91 and score of 0 96% with heart rate of 85 and score of 0  Peak measures 93 % and 102/min and dyspnea of 2/10 89% and HR 103 94% with a heart rate of 103 and score _0 96% with a heart rate of 96 and score 2  Final Pulse Ox/HR 94% and 77/min and dyspnea of x/10 97% and HR 90 92%  with a heart rate of 89 and a score of 1 94% and a heart rate of 79 score 1  Desaturated </= 88% no almsot No no  Desaturated <= 3% points yes yes Yes no  Got Tachycardic >/= 90/min yes yes Yes yes  Miscellaneous comments x Dyspnea score 3 worset Similar to April and better than September Improved performance mildly.     PFT     Latest Ref Rng & Units 04/07/2024    9:49 AM 12/06/2023   10:48 AM 07/23/2023    7:53 AM 03/15/2023    2:41 PM 11/14/2022   11:13 AM 06/13/2022   10:25 AM 05/15/2022    9:10 AM  PFT Results  FVC-Pre L 3.67  3.42  3.65  3.57  3.74  3.77  3.69   FVC-Predicted Pre % 91  85  91  88  92  95  93   Pre FEV1/FVC % % 82  84  86  85  84  85  78   FEV1-Pre L 3.01  2.87  3.15  3.03  3.14  3.20  2.86   FEV1-Predicted Pre % 101  97  106  101  105  109  97   DLCO uncorrected ml/min/mmHg 9.35  8.55  12.02  10.56  11.81  11.57  12.96   DLCO UNC% % 39  35  50  43  49  48  54   DLCO corrected ml/min/mmHg 8.83  8.55  12.02  10.56  11.81  11.57  12.96   DLCO COR %Predicted % 36  35  50  43  49  48  54   DLVA Predicted % 45  53  63  52  61  60  57   TLC L      5.44    TLC % Predicted %      86    RV % Predicted %      94         LAB RESULTS last 96 hours No results found.       has a past medical history of Allergy (in childhood), Dyspnea, Emphysema of lung (HCC) (Nov. 2017), Family history of colon cancer, Family history of prostate cancer, Family history of pulmonary fibrosis, Hyperlipemia, and Pneumonia.   reports that he has never smoked. He has never used smokeless tobacco.  Past Surgical History:  Procedure Laterality Date   COLONOSCOPY WITH PROPOFOL  N/A 09/21/2014   Procedure: COLONOSCOPY WITH PROPOFOL ;  Surgeon: Gladis MARLA Louder, MD;  Location: [facility];  Service: Endoscopy;  Laterality: N/A;   FOREARM SURGERY     excision of birth markchild   HERNIA REPAIR  2007   RIH-open   KNEE ARTHROTOMY Left    bursa excision   LUNG BIOPSY Right 02/02/2016    Procedure: LUNG BIOPSY;  Surgeon: Elspeth JAYSON Millers, MD;  Location: [facility] OR;  Service: Thoracic;  Laterality: Right;   NASAL SEPTUM SURGERY     RIGHT HEART CATH N/A 06/16/2022   Procedure: RIGHT HEART CATH;  Surgeon: Rolan Ezra RAMAN, MD;  Location: [facility] INVASIVE CV LAB;  Service: Cardiovascular;  Laterality: N/A;   VIDEO ASSISTED THORACOSCOPY Right 02/02/2016   Procedure: VIDEO ASSISTED THORACOSCOPY;  Surgeon: Elspeth JAYSON Millers, MD;  Location[facility] OR;  Service: Thoracic;  Laterality: Right;   WRIST GANGLION EXCISION Left     Allergies[1]  Immunization History  Administered Date(s) Administered   Fluad Quad(high Dose 65+) 12/02/2020, 01/03/2022   Fluzone Influenza virus vaccine,trivalent (IIV3), split virus 01/13/2011, 01/13/2013, 12/01/2013, 12/04/2014, 11/13/2015, 12/09/2016   INFLUENZA, HIGH DOSE SEASONAL PF 12/12/2023   Influenza Split 01/02/2012   Influenza,inj,Quad PF,6+ Mos 12/09/2016, 11/26/2017, 11/29/2018, 12/31/2019   Influenza-Unspecified 11/02/2015, 12/09/2016, 12/03/2022   Moderna SARS-COV2 Booster Vaccination 01/31/2020   Moderna Sars-Covid-2 Vaccination 06/05/2019, 07/08/2019, 01/31/2020, 07/05/2020   Pneumococcal Conjugate-13 02/26/2018   Pneumococcal Polysaccharide-23 02/05/2016   Respiratory Syncytial Virus Vaccine,Recomb Aduvanted(Arexvy) 01/10/2022   Td 07/12/2017   Td (Adult) 08/01/1997   Tdap 12/10/2007   Zoster Recombinant(Shingrix) 12/09/2016, 02/10/2017   Zoster, Live 05/05/2015, 11/02/2015, 12/09/2016, 02/10/2017    Family History  Problem Relation Age of Onset   Lung cancer Mother    Colon cancer Mother 65   Pulmonary fibrosis Father        dx in his 30s   Prostate cancer Brother 65   COPD Paternal Grandfather    Other Paternal Uncle        possible lung dx; died around age 24   Other Cousin        possible lung dx, died in her 28s; paternal first cousin   Healthy Son     Current Medications[2]      Objective:  Vitals:   04/18/24 0827   BP: 126/64  Pulse: 83  Temp: 97.9 F (36.6 C)  TempSrc: Oral  SpO2: 97%  Weight: 146 lb 3.2 oz (66.3 kg)  Height: 5' 7 (1.702 m)    Estimated body mass index is 22.9 kg/m as calculated from the following:   Height as of this encounter: 5' 7 (1.702 m).   Weight as of this encounter: 146 lb 3.2 oz (66.3 kg).  @WEIGHTCHANGE @  [employer]   04/18/24 0827  Weight: 146 lb 3.2 oz (66.3 kg)     Physical Exam   General: No distress. Looks wel O2 at rest: no Cane present: no Sitting in wheel chair: no Frail: no Obese: no Neuro: Alert and Oriented x 3. GCS 15. Speech normal Psych: Pleasant Resp:   No overt respiratory distress HEENT:looks normal   LFTs: Within normal limits  Diagnostic Pulmonary function testing (04/11/2024): Stable compared to September 2025; slight improvement from prior study     Assessment/     Assessment & Plan IPF (idiopathic pulmonary fibrosis) (HCC)  Encounter for therapeutic drug monitoring  WHO group 3 pulmonary arterial hypertension (HCC)  Pulmonary emphysema (HCC)    PLAN Patient Instructions  Exerrcise hypoxemia onset 12/06/2023  - seems atleast in office simple sit stand test o2 holds up.   Plan - cotninue 4L [place] portable oxygen as needed; keep > 88%     IPF (idiopathic pulmonary fibrosis) (HCC) Familial idiopathic pulmonary fibrosis (HCC) Therapeutic drug monitoring   - Concern for progression based on CT late 2025 while on low dose nintedanib  - On 04/18/2024 - PFT with short term stability - Since Oct 2025 on esbriet  and tolerating it well as of 04/18/2024 - LFT normal Jan 2026 - not doing Jascayd because insurance wanted you to try esbriet  fist  Plan  -cotninue esbriet   - check LFT in 3 months  - apply sunscreen     Pulmonary emphysema, unspecified emphysema type (HCC)  -You to have associated emphysema but this is the much minor component because fev1/fvc ratio always > 70 - curently stable  Plan -  Continue Spiriva  Respimat samples 2.5 mcg dose and take it 2 puffs once daily  WHO-3 pulmonary hypertension - new diagnosis 06/16/22; started tyvaso  07/12/22   Tolerating tyvaso  DPI 48 stregth -> BNP Nromal Sept 2025/Echo normal Pct 2025 Tyvasos is expensive for you   Plan  - continue tyvaso  dpi as before - Take consent for DeciPHER registry program 04/18/2024 - talk to [facility] Pharm D about co-pay assistane     Polycythemia  -  high 17gm% since march 2024 -> Sept 2025  - ressured by hematology  Plan  -per hematology  plan   - Followup - 16 weeks Jaana Brodt after spiro/dlco for 30 min visit    FOLLOWUP    Return in about 16 weeks (around 08/08/2024) for 30 min visit, after Spiro and DLCO, with Dr Geronimo, Face to Face Visit.    SIGNATURE    Dr. Dorethia Geronimo, M.D., F.C.C.P,  Pulmonary and Critical Care Medicine Staff Physician, [facility] Director - Interstitial Lung Disease  Program  Pulmonary Fibrosis [facility] at [facility] [place], [place], [address]  Pager: [phone], If no answer or between  15:00h - 7:00h: call [phone] Telephone: [phone]  9:06 AM 04/18/2024     [1]  Allergies Allergen Reactions   Penicillins Other (See Comments)    UNSPECIFIED  REACTION OF CHILDHOOD Has patient had a PCN reaction causing immediate rash, facial/tongue/throat swelling, SOB or lightheadedness with hypotension:unsure Has patient had a PCN reaction causing severe rash involving mucus membranes or skin necrosis:unsure Has patient had a PCN reaction that required hospitalization:No Has patient had a PCN reaction occurring within the last 10 years:No If all of the above answers are NO, then may proceed with Cephalosporin use.   [2]  Current Outpatient Medications:    loratadine (CLARITIN) 10 MG tablet, Take 10 mg by mouth daily., Disp: , Rfl:    Multiple Vitamin (MULTIVITAMIN WITH MINERALS) TABS tablet,  Take 1 tablet by mouth daily., Disp: , Rfl:    Pirfenidone  267 MG TABS, Take 3 tablets (801mg ) by mouth three times daily with food., Disp: 270 tablet, Rfl: 1   pseudoephedrine (SUDAFED) 120 MG 12 hr tablet, Take 120 mg by mouth every 12 (twelve) hours as needed for congestion., Disp: , Rfl:    simvastatin  (ZOCOR ) 10 MG tablet, Take 10 mg by mouth daily at 6 PM. , Disp: , Rfl: 0   TYVASO  DPI MAINTENANCE KIT 48 MCG POWD, Inhale 48 mcg into the lungs in the morning, at noon, in the evening, and at bedtime., Disp: 112 each, Rfl: 5  "

## 2024-04-17 NOTE — Patient Instructions (Incomplete)
 Exerrcise hypoxemia onset 12/06/2023  - stable. Due to IPF getting worse   Plan - cotninue 4L [place] portable oxygen; keep > 88%     IPF (idiopathic pulmonary fibrosis) (HCC) Familial idiopathic pulmonary fibrosis (HCC) Therapeutic drug monitoring   - Concern for progression based on CT; currently on low dose nintedanib  - Noted you want to hold off transplant evaluation due to high family demand  Plan  -Continue ofev  low-dose protocol at 100 mg twice daily -Consider adding on  Nerandomilast    - sending message to pharmacy team    Pulmonary emphysema, unspecified emphysema type (HCC)  -You to have associated emphysema but this is the much minor component because fev1/fvc ratio always > 70 - curently stable  Plan - Continue Spiriva  Respimat samples 2.5 mcg dose and take it 2 puffs once daily  WHO-3 pulmonary hypertension - new diagnosis 06/16/22; started tyvaso  07/12/22  Tolerating tyvaso  DPI 48 stregth -> BNP Nromal Sept 2025/Echo normal Pct 2025  Plan  - - continue tyvaso  dpi as before    Polycythemia  -  high 17gm% since march 2024 -> Sept 2025  Plan  -refer hematology  plan   - Followup - 12 weeks Atlas Kuc after spiro/dlco for 30 min visit

## 2024-04-18 ENCOUNTER — Ambulatory Visit (INDEPENDENT_AMBULATORY_CARE_PROVIDER_SITE_OTHER): Admitting: Internal Medicine

## 2024-04-18 ENCOUNTER — Encounter: Payer: Self-pay | Admitting: Internal Medicine

## 2024-04-18 VITALS — BP 126/64 | HR 83 | Temp 97.9°F | Ht 67.0 in | Wt 146.2 lb

## 2024-04-18 DIAGNOSIS — J439 Emphysema, unspecified: Secondary | ICD-10-CM

## 2024-04-18 DIAGNOSIS — I2723 Pulmonary hypertension due to lung diseases and hypoxia: Secondary | ICD-10-CM | POA: Diagnosis not present

## 2024-04-18 DIAGNOSIS — J849 Interstitial pulmonary disease, unspecified: Secondary | ICD-10-CM

## 2024-04-18 DIAGNOSIS — J84112 Idiopathic pulmonary fibrosis: Secondary | ICD-10-CM | POA: Diagnosis not present

## 2024-04-18 DIAGNOSIS — Z5181 Encounter for therapeutic drug level monitoring: Secondary | ICD-10-CM | POA: Diagnosis not present

## 2024-04-24 ENCOUNTER — Other Ambulatory Visit: Payer: Self-pay | Admitting: Internal Medicine

## 2024-04-24 ENCOUNTER — Other Ambulatory Visit (HOSPITAL_COMMUNITY): Payer: Self-pay

## 2024-04-24 DIAGNOSIS — J84112 Idiopathic pulmonary fibrosis: Secondary | ICD-10-CM

## 2024-04-25 ENCOUNTER — Other Ambulatory Visit: Payer: Self-pay

## 2024-04-28 ENCOUNTER — Other Ambulatory Visit: Payer: Self-pay

## 2024-04-29 ENCOUNTER — Ambulatory Visit: Attending: Internal Medicine

## 2024-04-29 ENCOUNTER — Other Ambulatory Visit: Payer: Self-pay

## 2024-04-29 ENCOUNTER — Other Ambulatory Visit (HOSPITAL_COMMUNITY): Payer: Self-pay

## 2024-04-29 DIAGNOSIS — J84112 Idiopathic pulmonary fibrosis: Secondary | ICD-10-CM

## 2024-04-29 MED ORDER — PIRFENIDONE 801 MG PO TABS
801.0000 mg | ORAL_TABLET | Freq: Three times a day (TID) | ORAL | 3 refills | Status: AC
  Filled 2024-04-30 (×2): qty 90, 30d supply, fill #0

## 2024-04-29 NOTE — Telephone Encounter (Signed)
 Refill addressed in pharmacotherapy visit note 04/29/24.

## 2024-04-29 NOTE — Addendum Note (Signed)
 Addended by: Aftyn Nott L on: 04/29/2024 05:28 PM   Modules accepted: Orders

## 2024-04-29 NOTE — Progress Notes (Cosign Needed)
 [facility]  Referring Provider: Dr. Geronimo  Virtual Visit via Telephone Note  I connected with [name] on 04/29/24 at 5:20 PM by telephone and verified that I am speaking with the correct person using two identifiers.  Location: Patient: home Provider: office   I discussed the limitations, risks, security and privacy concerns of performing an evaluation and management service by telephone and the availability of in person appointments. I also discussed with the patient that there may be a patient responsible charge related to this service. The patient expressed understanding and agreed to proceed.  HPI: Shaun [name] is a 69 y.o. male who presents to the pharmacotherapy clinic via telephone for follow-up pirfenidone  education and to answer questions about cost of Tyvaso .  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   MULTIPLE VITAMIN (MULTIVITAMIN WITH MINERALS) TABS TABLET    Take 1 tablet by mouth daily.   PIRFENIDONE  267 MG TABS    Take 3 tablets (801mg ) by mouth three times daily with food.   PSEUDOEPHEDRINE (SUDAFED) 120 MG 12 HR TABLET    Take 120 mg by mouth every 12 (twelve) hours as needed for congestion.   SIMVASTATIN  (ZOCOR ) 10 MG TABLET    Take 10 mg by mouth daily  at 6 PM.    TYVASO  DPI MAINTENANCE KIT 48 MCG POWD    Inhale 48 mcg into the lungs in the morning, at noon, in the evening, and at bedtime.  Modified Medications   No medications on file  Discontinued Medications   No medications on file    Allergies: Allergies[1]  Past Medical History: Past Medical History:  Diagnosis Date   Allergy in childhood   Dyspnea    Emphysema of lung (HCC) Nov. 2017   Family history of colon cancer    Family history of prostate cancer    Family history of pulmonary fibrosis    Hyperlipemia    Pneumonia     Social History: Social History   Socioeconomic History   Marital status: Married    Spouse name: Not on file   Number of children: Not on file   Years of education: Not on file   Highest education level: Not on file  Occupational History   Not on file  Tobacco Use   Smoking status: Never   Smokeless tobacco: Never  Substance and Sexual Activity   Alcohol use: No    Alcohol/week: 0.0 standard drinks of alcohol   Drug use: No   Sexual activity: Not on file  Other Topics Concern   Not on file  Social History Narrative   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    Social Drivers of Health   Tobacco Use: Low Risk (04/18/2024)   Patient History    Smoking Tobacco Use: Never  Smokeless Tobacco Use: Never    Passive Exposure: Not on file  Financial Resource Strain: Not on file  Food Insecurity: No Food Insecurity (02/21/2024)   Epic    Worried About Programme Researcher, Broadcasting/film/video in the Last Year: Never true    Ran Out of Food in the Last Year: Never true  Transportation Needs: No Transportation Needs (02/21/2024)   Epic    Lack of Transportation (Medical): No    Lack of Transportation (Non-Medical): No  Physical Activity: Not on file  Stress: Not on file  Social Connections: Not on file  Depression (PHQ2-9): Low Risk (02/21/2024)   Depression (PHQ2-9)    PHQ-2 Score: 0  Alcohol Screen: Not on file  Housing: Low Risk (02/21/2024)    Epic    Unable to Pay for Housing in the Last Year: No    Number of Times Moved in the Last Year: 0    Homeless in the Last Year: No  Utilities: Not At Risk (02/21/2024)   Epic    Threatened with loss of utilities: No  Health Literacy: Not on file    Medication: Esbriet  Medication Management Thoroughly counseled patient on the efficacy, mechanism of action, dosing, administration, adverse effects, and monitoring parameters of Esbriet .  Patient verbalized understanding.   Goals of Therapy: Will not stop or reverse the progression of ILD. It will slow the progression of ILD.   Dosing: Starting dose will be Esbriet  267 mg 1 tablet three times daily for 7 days, then 2 tablets three times daily for 7 days, then 3 tablets three times daily.  Maintenance dose will be 801 mg 1 tablet three times daily if tolerated.  Stressed the importance of taking with meals and space at least 5-6 hours apart to minimize stomach upset.   Adverse Effects: Nausea, vomiting, diarrhea, weight loss Abdominal pain GERD Sun sensitivity/rash - patient advised to wear sunscreen when exposed to sunlight Dizziness Fatigue  Monitoring: Monitor for diarrhea, nausea and vomiting, GI perforation, hepatotoxicity  Monitor LFTs - baseline, monthly for first 6 months, then every 3 months routinely  Access: Approval of Esbriet  through: insurance Rx sent to: [facility]: [phone]   Medication Reconciliation A drug regimen assessment was performed, including review of allergies, interactions, disease-state management, dosing and immunization history. Medications were reviewed with the patient, including name, instructions, indication, goals of therapy, potential side effects, importance of adherence, and safe use.  Plan: - Per test claims, cost of Tyvaso  and pirfenidone  is $0  - Rx for pirfenidone  801mg  tablet, Take 1 tablet TID triaged to [facility]. Patient counseled on fewer tablets per day, but same  dose.    I discussed the assessment and treatment plan with the patient. The patient was provided an opportunity to ask questions and all were answered. The patient agreed with the plan and demonstrated an understanding of the instructions.   The patient was advised to call back or seek an in-person evaluation if the symptoms worsen or if the condition fails to improve as anticipated.  I provided 9 minutes of non-face-to-face time during this encounter.  Aleck Puls, PharmD, BCPS, CPP Clinical Pharmacist  [facility]  [facility] 04/29/2024, 5:31 PM     [1]  Allergies Allergen Reactions   Penicillins Other (See Comments)    UNSPECIFIED REACTION OF CHILDHOOD Has patient had a PCN reaction causing immediate rash, facial/tongue/throat swelling, SOB or lightheadedness with hypotension:unsure Has patient had a PCN reaction causing severe rash involving mucus membranes  or skin necrosis:unsure Has patient had a PCN reaction that required hospitalization:No Has patient had a PCN reaction occurring within the last 10 years:No If all of the above answers are NO, then may proceed with Cephalosporin use.

## 2024-04-30 ENCOUNTER — Other Ambulatory Visit (HOSPITAL_COMMUNITY): Payer: Self-pay

## 2024-04-30 ENCOUNTER — Other Ambulatory Visit: Payer: Self-pay

## 2024-04-30 NOTE — Progress Notes (Signed)
 Specialty Pharmacy Refill Coordination Note  Shaun [name] is a 69 y.o. male contacted today regarding refills of specialty medication(s) Pirfenidone    Patient requested [facility] at [facility] [place] date: 04/30/24   Medication will be filled on: 04/30/24

## 2024-05-02 ENCOUNTER — Telehealth: Payer: Self-pay

## 2024-05-02 NOTE — Telephone Encounter (Signed)
 Copied from CRM [id number]. Topic: Clinical - Medication Question >> Apr 30, 2024 10:10 AM Russell PARAS wrote: Reason for CRM:   Pt contacted clinic to speak with Aleck in pharmacy in reference to conversation they had yesterday, and also about cost of medication  Requested call back   CB#  [phone]

## 2024-05-08 ENCOUNTER — Encounter: Admitting: Pulmonary Disease

## 2024-05-08 DIAGNOSIS — Z006 Encounter for examination for normal comparison and control in clinical research program: Secondary | ICD-10-CM

## 2024-05-08 NOTE — Research (Signed)
"  °  Title: A Real-world Patient Registry in Group 3 Pulmonary Hypertension Associated With Interstitial Lung Disease (PH-ILD)   Principal Investigator: Dr Adina Beals  Synopsis: [facility] is a real-world patient registry study for patients with Group 3 Pulmonary Hypertension associated with Interstitial Lung Disease. The study involves three cohorts. Cohort 1 will include patients who are not receiving inhaled Treprostinil  at the time of study enrollment. Cohort 2 will include patients who are newly initiated on Tyvaso /Tyvaso  DPI (within 60 days or less). Cohort 3 will include patients who have been receiving Tyvaso /Tyvaso  DPI for greater than 60 days. Cohort 4 will include patients that have had a prior right heart catheterization not meeting the definition of Pulmonary Hypertension, but has a pulmonary artery wedge pressure less than or equal to 15 mmHg.  Protocol # [id number]; [id number] # [id number]  Sponsor: [employer] [address] [place], [place] [address]  Key eligibility criteria:  Inclusion criteria:  Adults aged 36 years or older, diagnosis of ILD by traditional HRCT determined by the site/institution that conducted the HRCT, patients with connective tissue disease must have a baseline FVC of less than 70%, and right heart catheterization confirmed pulmonary hypertension.   Exclusion criteria:  Confirmed diagnosis of Group 1,2,4, or 5 pulmonary hypertension, confirmed diagnosis of Group 3 pulmonary hypertension related to COPD or conditions that cause hypoxemia, and patients receiving Yutrepia (inhaled Treprostinil  at Baseline).  Key points:  Patients who enroll will be followed for up to 5 years after study enrollment. Assessments are repeated every 6 months for up to 5 years, with a 45-day window. Co-enrollment in other observational or interventional studies is permitted.   [facility] [place] Clinical Research Coordinator note:   This visit for [name]  with DOB: May 11, 1955 on 05/08/2024 for the above protocol is Visit/Encounter # 1 and is for purpose of research.    The Subject ID for the study is [id number]  The consent for this encounter is under Protocol Version Amendment 1, Investigator Brochure Version N/A, Consent Version [id number] and is currently IRB approved.   Subject expressed continued interest and consent in continuing as a study subject.   Subject confirmed that there was no change in contact information (e.g. address, telephone, email). Subject thanked for participation in research and contribution to science.   In this visit 05/08/2024 the subject will be evaluated by Principal Investigator named Dr. Donnice Beals.   This research coordinator has verified that the above investigator is up to date with his training logs.   The Subject was informed that the PI Dr. Donnice Beals continues to have oversight of the subject's visits and course through relevant discussions, reviews, and also specifically of this visit by routing of this note to the PI.  This CRC reviewed the interim progress notes in the electronic medical records: Yes  The informed consent document was reviewed with the subject prior to any study-specific procedures. All of the subject's questions and concerns were addressed prior to the signing of the document which was signed and dated by the subject at 10:12. All scheduled events for the Screening visit were completed per protocol and a tentative date was scheduled for the subject's next research visit.      Signed by  Tinnie Earnie Mulberry  Clinical Research Coordinator [facility]  [place], [place] Signature Time/Date: 11:52 AM 05/08/2024  "

## 2024-06-11 ENCOUNTER — Encounter

## 2024-07-21 ENCOUNTER — Other Ambulatory Visit

## 2024-08-12 ENCOUNTER — Encounter

## 2024-08-12 ENCOUNTER — Ambulatory Visit: Admitting: Internal Medicine
